# Patient Record
Sex: Female | Born: 1935
Health system: Southern US, Community
[De-identification: ages and names within clinical notes are randomized; demographics above are authoritative.]

## PROBLEM LIST (undated history)

## (undated) DIAGNOSIS — I10 Essential (primary) hypertension: Secondary | ICD-10-CM

## (undated) DIAGNOSIS — B029 Zoster without complications: Secondary | ICD-10-CM

## (undated) DIAGNOSIS — F0390 Unspecified dementia without behavioral disturbance: Secondary | ICD-10-CM

## (undated) HISTORY — PX: COLONOSCOPY: SHX174

## (undated) HISTORY — PX: SHOULDER SURGERY: SHX246

## (undated) HISTORY — DX: Unspecified dementia, unspecified severity, without behavioral disturbance, psychotic disturbance, mood disturbance, and anxiety: F03.90

---

## 2002-02-21 ENCOUNTER — Ambulatory Visit (HOSPITAL_COMMUNITY): Admission: RE | Admit: 2002-02-21 | Discharge: 2002-02-21 | Payer: Self-pay | Admitting: Internal Medicine

## 2002-02-21 ENCOUNTER — Encounter: Payer: Self-pay | Admitting: Internal Medicine

## 2002-11-25 ENCOUNTER — Emergency Department (HOSPITAL_COMMUNITY): Admission: EM | Admit: 2002-11-25 | Discharge: 2002-11-25 | Payer: Self-pay | Admitting: *Deleted

## 2002-11-25 ENCOUNTER — Encounter: Payer: Self-pay | Admitting: *Deleted

## 2002-11-29 ENCOUNTER — Ambulatory Visit (HOSPITAL_COMMUNITY): Admission: RE | Admit: 2002-11-29 | Discharge: 2002-11-29 | Payer: Self-pay | Admitting: Internal Medicine

## 2002-11-29 ENCOUNTER — Encounter: Payer: Self-pay | Admitting: Internal Medicine

## 2002-11-30 ENCOUNTER — Encounter: Payer: Self-pay | Admitting: Emergency Medicine

## 2002-12-01 ENCOUNTER — Inpatient Hospital Stay (HOSPITAL_COMMUNITY): Admission: EM | Admit: 2002-12-01 | Discharge: 2002-12-04 | Payer: Self-pay | Admitting: Emergency Medicine

## 2002-12-01 ENCOUNTER — Encounter: Payer: Self-pay | Admitting: Emergency Medicine

## 2003-01-28 ENCOUNTER — Encounter: Payer: Self-pay | Admitting: General Surgery

## 2003-01-31 ENCOUNTER — Encounter: Payer: Self-pay | Admitting: General Surgery

## 2003-01-31 ENCOUNTER — Inpatient Hospital Stay (HOSPITAL_COMMUNITY): Admission: RE | Admit: 2003-01-31 | Discharge: 2003-02-02 | Payer: Self-pay | Admitting: General Surgery

## 2003-01-31 ENCOUNTER — Encounter (INDEPENDENT_AMBULATORY_CARE_PROVIDER_SITE_OTHER): Payer: Self-pay | Admitting: Specialist

## 2003-02-01 ENCOUNTER — Encounter: Payer: Self-pay | Admitting: General Surgery

## 2003-03-15 ENCOUNTER — Ambulatory Visit (HOSPITAL_COMMUNITY): Admission: RE | Admit: 2003-03-15 | Discharge: 2003-03-15 | Payer: Self-pay | Admitting: Internal Medicine

## 2003-10-16 ENCOUNTER — Ambulatory Visit (HOSPITAL_COMMUNITY): Admission: RE | Admit: 2003-10-16 | Discharge: 2003-10-16 | Payer: Self-pay | Admitting: Internal Medicine

## 2003-10-23 ENCOUNTER — Emergency Department (HOSPITAL_COMMUNITY): Admission: EM | Admit: 2003-10-23 | Discharge: 2003-10-23 | Payer: Self-pay | Admitting: Emergency Medicine

## 2004-03-16 ENCOUNTER — Ambulatory Visit (HOSPITAL_COMMUNITY): Admission: RE | Admit: 2004-03-16 | Discharge: 2004-03-16 | Payer: Self-pay | Admitting: Obstetrics and Gynecology

## 2004-07-06 ENCOUNTER — Emergency Department (HOSPITAL_COMMUNITY): Admission: EM | Admit: 2004-07-06 | Discharge: 2004-07-06 | Payer: Self-pay | Admitting: Emergency Medicine

## 2004-07-21 ENCOUNTER — Ambulatory Visit (HOSPITAL_COMMUNITY): Admission: RE | Admit: 2004-07-21 | Discharge: 2004-07-21 | Payer: Self-pay | Admitting: Internal Medicine

## 2005-03-18 ENCOUNTER — Ambulatory Visit (HOSPITAL_COMMUNITY): Admission: RE | Admit: 2005-03-18 | Discharge: 2005-03-18 | Payer: Self-pay | Admitting: Internal Medicine

## 2006-03-25 ENCOUNTER — Ambulatory Visit (HOSPITAL_COMMUNITY): Admission: RE | Admit: 2006-03-25 | Discharge: 2006-03-25 | Payer: Self-pay | Admitting: Internal Medicine

## 2006-08-22 ENCOUNTER — Ambulatory Visit (HOSPITAL_COMMUNITY): Admission: RE | Admit: 2006-08-22 | Discharge: 2006-08-22 | Payer: Self-pay | Admitting: Internal Medicine

## 2007-02-01 ENCOUNTER — Ambulatory Visit (HOSPITAL_COMMUNITY): Admission: RE | Admit: 2007-02-01 | Discharge: 2007-02-01 | Payer: Self-pay | Admitting: Internal Medicine

## 2007-02-01 ENCOUNTER — Ambulatory Visit: Payer: Self-pay | Admitting: Internal Medicine

## 2007-03-27 ENCOUNTER — Ambulatory Visit (HOSPITAL_COMMUNITY): Admission: RE | Admit: 2007-03-27 | Discharge: 2007-03-27 | Payer: Self-pay | Admitting: Internal Medicine

## 2007-04-19 ENCOUNTER — Other Ambulatory Visit: Admission: RE | Admit: 2007-04-19 | Discharge: 2007-04-19 | Payer: Self-pay | Admitting: Obstetrics and Gynecology

## 2007-08-04 ENCOUNTER — Emergency Department (HOSPITAL_COMMUNITY): Admission: EM | Admit: 2007-08-04 | Discharge: 2007-08-04 | Payer: Self-pay | Admitting: Emergency Medicine

## 2008-03-27 ENCOUNTER — Ambulatory Visit (HOSPITAL_COMMUNITY): Admission: RE | Admit: 2008-03-27 | Discharge: 2008-03-27 | Payer: Self-pay | Admitting: Internal Medicine

## 2008-05-13 ENCOUNTER — Emergency Department (HOSPITAL_COMMUNITY): Admission: EM | Admit: 2008-05-13 | Discharge: 2008-05-13 | Payer: Self-pay | Admitting: Emergency Medicine

## 2008-05-13 ENCOUNTER — Encounter: Payer: Self-pay | Admitting: Orthopedic Surgery

## 2008-05-14 ENCOUNTER — Ambulatory Visit: Payer: Self-pay | Admitting: Orthopedic Surgery

## 2008-05-14 DIAGNOSIS — S82899A Other fracture of unspecified lower leg, initial encounter for closed fracture: Secondary | ICD-10-CM | POA: Insufficient documentation

## 2008-05-23 ENCOUNTER — Ambulatory Visit: Payer: Self-pay | Admitting: Orthopedic Surgery

## 2008-05-24 ENCOUNTER — Ambulatory Visit (HOSPITAL_COMMUNITY): Admission: RE | Admit: 2008-05-24 | Discharge: 2008-05-24 | Payer: Self-pay | Admitting: Orthopedic Surgery

## 2008-05-24 ENCOUNTER — Ambulatory Visit: Payer: Self-pay | Admitting: Orthopedic Surgery

## 2008-05-27 ENCOUNTER — Encounter: Payer: Self-pay | Admitting: Orthopedic Surgery

## 2008-05-28 ENCOUNTER — Ambulatory Visit: Payer: Self-pay | Admitting: Orthopedic Surgery

## 2008-06-05 ENCOUNTER — Ambulatory Visit: Payer: Self-pay | Admitting: Orthopedic Surgery

## 2008-07-03 ENCOUNTER — Ambulatory Visit: Payer: Self-pay | Admitting: Orthopedic Surgery

## 2008-07-03 DIAGNOSIS — M25569 Pain in unspecified knee: Secondary | ICD-10-CM

## 2008-08-05 ENCOUNTER — Ambulatory Visit: Payer: Self-pay | Admitting: Orthopedic Surgery

## 2008-09-04 ENCOUNTER — Ambulatory Visit: Payer: Self-pay | Admitting: Orthopedic Surgery

## 2008-09-04 DIAGNOSIS — IMO0002 Reserved for concepts with insufficient information to code with codable children: Secondary | ICD-10-CM

## 2008-09-06 ENCOUNTER — Encounter (INDEPENDENT_AMBULATORY_CARE_PROVIDER_SITE_OTHER): Payer: Self-pay | Admitting: *Deleted

## 2008-09-11 ENCOUNTER — Ambulatory Visit (HOSPITAL_COMMUNITY): Admission: RE | Admit: 2008-09-11 | Discharge: 2008-09-11 | Payer: Self-pay | Admitting: Orthopedic Surgery

## 2008-09-17 ENCOUNTER — Ambulatory Visit: Payer: Self-pay | Admitting: Orthopedic Surgery

## 2009-04-08 ENCOUNTER — Ambulatory Visit (HOSPITAL_COMMUNITY): Admission: RE | Admit: 2009-04-08 | Discharge: 2009-04-08 | Payer: Self-pay | Admitting: Internal Medicine

## 2009-06-13 ENCOUNTER — Other Ambulatory Visit: Admission: RE | Admit: 2009-06-13 | Discharge: 2009-06-13 | Payer: Self-pay | Admitting: Obstetrics and Gynecology

## 2009-10-31 ENCOUNTER — Emergency Department (HOSPITAL_COMMUNITY): Admission: EM | Admit: 2009-10-31 | Discharge: 2009-11-01 | Payer: Self-pay | Admitting: Emergency Medicine

## 2009-11-11 ENCOUNTER — Ambulatory Visit (HOSPITAL_COMMUNITY): Admission: RE | Admit: 2009-11-11 | Discharge: 2009-11-11 | Payer: Self-pay | Admitting: Obstetrics & Gynecology

## 2010-05-21 ENCOUNTER — Other Ambulatory Visit (HOSPITAL_COMMUNITY): Payer: Self-pay | Admitting: Internal Medicine

## 2010-05-21 DIAGNOSIS — Z139 Encounter for screening, unspecified: Secondary | ICD-10-CM

## 2010-05-28 ENCOUNTER — Ambulatory Visit (HOSPITAL_COMMUNITY)
Admission: RE | Admit: 2010-05-28 | Discharge: 2010-05-28 | Disposition: A | Discharge status: Home / Self Care | Payer: Medicare Other | Source: Ambulatory Visit | Attending: Internal Medicine | Admitting: Internal Medicine

## 2010-05-28 DIAGNOSIS — Z1231 Encounter for screening mammogram for malignant neoplasm of breast: Secondary | ICD-10-CM | POA: Insufficient documentation

## 2010-05-28 DIAGNOSIS — Z139 Encounter for screening, unspecified: Secondary | ICD-10-CM

## 2010-06-27 LAB — URINALYSIS, ROUTINE W REFLEX MICROSCOPIC
Nitrite: NEGATIVE
Protein, ur: NEGATIVE mg/dL
Specific Gravity, Urine: 1.025 (ref 1.005–1.030)
Urobilinogen, UA: 0.2 mg/dL (ref 0.0–1.0)

## 2010-06-27 LAB — URINE MICROSCOPIC-ADD ON

## 2010-06-27 LAB — WET PREP, GENITAL: Trich, Wet Prep: NONE SEEN

## 2010-06-27 LAB — GC/CHLAMYDIA PROBE AMP, GENITAL: GC Probe Amp, Genital: NEGATIVE

## 2010-07-28 LAB — GLUCOSE, CAPILLARY: Glucose-Capillary: 123 mg/dL — ABNORMAL HIGH (ref 70–99)

## 2010-07-28 LAB — HEMOGLOBIN AND HEMATOCRIT, BLOOD
HCT: 34.2 % — ABNORMAL LOW (ref 36.0–46.0)
Hemoglobin: 11.4 g/dL — ABNORMAL LOW (ref 12.0–15.0)

## 2010-07-28 LAB — BASIC METABOLIC PANEL
Chloride: 101 mEq/L (ref 96–112)
GFR calc Af Amer: 57 mL/min — ABNORMAL LOW (ref >60)
Potassium: 3.9 mEq/L (ref 3.5–5.1)

## 2010-08-25 NOTE — Op Note (Signed)
NAME:  Amy Stein, SANAGUSTIN                  ACCOUNT NO.:  [id number]   MEDICAL RECORD NO.:  [id number]          PATIENT TYPE:  AMB   LOCATION:  DAY                           FACILITY:  [facility]   PHYSICIAN:  Carole Civil, M.D.DATE OF BIRTH:  04-Sep-1933   DATE OF PROCEDURE:  05/24/2008  DATE OF DISCHARGE:                               OPERATIVE REPORT   Date of injury is May 13, 2008, mechanism fall.   HISTORY:  A 77 year old female who fell, injured her left ankle on  May 13, 2008, had x-rays at [facility] showed a slightly  displaced fibula with intact mode.  She was seen in the office on  May 23, 2008 and for the second time after been in a cast for week.  Repeat films showed that there was widening of the medial clear space  and the patient was strongly advised to have surgery versus the option  of not doing surgery and risking manner reduced mortise.  She opted for  surgery.  Informed consent process was completed in the office.   The patient was identified in the preop holding area.  Her left ankle  was marked for surgery and the cast was split.  The patient was taken to  surgery and given Ancef and was again intubated with LMA.  She was given  LMA anesthetic.   After sterile prep and drape, the time-out procedure was completed.  Her  left lower extremity was exsanguinated with an Esmarch and the  tourniquet was elevated.  A bump was placed on the left thigh.   After time-out was completed as stated.   A longitudinal incision was made over the fibula.  Subcutaneous tissue  was divided.  Subperiosteal dissection exposed the fracture.  The  fracture was comminuted, fracture was reduced, held with a clamp.  Radiographs were taken.   A 8-hole plate was then applied to the fracture in neutral position  using AO technique.   Repeat films shows that the mortise was intact, reduced along with the  fibular, which was reduced.   We then tested the syndesmosis  under fluoro and visually with external  rotation stress and it was intact.   The wound was then irrigated closed with 0-0 and 2-0 Monocryl and  staples.  We injected some Marcaine plain about 20 mL.  We placed the  patient in the posterior splint.  The foot in neutral position.   Tourniquet was released prior to application of the splint.   The patient will be treated with nonweightbearing for probably 6 weeks  due to the comminuted nature of the fracture and poor bone quality.   She will be placed in a cast in the office once the staples are removed.   She was discharged with Norco 7.5/650 with two refills, take one q. 4  p.r.n. for pain and also Phenergan 25 mg, #40, one q.6 p.r.n. for  nausea.      Carole Civil, M.D.  Electronically Signed     SEH/MEDQ  D:  05/24/2008  T:  05/25/2008  Job:  [id number]

## 2010-08-25 NOTE — Op Note (Signed)
Amy Stein, Amy Stein                  ACCOUNT NO.:  [id number]   MEDICAL RECORD NO.:  [id number]          PATIENT TYPE:  AMB   LOCATION:  DAY                           FACILITY:  [facility]   PHYSICIAN:  R. Garfield Cornea, M.D. DATE OF BIRTH:  1933-09-11   DATE OF PROCEDURE:  02/01/2007  DATE OF DISCHARGE:                               OPERATIVE REPORT   PROCEDURE:  Screening colonoscopy.   INDICATIONS FOR PROCEDURE:  A 77 year old lady devoid of any lower GI  tract symptoms, sent over by Dr. Legrand Rams for colorectal cancer screening.  Last colonoscopy some 20 years ago in [place].  This was done for  apparently routine screening.  She has no lower GI tract symptoms.  No  family history colon cancer.  Colonoscopy is now being done as standard  screening maneuver.  This approach has been discussed with the patient  at length.  Potential risks, benefits and alternatives have been  reviewed, questions answered.  She is agreeable.  Please see  documentation in the medical record.   PROCEDURE NOTE:  O2 saturation, blood pressure, pulse and respirations  were monitored the entire procedure.   CONSCIOUS SEDATION:  Versed 3 mg IV, Demerol 75 mg IV in divided doses.   INSTRUMENT:  Pentax video chip system.   FINDINGS:  Digital rectal exam revealed no abnormalities.   ENDOSCOPIC FINDINGS:  The prep was adequate.  Colon:  Colonic mucosa was surveyed from rectosigmoid junction through  the left, transverse, right colon to area of appendiceal orifice,  ileocecal valve and cecum.  These structures well seen and photographed  for the record.  From this level scope was slowly withdrawn.  All  previously mentioned  mucosal surfaces were again seen.  The colonic  mucosa appeared normal.  Scope was pulled down to the rectum.  Thorough  examination of rectal mucosa including retroflex view of anal verge  demonstrated no abnormalities.  The patient tolerated the procedure  well, was reacted in endoscopy.   IMPRESSION:  1. Normal rectum.  2. Normal colon.   RECOMMENDATIONS:  Consider repeat screening colonoscopy 10 years.      Bridgette Habermann, M.D.  Electronically Signed     RMR/MEDQ  D:  02/01/2007  T:  02/02/2007  Job:  [id number]   cc:   Tesfaye D. Legrand Rams, MD  Fax: [phone]

## 2010-08-28 NOTE — Op Note (Signed)
Amy Stein, Amy Stein                            ACCOUNT NO.:  [id number]   MEDICAL RECORD NO.:  [id number]                   PATIENT TYPE:  OIB   LOCATION:  2899                                 FACILITY:  [facility]   PHYSICIAN:  Merri Ray. Grandville Silos, M.D.             DATE OF BIRTH:  Jan 18, 1934   DATE OF PROCEDURE:  01/31/2003  DATE OF DISCHARGE:                                 OPERATIVE REPORT   PREOPERATIVE DIAGNOSIS:  Cholelithiasis.   POSTOPERATIVE DIAGNOSIS:  1. Chronic cholecystitis.  2. Choledocholithiasis.   OPERATION PERFORMED:  Laparoscopic cholecystectomy with intraoperative  cholangiogram.   SURGEON:  Merri Ray. Grandville Silos, M.D.   ASSISTANT:  Odis Hollingshead, M.D.   ANESTHESIA:  General.   INDICATIONS FOR PROCEDURE:  The patient is a 77 year old African-American  female whom I admitted back in August with an episode of cholecystitis.  She  was seven or eight days into the episode at that time, so she was cooled off  on intravenous antibiotics and bowel rest and sent home.  She was scheduled  for elective cholecystectomy today.   DESCRIPTION OF PROCEDURE:  Informed consent was obtained.  The patient  received intravenous antibiotics.  She was brought to the operating room.  General anesthesia was administered.  Her abdomen was prepped and draped in  sterile fashion.  A midline supraumbilical incision was made.  Subcutaneous  tissues were dissected down.  Her anterior fascia was divided.  Her  peritoneal cavity was entered under direct vision without difficulty.  A  pursestring suture of  0 Vicryl was placed around the fascial opening.  A  Hasson trocar was inserted and the abdomen was insufflated with carbon  dioxide in the standard fashion.  An 11 mm epigastric and two 5 mm lateral  ports were placed under direct vision.  The dome of the gallbladder was  retracted superomedially.  There was a large amount of omental adhesions to  the gallbladder.  These were stripped  down from the dome.  They became quite  dense down toward the infundibulum.  The infundibulum was grasped and  retracted inferolaterally.  The dissection was begun laterally and  progressed medially coming through a lot of dense adhesions with slow  careful progress. We were eventually able to identify an anterior branch of  the cystic artery and this was clipped twice proximally, once distally and  divided.  Further dissection eventually revealed the cystic duct.  The  common bile duct was also visible and this appeared to be dilated.  The  cystic duct was circumferentially dissected and once a good window was made  between the infundibulum and cystic duct and the liver, a clip was placed on  the cystic duct at the infundibulocystic duct junction.  A small nick was  made in the cystic duct.  A little bit of stone fragment debris came out. A  Reddick cholangiogram catheter was  then inserted and an intraoperative  cholangiogram was obtained which demonstrated a filling defect consistent  with a stone in the distal common bile duct.  There was no bile evacuating  into the duodenum.  Cholangiogram was completed as the stone appeared to be  too large to flush through and it did not move with some pressure applied  with the cholangiogram.  Once this was accomplished, cholangiogram catheter  was removed and three clips were placed proximally on the cystic duct and it  was divided.  Further dissection revealed the posterior branch of the cystic  artery that was dissected, circumferentially clipped, twice proximally, once  distally and divided.  The gallbladder was then taken off of the liver bed  using Bovie cautery.  It was intrahepatic and the posterior wall and a  portion was fused to the liver.  The gallbladder was falling apart and it  was deemed safest to leave that small portion of the back wall in place. The  rest of the gallbladder was excised.  Several stones were removed with the  stone  scooper and the gallbladder was taken off the liver bed, placed in an  EndoCatch bag and removed via the supraumbilical port site.  Some spilled  stones were then retrieved with the stone scooper.  The abdomen was  copiously irrigated.  The remaining mucosa on the small portion of the  gallbladder back wall was cauterized with high powered Bovie.  The Bovie was  then used to get hemostasis in the liver bed.  Once this was accomplished, a  piece of Surgicel was placed down into the liver bed into the gallbladder  fossa which had appeared hemostatic.  The abdomen was copiously irrigated  with several liters of saline.  The irrigant returned clear.  All residual  stones and stone fragments were retrieved and the liver bed area was checked  again and no bleeding was noted. The trocars were removed under direct  vision.  The pneumoperitoneum was released.  The Hasson trocar was removed.  The umbilical port site was closed by first tying the 0 Vicryl pursestring  suture in the fascia.  All four wounds were copiously irrigated.  0.25%  Marcaine with epinephrine had been used for local anesthetic and the skin  was closed in each with a running Vicryl subcuticular stitch.  Sponge,  needle and instrument counts were correct.  Benzoin and Steri-Strips and  sterile dressings were applied.  The patient tolerated the procedure without  complication and was taken to recovery room in stable condition.  Once the  patient was in the recovery room, I consulted Jeryl Columbia, M.D. from  gastroenterology to evaluate the patient for possible ERCP tomorrow.                                                Merri Ray Grandville Silos, M.D.    BET/MEDQ  D:  01/31/2003  T:  01/31/2003  Job:  [id number]

## 2010-08-28 NOTE — Op Note (Signed)
NAMETIMORA, CLOPP                            ACCOUNT NO.:  [id number]   MEDICAL RECORD NO.:  [id number]                   PATIENT TYPE:  INP   LOCATION:  5737                                 FACILITY:  [facility]   PHYSICIAN:  Jeryl Columbia, M.D.                 DATE OF BIRTH:  1933/11/03   DATE OF PROCEDURE:  02/01/2003  DATE OF DISCHARGE:                                 OPERATIVE REPORT   PROCEDURE:  ERCP, sphincterotomy, balloon pull through.   INDICATIONS FOR PROCEDURE:  Probable CBD stone, positive intraoperative  cholangiogram.  Consent was signed after risks, benefits, methods, options  were thoroughly discussed with the patient today and multiple family members  yesterday.   PREMEDICATION:  Demerol 70, Versed 8, Glucagon 0.5 for some increased  spasms.   DESCRIPTION OF PROCEDURE:  Side viewing therapeutic video duodenoscope was  inserted by indirect vision into the stomach, advanced through a normal  antrum, normal pyloris, and a normal appearing ampulla was brought into  view.  Using the triple lumen sphincterotome, we initially obtained a  pancreatogram which was normal. We did not overfill the PD. The  sphincterotome was repositioned and we were able to fill the CPD. We seemed  to have difficulty passing the wire, although, no stone was seen.  The scope  did overlie the bottom of the CBD which made distal CBD visualization  difficult at this juncture in the procedure.  In trying to cannulate with  the JAG wire, the wire did one time go partially up the pancreas. We were  able to reposition the sphincterotome and were able to get deep selective  cannulation.  The JAG wire was advanced into the intrahepatic.  On initial  cholangiogram, no obvious CBD stone was seen, but again we could not  manipulate the scope in order to clear the distal duct. We went ahead and  proceeded with a moderate size sphincterotomy over the wire in the customary  fashion until adequate biliary  drainage was seen and we were able to get the  half bowed sphincterotome in and out of the duct. We elected not to cut the  duct any further at this junction based on landmarks and scope position.  We  went ahead and exchanged this sphincterotome with an 8.5 mm balloon and  proceeded with multiple balloon pull throughs.  Both the wire and the  balloon popped on the first pull through, although, no stone was delivered.  From then on, the balloons passed fairly readily through the ampulla. There  was excellent biliary drainage, although, no stones were seen.  After  approximately four balloon pull throughs, we went ahead and proceeded with  two occlusion cholangiograms which appeared normal and had good biliary  drainage. The scope was removed. The patient tolerated the procedure well  and there was no obvious immediate complications.   ENDOSCOPIC ASSESSMENT:  1.  Normal appearing ampulla.  2. Normal PD on one injection and one time advancing the wire partially into     the PD.  3. Difficult to assess the distal duct secondary to scope position.  4. Normal upper CBD and bifurcation.  5. Status post moderate size sphincterotomy.  6. Multiple balloon pull throughs using the 8.5 mm balloon and negative     occlusion cholangiogram x2, and normal drainage, although, no obvious     stone delivered.    PLAN:  Observe for delayed complications.  If none, probably discharge  tomorrow.  Will recheck labs.  Happy to proceed with repeat ERCP and double  check for stones if signs or symptoms of residual stone exists in the  future.  Happy to see back p.r.n.                                               Jeryl Columbia, M.D.    MEM/MEDQ  D:  02/01/2003  T:  02/01/2003  Job:  [id number]   cc:   Merri Ray. Grandville Silos, M.D.  [facility]  [address] [place], [place] [address]  Fax: [phone]   Tesfaye D. Legrand Rams, M.D.  [address]  [place]  [place] [address]  Fax: [phone]

## 2010-08-28 NOTE — Discharge Summary (Signed)
   Amy Stein, Amy Stein                            ACCOUNT NO.:  [id number]   MEDICAL RECORD NO.:  [id number]                   PATIENT TYPE:  INP   LOCATION:  5737                                 FACILITY:  [facility]   PHYSICIAN:  Merri Ray. Grandville Silos, M.D.             DATE OF BIRTH:  10-31-1933   DATE OF ADMISSION:  01/31/2003  DATE OF DISCHARGE:  02/02/2003                                 DISCHARGE SUMMARY   DISCHARGE DIAGNOSES:  1. Status post laparoscopic cholecystectomy and intraoperative     cholangiogram.  2. Choledocholithiasis.   HISTORY OF PRESENT ILLNESS:  The patient is a 77 year old African American  female who was scheduled for an elective cholecystectomy.   HOSPITAL COURSE:  The patient underwent laparoscopic cholecystectomy with  intraoperative cholangiogram which revealed a distal common bile duct stone.  Postoperatively, she underwent ERCP by Dr. Jeryl Columbia, which demonstrated  a clear common bile duct.  Her LFTs returned towards normal, but they had  been mildly elevated postoperatively.  She tolerated gradual advancement of  her diet and she was afebrile and hemodynamically stable and was discharged  home in stable condition on postoperative day 2 on February 02, 2003.   DISCHARGE DIET:  Low-fat diabetic diet.   DISCHARGE ACTIVITIES:  No lifting.   DISCHARGE MEDICATIONS:  Discharge medications as previously, plus Tylox  5/325 mg one to two p.o. q.6 h. p.r.n. pain.   FOLLOWUP:  Followup is with Dr. Merri Ray. Thompson in three weeks.                                                Merri Ray Grandville Silos, M.D.    BET/MEDQ  D:  02/13/2003  T:  02/14/2003  Job:  [id number]

## 2010-08-28 NOTE — Discharge Summary (Signed)
   Amy Stein, MCCLARIN                            ACCOUNT NO.:  [id number]   MEDICAL RECORD NO.:  [id number]                   PATIENT TYPE:  INP   LOCATION:  5705                                 FACILITY:  [facility]   PHYSICIAN:  Merri Ray. Grandville Silos, M.D.             DATE OF BIRTH:  [birth year]   DATE OF ADMISSION:  11/30/2002  DATE OF DISCHARGE:  12/04/2002                                 DISCHARGE SUMMARY   DISCHARGE DIAGNOSIS:  Acute cholecystitis.   HISTORY OF PRESENT ILLNESS:  The patient is a 77 year old African-American  female with a history of non-insulin dependent diabetes and hypertension who  developed right upper quadrant pain nearly a week prior to presentation to  the hospital. She visited the emergency department a couple of days into the  pain and was diagnosed with constipation. She was further evaluated by her  primary medical doctor who ordered an ultrasound on Thursday, but she did  not receive results from that. She presented to the emergency department  with an acute cholecystitis, which was seen on CT scan of the abdomen. She  was admitted.   HOSPITAL COURSE:  Due to the patient's time, being seven days into acute  cholecystitis, she would likely have required open surgery, so we elected to  treat her with antibiotics and bowel rest. She initially had one fever  spike, but tolerated her IV and Cefotetan well. Had some low blood sugars  into the 50s, but her pain soon resolved and by August 23 she began clear  liquids and her hypoglycemia resolved. This was gradually advanced to low-  fat. She had complete resolution of her pain and her pain did not return  when her diet was advanced. On the day of discharge she has no tenderness in  her right upper quadrant. She is afebrile and doing very well.   DISCHARGE DIAGNOSES:  Acute cholecystitis.   DISCHARGE MEDICATIONS:  1. Cipro 500 mg p.o. b.i.d. for seven days.  2. Vicodin 5/500 one p.o. q.6h. p.r.n. pain.   ACTIVITY:  As tolerated.   DIET:  Low-fat, diabetic diet strictly.   DISCHARGE FOLLOW UP:  With me, Dr. Grandville Silos in three weeks. At that time we  will plan to scheduled elective cholecystectomy.                                                Merri Ray Grandville Silos, M.D.    BET/MEDQ  D:  12/04/2002  T:  12/05/2002  Job:  [id number]

## 2010-08-28 NOTE — H&P (Signed)
NAMEJASHLEY, Amy Stein                            ACCOUNT NO.:  [id number]   MEDICAL RECORD NO.:  [id number]                   PATIENT TYPE:  INP   LOCATION:  5705                                 FACILITY:  [facility]   PHYSICIAN:  Merri Ray. Grandville Silos, M.D.             DATE OF BIRTH:  03/09/1934   DATE OF ADMISSION:  11/30/2002  DATE OF DISCHARGE:                                HISTORY & PHYSICAL   CHIEF COMPLAINT:  Right upper quadrant pain.   HISTORY OF PRESENT ILLNESS:  The patient is a 77 year old, African-American  female with a history of a noninsulin-dependent diabetes and hypertension,  who developed right upper quadrant pain six days ago.  She went to the  emergency department at that time and was diagnosed with constipation and  discharged.  The pain continued ever since.  She saw her primary MD, who  ordered a right upper quadrant ultrasound on Thursday.  She never received a  report of that.  The pain continued, and she came to the emergency  department during the night last night.  Workup there included CT scan of  the abdomen and pelvis, which showed acute cholecystitis.  Her pain has  eased off somewhat since then.   PAST MEDICAL HISTORY:  1. Noninsulin-dependent diabetes mellitus.  2. Hypertension.   PAST SURGICAL HISTORY:  Ruptured pelvic cyst in 1969.   MEDICATIONS:  1. Ranitidine 150 mg p.o. q.d.  2. Verapamil 240 mg p.o. q.d.  3. Glucovance 250/500, 1 p.o. b.i.d.  4. Amitriptyline 50 mg p.o. q.d.  5. Hydrochlorothiazide 25 mg p.o. q.d.   ALLERGIES:  SULFA.   REVIEW OF SYSTEMS:  GENERAL:  Negative.  CARDIOVASCULAR:  Negative.  PULMONARY:  Negative.  GASTROINTESTINAL:  Please see the history of present  illness.  MUSCULOSKELETAL:  Negative.  NEUROLOGIC/PSYCHIATRIC:  Negative.  The remainder of the review of systems was negative.   PHYSICAL EXAMINATION:  VITAL SIGNS:  Temperature 99.8, heart rate 96,  respirations 20, blood pressure 124/68, saturations 96% on 2  L.  GENERAL:  She is awake, alert and not in acute distress.  EYES:  Sclerae are nonicteric.  Pupils are equal.  NECK:  Supple.  LUNGS:  Clear to auscultation bilaterally.  HEART:  Regular rate and rhythm.  PMI is palpable in the left chest.  Distal  pulses are 1+ and equal bilaterally.  ABDOMEN:  Her abdomen is soft and nondistended.  She does have some right  upper quadrant tenderness to palpation with palpable fullness in that area.  Remainder of her abdomen is nontender.  SKIN:  Warm and dry with no rashes.  EXTREMITIES:  No significant peripheral edema.   LABORATORY DATA REVIEWED:  Includes white blood cell count of 17.5,  hemoglobin 12.3, hematocrit 37, platelets 260.  Chemistries:  Sodium 130,  potassium 2.9, chloride 89, CO2 30, BUN 25, creatinine 2.1, glucose 156; AST  127, ALT 133,  alk phos 262, bilirubin of 1.4.  CT scan of the abdomen showed  acute cholecystitis.   IMPRESSION:  Acute cholecystitis.   PLAN:  Will be to admit, keep the patient NPO, start IV fluids, IV  antibiotics.  We will check some followup LFTs tomorrow.  Unfortunately,  this can be a dangerous time to operate with high expense of conversion to  an open procedure, as the patient is almost a week into her symptoms.  It  would be ideal to be able to cool things off with a few days of IV  antibiotics with bowel rest, and proceed with cholecystectomy electively,  but that may not be possible in light of the patient's diabetes.  I will  discuss things with Dr. Harlow Asa, who is on call today from our service, and  we will re-evaluate for clinical changes and plan accordingly.                                                Merri Ray Grandville Silos, M.D.    BET/MEDQ  D:  12/01/2002  T:  12/02/2002  Job:  [id number]

## 2010-08-28 NOTE — Consult Note (Signed)
   Amy Stein, Amy Stein                            ACCOUNT NO.:  [id number]   MEDICAL RECORD NO.:  [id number]                   PATIENT TYPE:  OIB   LOCATION:  [id number]                                 FACILITY:  [facility]   PHYSICIAN:  Jeryl Columbia, M.D.                 DATE OF BIRTH:  [birth year]   DATE OF CONSULTATION:  01/31/2003  DATE OF DISCHARGE:                                   CONSULTATION   HISTORY:  The patient is seen at the request of Merri Ray. Grandville Silos, M.D. for  an abnormal intraoperative cholangiogram.  The patient had about two months  acute cholecystitis treated with antibiotics and brought back for elective  cholecystectomy.  She had been asymptomatic.  Underwent her surgery but on  intraoperative cholangiogram the stone was seen and I was consulted for  further work-up and plans.  Liver tests at the time of her acute  cholecystitis were mildly elevated.  With antibiotics the level seemed to  decrease.  I do not have preoperative laboratories.   PAST MEDICAL HISTORY:  1. Non-insulin-dependent diabetes.  2. Hypertension.  3. Ruptured pelvic cyst surgery 1969.   MEDICATIONS:  1. Zantac.  2. ___________.  3. Glucovance.  4. Amitriptyline.  5. Hydrochlorothiazide.   ALLERGIES:  SULFA.   FAMILY HISTORY:  Noncontributory.   REVIEW OF SYSTEMS:  Negative except above.   PHYSICAL EXAMINATION:  GENERAL:  The patient is status post surgery, lying  comfortably asleep.  Not examined today.  Will be prior to ERCP.   LABORATORIES:  Intraoperative cholangiogram reviewed, consistent with stone  as above.  No new liver tests on the computer.   ASSESSMENT:  Positive intraoperative cholangiogram.   PLAN:  The risks, benefits, methods of ERCP, sphincterotomy, and stone  extraction were discussed with multiple family members and will proceed ASAP  or tomorrow at 11 a.m. with further work-up and plans pending those  findings.                                              Jeryl Columbia, M.D.   MEM/MEDQ  D:  01/31/2003  T:  01/31/2003  Job:  [id number]

## 2010-09-01 ENCOUNTER — Other Ambulatory Visit: Payer: Self-pay | Admitting: Obstetrics & Gynecology

## 2010-09-01 ENCOUNTER — Other Ambulatory Visit (HOSPITAL_COMMUNITY)
Admission: RE | Admit: 2010-09-01 | Discharge: 2010-09-01 | Disposition: A | Discharge status: Home / Self Care | Payer: Medicare Other | Source: Ambulatory Visit | Attending: Obstetrics & Gynecology | Admitting: Obstetrics & Gynecology

## 2010-09-01 DIAGNOSIS — Z124 Encounter for screening for malignant neoplasm of cervix: Secondary | ICD-10-CM | POA: Insufficient documentation

## 2011-04-26 ENCOUNTER — Other Ambulatory Visit (HOSPITAL_COMMUNITY): Payer: Self-pay | Admitting: Podiatry

## 2011-04-26 DIAGNOSIS — Z139 Encounter for screening, unspecified: Secondary | ICD-10-CM

## 2011-04-28 DIAGNOSIS — E119 Type 2 diabetes mellitus without complications: Secondary | ICD-10-CM | POA: Diagnosis not present

## 2011-04-28 DIAGNOSIS — E1149 Type 2 diabetes mellitus with other diabetic neurological complication: Secondary | ICD-10-CM | POA: Diagnosis not present

## 2011-04-29 ENCOUNTER — Other Ambulatory Visit: Payer: Self-pay | Admitting: Obstetrics & Gynecology

## 2011-04-29 DIAGNOSIS — A5424 Gonococcal female pelvic inflammatory disease: Secondary | ICD-10-CM | POA: Diagnosis not present

## 2011-04-29 DIAGNOSIS — N950 Postmenopausal bleeding: Secondary | ICD-10-CM | POA: Diagnosis not present

## 2011-05-17 DIAGNOSIS — N950 Postmenopausal bleeding: Secondary | ICD-10-CM | POA: Diagnosis not present

## 2011-05-31 ENCOUNTER — Ambulatory Visit (HOSPITAL_COMMUNITY)
Admission: RE | Admit: 2011-05-31 | Discharge: 2011-05-31 | Disposition: A | Discharge status: Home / Self Care | Payer: Medicare Other | Source: Ambulatory Visit | Attending: Podiatry | Admitting: Podiatry

## 2011-05-31 DIAGNOSIS — Z1231 Encounter for screening mammogram for malignant neoplasm of breast: Secondary | ICD-10-CM | POA: Insufficient documentation

## 2011-05-31 DIAGNOSIS — Z139 Encounter for screening, unspecified: Secondary | ICD-10-CM

## 2011-06-03 ENCOUNTER — Other Ambulatory Visit: Payer: Self-pay | Admitting: Podiatry

## 2011-06-03 DIAGNOSIS — R928 Other abnormal and inconclusive findings on diagnostic imaging of breast: Secondary | ICD-10-CM

## 2011-06-08 ENCOUNTER — Other Ambulatory Visit: Payer: Self-pay | Admitting: Internal Medicine

## 2011-06-08 DIAGNOSIS — R928 Other abnormal and inconclusive findings on diagnostic imaging of breast: Secondary | ICD-10-CM

## 2011-06-15 DIAGNOSIS — H2510 Age-related nuclear cataract, unspecified eye: Secondary | ICD-10-CM | POA: Diagnosis not present

## 2011-06-15 DIAGNOSIS — H26019 Infantile and juvenile cortical, lamellar, or zonular cataract, unspecified eye: Secondary | ICD-10-CM | POA: Diagnosis not present

## 2011-06-15 DIAGNOSIS — E119 Type 2 diabetes mellitus without complications: Secondary | ICD-10-CM | POA: Diagnosis not present

## 2011-06-16 ENCOUNTER — Other Ambulatory Visit: Payer: Self-pay | Admitting: Internal Medicine

## 2011-06-16 ENCOUNTER — Other Ambulatory Visit: Payer: Self-pay | Admitting: Podiatry

## 2011-06-16 ENCOUNTER — Ambulatory Visit (HOSPITAL_COMMUNITY)
Admission: RE | Admit: 2011-06-16 | Discharge: 2011-06-16 | Disposition: A | Discharge status: Home / Self Care | Payer: Medicare Other | Source: Ambulatory Visit | Attending: Podiatry | Admitting: Podiatry

## 2011-06-16 DIAGNOSIS — R921 Mammographic calcification found on diagnostic imaging of breast: Secondary | ICD-10-CM

## 2011-06-16 DIAGNOSIS — R928 Other abnormal and inconclusive findings on diagnostic imaging of breast: Secondary | ICD-10-CM | POA: Diagnosis not present

## 2011-06-29 ENCOUNTER — Ambulatory Visit
Admission: RE | Admit: 2011-06-29 | Discharge: 2011-06-29 | Disposition: A | Discharge status: Home / Self Care | Payer: Medicare Other | Source: Ambulatory Visit | Attending: Internal Medicine | Admitting: Internal Medicine

## 2011-06-29 DIAGNOSIS — D249 Benign neoplasm of unspecified breast: Secondary | ICD-10-CM | POA: Diagnosis not present

## 2011-06-29 DIAGNOSIS — R920 Mammographic microcalcification found on diagnostic imaging of breast: Secondary | ICD-10-CM | POA: Diagnosis not present

## 2011-06-29 DIAGNOSIS — R921 Mammographic calcification found on diagnostic imaging of breast: Secondary | ICD-10-CM

## 2011-06-29 DIAGNOSIS — N641 Fat necrosis of breast: Secondary | ICD-10-CM | POA: Diagnosis not present

## 2011-07-07 DIAGNOSIS — E1149 Type 2 diabetes mellitus with other diabetic neurological complication: Secondary | ICD-10-CM | POA: Diagnosis not present

## 2011-07-07 DIAGNOSIS — E119 Type 2 diabetes mellitus without complications: Secondary | ICD-10-CM | POA: Diagnosis not present

## 2011-09-15 DIAGNOSIS — E1149 Type 2 diabetes mellitus with other diabetic neurological complication: Secondary | ICD-10-CM | POA: Diagnosis not present

## 2011-09-15 DIAGNOSIS — E119 Type 2 diabetes mellitus without complications: Secondary | ICD-10-CM | POA: Diagnosis not present

## 2011-09-23 ENCOUNTER — Emergency Department (HOSPITAL_COMMUNITY)
Admission: EM | Admit: 2011-09-23 | Discharge: 2011-09-23 | Disposition: A | Discharge status: Home / Self Care | Payer: Medicare Other | Attending: Emergency Medicine | Admitting: Emergency Medicine

## 2011-09-23 ENCOUNTER — Encounter (HOSPITAL_COMMUNITY): Payer: Self-pay

## 2011-09-23 DIAGNOSIS — L309 Dermatitis, unspecified: Secondary | ICD-10-CM

## 2011-09-23 DIAGNOSIS — I10 Essential (primary) hypertension: Secondary | ICD-10-CM | POA: Diagnosis not present

## 2011-09-23 DIAGNOSIS — E119 Type 2 diabetes mellitus without complications: Secondary | ICD-10-CM | POA: Diagnosis not present

## 2011-09-23 DIAGNOSIS — R220 Localized swelling, mass and lump, head: Secondary | ICD-10-CM | POA: Insufficient documentation

## 2011-09-23 DIAGNOSIS — L259 Unspecified contact dermatitis, unspecified cause: Secondary | ICD-10-CM | POA: Diagnosis not present

## 2011-09-23 HISTORY — DX: Essential (primary) hypertension: I10

## 2011-09-23 MED ORDER — METHYLPREDNISOLONE SODIUM SUCC 125 MG IJ SOLR
125.0000 mg | Freq: Once | INTRAMUSCULAR | Status: AC
  Administered 2011-09-23: 125 mg via INTRAMUSCULAR
  Filled 2011-09-23: qty 2

## 2011-09-23 NOTE — Discharge Instructions (Signed)
Follow up with dr. Legrand Rams next week

## 2011-09-23 NOTE — ED Notes (Signed)
Pt has redness and swelling to her face and eyes since waking up this am. Denies use of any new products or medications. No respiratory complaints. Alert and oriented x 3. Skin warm and dry. Color pink. No acute distress.

## 2011-09-23 NOTE — ED Provider Notes (Signed)
History   This chart was scribed for Amy Diego, MD by Carolyne Littles. The patient was seen in room APA07/APA07. Patient's care was started at 1041.    CSN: [id number]  Arrival date & time 09/23/11  1041   First MD Initiated Contact with Patient 09/23/11 1112      Chief Complaint  Patient presents with  . Facial Swelling    (Consider location/radiation/quality/duration/timing/severity/associated sxs/prior treatment) HPI Amy Stein is a 78 y.o. female who presents to the Emergency Department complaining of constant moderate red rash and facial swelling to bilateral cheeks onset this morning and persistent since with associated pruritus. Denies SOB, dyspnea, difficulty swallowing, chest pain, HA, nausea, vomiting. Patient with h/o HTN and diabetes.   PCP- Legrand Rams  Past Medical History  Diagnosis Date  . Hypertension   . Diabetes mellitus     History reviewed. No pertinent past surgical history.  No family history on file.  History  Substance Use Topics  . Smoking status: Not on file  . Smokeless tobacco: Not on file  . Alcohol Use: No    OB History    Grav Para Term Preterm Abortions TAB SAB Ect Mult Living                  Review of Systems  Constitutional: Negative for fatigue.  HENT: Positive for facial swelling. Negative for congestion, sinus pressure and ear discharge.   Eyes: Negative for discharge.  Respiratory: Negative for cough.   Cardiovascular: Negative for chest pain.  Gastrointestinal: Negative for abdominal pain and diarrhea.  Genitourinary: Negative for frequency and hematuria.  Musculoskeletal: Negative for back pain.  Skin: Negative for rash.       Pruritus.   Neurological: Negative for seizures and headaches.  Hematological: Negative.   Psychiatric/Behavioral: Negative for hallucinations.    Allergies  Sulfonamide derivatives  Home Medications  No current outpatient prescriptions on file.  BP 171/95  Pulse 92  Temp 97.6 F (36.4  C)  Resp 18  Ht 5\' 2"  (1.575 m)  Wt 173 lb (78.472 kg)  BMI 31.64 kg/m2  SpO2 99%  Physical Exam  Nursing note and vitals reviewed. Constitutional: She is oriented to person, place, and time. She appears well-developed and well-nourished. No distress.  HENT:  Head: Normocephalic and atraumatic.  Mouth/Throat: Oropharynx is clear and moist.       Erythematous rash across nose and both cheeks.  Eyes: EOM are normal. Pupils are equal, round, and reactive to light.  Neck: Neck supple. No tracheal deviation present.  Cardiovascular: Normal rate and regular rhythm.  Exam reveals no gallop and no friction rub.   No murmur heard. Pulmonary/Chest: Effort normal. No respiratory distress. She has no wheezes. She has no rales.  Abdominal: Soft. She exhibits no distension.  Musculoskeletal: Normal range of motion. She exhibits no edema.  Neurological: She is alert and oriented to person, place, and time. No sensory deficit.  Skin: Skin is warm and dry. Rash noted.       See HENT exam.  Psychiatric: She has a normal mood and affect. Her behavior is normal.    ED Course  Procedures (including critical care time)  DIAGNOSTIC STUDIES: Oxygen Saturation is 99% on room air, normal by my interpretation.    COORDINATION OF CARE: 11:18AM-Patient informed of current plan for treatment and evaluation and agrees with plan at this time. Advised to follow up with Dr. Basilio Cairo Reviewed - No data to display No results  found.   No diagnosis found.    MDM       The chart was scribed for me under my direct supervision.  I personally performed the history, physical, and medical decision making and all procedures in the evaluation of this patient.Amy Diego, MD 09/23/11 1147

## 2011-09-23 NOTE — ED Notes (Signed)
Pt states she woke up with her face swollen. No resp problems noted

## 2011-09-28 DIAGNOSIS — L259 Unspecified contact dermatitis, unspecified cause: Secondary | ICD-10-CM | POA: Diagnosis not present

## 2011-09-28 DIAGNOSIS — I10 Essential (primary) hypertension: Secondary | ICD-10-CM | POA: Diagnosis not present

## 2011-09-28 DIAGNOSIS — E119 Type 2 diabetes mellitus without complications: Secondary | ICD-10-CM | POA: Diagnosis not present

## 2011-11-24 DIAGNOSIS — E119 Type 2 diabetes mellitus without complications: Secondary | ICD-10-CM | POA: Diagnosis not present

## 2011-11-24 DIAGNOSIS — E1149 Type 2 diabetes mellitus with other diabetic neurological complication: Secondary | ICD-10-CM | POA: Diagnosis not present

## 2012-01-04 DIAGNOSIS — E669 Obesity, unspecified: Secondary | ICD-10-CM | POA: Diagnosis not present

## 2012-01-04 DIAGNOSIS — I10 Essential (primary) hypertension: Secondary | ICD-10-CM | POA: Diagnosis not present

## 2012-01-04 DIAGNOSIS — K279 Peptic ulcer, site unspecified, unspecified as acute or chronic, without hemorrhage or perforation: Secondary | ICD-10-CM | POA: Diagnosis not present

## 2012-01-04 DIAGNOSIS — E119 Type 2 diabetes mellitus without complications: Secondary | ICD-10-CM | POA: Diagnosis not present

## 2012-02-23 ENCOUNTER — Other Ambulatory Visit (HOSPITAL_COMMUNITY): Payer: Self-pay | Admitting: Internal Medicine

## 2012-02-23 DIAGNOSIS — Z139 Encounter for screening, unspecified: Secondary | ICD-10-CM

## 2012-03-22 DIAGNOSIS — E1149 Type 2 diabetes mellitus with other diabetic neurological complication: Secondary | ICD-10-CM | POA: Diagnosis not present

## 2012-03-22 DIAGNOSIS — E119 Type 2 diabetes mellitus without complications: Secondary | ICD-10-CM | POA: Diagnosis not present

## 2012-03-29 DIAGNOSIS — E119 Type 2 diabetes mellitus without complications: Secondary | ICD-10-CM | POA: Diagnosis not present

## 2012-03-29 DIAGNOSIS — I10 Essential (primary) hypertension: Secondary | ICD-10-CM | POA: Diagnosis not present

## 2012-03-29 DIAGNOSIS — E669 Obesity, unspecified: Secondary | ICD-10-CM | POA: Diagnosis not present

## 2012-03-29 DIAGNOSIS — K279 Peptic ulcer, site unspecified, unspecified as acute or chronic, without hemorrhage or perforation: Secondary | ICD-10-CM | POA: Diagnosis not present

## 2012-05-22 DIAGNOSIS — Z01419 Encounter for gynecological examination (general) (routine) without abnormal findings: Secondary | ICD-10-CM | POA: Diagnosis not present

## 2012-05-22 DIAGNOSIS — Z1212 Encounter for screening for malignant neoplasm of rectum: Secondary | ICD-10-CM | POA: Diagnosis not present

## 2012-05-22 DIAGNOSIS — M542 Cervicalgia: Secondary | ICD-10-CM | POA: Diagnosis not present

## 2012-05-22 DIAGNOSIS — Z124 Encounter for screening for malignant neoplasm of cervix: Secondary | ICD-10-CM | POA: Diagnosis not present

## 2012-05-22 DIAGNOSIS — Z0000 Encounter for general adult medical examination without abnormal findings: Secondary | ICD-10-CM | POA: Diagnosis not present

## 2012-05-31 DIAGNOSIS — E119 Type 2 diabetes mellitus without complications: Secondary | ICD-10-CM | POA: Diagnosis not present

## 2012-05-31 DIAGNOSIS — E1149 Type 2 diabetes mellitus with other diabetic neurological complication: Secondary | ICD-10-CM | POA: Diagnosis not present

## 2012-06-30 ENCOUNTER — Ambulatory Visit (HOSPITAL_COMMUNITY): Payer: Medicare Other

## 2012-07-20 DIAGNOSIS — I10 Essential (primary) hypertension: Secondary | ICD-10-CM | POA: Diagnosis not present

## 2012-07-20 DIAGNOSIS — E119 Type 2 diabetes mellitus without complications: Secondary | ICD-10-CM | POA: Diagnosis not present

## 2012-07-21 ENCOUNTER — Ambulatory Visit (HOSPITAL_COMMUNITY)
Admission: RE | Admit: 2012-07-21 | Discharge: 2012-07-21 | Disposition: A | Discharge status: Home / Self Care | Payer: Medicare Other | Source: Ambulatory Visit | Attending: Internal Medicine | Admitting: Internal Medicine

## 2012-07-21 DIAGNOSIS — Z1231 Encounter for screening mammogram for malignant neoplasm of breast: Secondary | ICD-10-CM | POA: Diagnosis not present

## 2012-07-21 DIAGNOSIS — Z139 Encounter for screening, unspecified: Secondary | ICD-10-CM

## 2012-08-11 DIAGNOSIS — E119 Type 2 diabetes mellitus without complications: Secondary | ICD-10-CM | POA: Diagnosis not present

## 2012-08-11 DIAGNOSIS — E1149 Type 2 diabetes mellitus with other diabetic neurological complication: Secondary | ICD-10-CM | POA: Diagnosis not present

## 2012-09-12 DIAGNOSIS — M25559 Pain in unspecified hip: Secondary | ICD-10-CM | POA: Diagnosis not present

## 2012-09-12 DIAGNOSIS — E119 Type 2 diabetes mellitus without complications: Secondary | ICD-10-CM | POA: Diagnosis not present

## 2012-09-13 ENCOUNTER — Other Ambulatory Visit (HOSPITAL_COMMUNITY): Payer: Self-pay | Admitting: Internal Medicine

## 2012-09-13 ENCOUNTER — Ambulatory Visit (HOSPITAL_COMMUNITY)
Admission: RE | Admit: 2012-09-13 | Discharge: 2012-09-13 | Disposition: A | Discharge status: Home / Self Care | Payer: Medicare Other | Source: Ambulatory Visit | Attending: Internal Medicine | Admitting: Internal Medicine

## 2012-09-13 DIAGNOSIS — R52 Pain, unspecified: Secondary | ICD-10-CM

## 2012-09-13 DIAGNOSIS — M25559 Pain in unspecified hip: Secondary | ICD-10-CM | POA: Diagnosis not present

## 2012-10-19 DIAGNOSIS — E119 Type 2 diabetes mellitus without complications: Secondary | ICD-10-CM | POA: Diagnosis not present

## 2012-10-19 DIAGNOSIS — I10 Essential (primary) hypertension: Secondary | ICD-10-CM | POA: Diagnosis not present

## 2012-10-19 DIAGNOSIS — M549 Dorsalgia, unspecified: Secondary | ICD-10-CM | POA: Diagnosis not present

## 2012-10-27 DIAGNOSIS — E119 Type 2 diabetes mellitus without complications: Secondary | ICD-10-CM | POA: Diagnosis not present

## 2012-10-27 DIAGNOSIS — E1149 Type 2 diabetes mellitus with other diabetic neurological complication: Secondary | ICD-10-CM | POA: Diagnosis not present

## 2012-12-28 DIAGNOSIS — L259 Unspecified contact dermatitis, unspecified cause: Secondary | ICD-10-CM | POA: Diagnosis not present

## 2012-12-28 DIAGNOSIS — E119 Type 2 diabetes mellitus without complications: Secondary | ICD-10-CM | POA: Diagnosis not present

## 2013-01-05 DIAGNOSIS — E1149 Type 2 diabetes mellitus with other diabetic neurological complication: Secondary | ICD-10-CM | POA: Diagnosis not present

## 2013-01-05 DIAGNOSIS — L851 Acquired keratosis [keratoderma] palmaris et plantaris: Secondary | ICD-10-CM | POA: Diagnosis not present

## 2013-01-05 DIAGNOSIS — B351 Tinea unguium: Secondary | ICD-10-CM | POA: Diagnosis not present

## 2013-01-12 ENCOUNTER — Emergency Department (HOSPITAL_COMMUNITY)
Admission: EM | Admit: 2013-01-12 | Discharge: 2013-01-12 | Disposition: A | Discharge status: Home / Self Care | Payer: Medicare Other | Attending: Emergency Medicine | Admitting: Emergency Medicine

## 2013-01-12 ENCOUNTER — Emergency Department (HOSPITAL_COMMUNITY): Payer: Medicare Other

## 2013-01-12 ENCOUNTER — Encounter (HOSPITAL_COMMUNITY): Payer: Self-pay | Admitting: *Deleted

## 2013-01-12 DIAGNOSIS — I10 Essential (primary) hypertension: Secondary | ICD-10-CM | POA: Diagnosis not present

## 2013-01-12 DIAGNOSIS — E119 Type 2 diabetes mellitus without complications: Secondary | ICD-10-CM | POA: Diagnosis not present

## 2013-01-12 DIAGNOSIS — R091 Pleurisy: Secondary | ICD-10-CM | POA: Insufficient documentation

## 2013-01-12 DIAGNOSIS — R072 Precordial pain: Secondary | ICD-10-CM | POA: Diagnosis not present

## 2013-01-12 DIAGNOSIS — M7989 Other specified soft tissue disorders: Secondary | ICD-10-CM | POA: Diagnosis not present

## 2013-01-12 DIAGNOSIS — J9819 Other pulmonary collapse: Secondary | ICD-10-CM | POA: Diagnosis not present

## 2013-01-12 DIAGNOSIS — R079 Chest pain, unspecified: Secondary | ICD-10-CM | POA: Diagnosis not present

## 2013-01-12 LAB — D-DIMER, QUANTITATIVE (NOT AT ARMC): D-Dimer, Quant: 0.63 ug/mL-FEU — ABNORMAL HIGH (ref 0.00–0.48)

## 2013-01-12 LAB — CBC WITH DIFFERENTIAL/PLATELET
Basophils Relative: 0 % (ref 0–1)
Hemoglobin: 11.9 g/dL — ABNORMAL LOW (ref 12.0–15.0)
Lymphs Abs: 1.2 10*3/uL (ref 0.7–4.0)
Monocytes Relative: 10 % (ref 3–12)
Neutro Abs: 4.8 10*3/uL (ref 1.7–7.7)
Neutrophils Relative %: 70 % (ref 43–77)
RBC: 4.34 MIL/uL (ref 3.87–5.11)

## 2013-01-12 LAB — GLUCOSE, CAPILLARY: Glucose-Capillary: 178 mg/dL — ABNORMAL HIGH (ref 70–99)

## 2013-01-12 LAB — BASIC METABOLIC PANEL
GFR calc Af Amer: 37 mL/min — ABNORMAL LOW (ref >90)
GFR calc non Af Amer: 32 mL/min — ABNORMAL LOW (ref >90)
Potassium: 3.9 mEq/L (ref 3.5–5.1)
Sodium: 137 mEq/L (ref 135–145)

## 2013-01-12 MED ORDER — ASPIRIN 81 MG PO CHEW
324.0000 mg | CHEWABLE_TABLET | Freq: Once | ORAL | Status: AC
  Administered 2013-01-12: 324 mg via ORAL
  Filled 2013-01-12: qty 4

## 2013-01-12 MED ORDER — IOHEXOL 350 MG/ML SOLN
64.0000 mL | Freq: Once | INTRAVENOUS | Status: AC | PRN
  Administered 2013-01-12: 64 mL via INTRAVENOUS

## 2013-01-12 NOTE — ED Provider Notes (Signed)
CSN: [id number]     Arrival date & time 01/12/13  1205 History   This chart was scribed for Sharyon Cable, MD by Jenne Campus, ED Scribe. This patient was seen in room APA10/APA10 and the patient's care was started at 12:24 PM.    Chief Complaint  Patient presents with  . Chest Pain    Patient is a 79 y.o. female presenting with chest pain. The history is provided by the patient. No language interpreter was used.  Chest Pain Pain location:  Substernal area Pain quality comment:  Unable to specify  Pain radiates to:  Does not radiate Pain radiates to the back: no   Pain severity:  Mild Duration:  5 days Timing:  Intermittent Progression:  Unchanged Chronicity:  New Context comment:  Felt with deep breathing only Relieved by:  Nothing Worsened by:  Deep breathing Ineffective treatments:  None tried Associated symptoms: lower extremity edema (chronic, no changes )   Associated symptoms: no diaphoresis, no nausea and no syncope   Risk factors: diabetes mellitus and hypertension   Risk factors: no coronary artery disease     HPI Comments: Amy Stein is a 79 y.o. female who presents to the Emergency Department complaining of sternally located CP that is felt intermittently only with deep breathing for the past 5 days. She denies having any pain currently.  She reports taking one ASA last night with no improvement.  She denies having a h/o prior CVA or MI. She denies any recent long distance travels or surgery. She denies nausea, diaphoresis, syncope and new leg swelling as associated symptoms. She states that at baseline her left lower leg is bigger than the right and she denies any changes.   Past Medical History  Diagnosis Date  . Hypertension   . Diabetes mellitus    History reviewed. No pertinent past surgical history. History reviewed. No pertinent family history. History  Substance Use Topics  . Smoking status: Never Smoker   . Smokeless tobacco: Not on file  .  Alcohol Use: No   No OB history provided.  Review of Systems  Constitutional: Negative for diaphoresis.  Cardiovascular: Positive for chest pain. Negative for syncope.  Gastrointestinal: Negative for nausea.  All other systems reviewed and are negative.    Allergies  Sulfonamide derivatives  Home Medications  No current outpatient prescriptions on file.  Triage Vitals: BP 128/74  Pulse 87  Temp(Src) 98.9 F (37.2 C) (Oral)  Resp 18  Ht 5\' 2"  (1.575 m)  Wt 171 lb (77.565 kg)  BMI 31.27 kg/m2  SpO2 97%  Physical Exam  Nursing note and vitals reviewed.  CONSTITUTIONAL: Well developed/well nourished HEAD: Normocephalic/atraumatic EYES: EOMI/PERRL ENMT: Mucous membranes moist NECK: supple no meningeal signs SPINE:entire spine nontender CV: S1/S2 noted, no murmurs/rubs/gallops noted LUNGS: Lungs are clear to auscultation bilaterally, no apparent distress ABDOMEN: soft, nontender, no rebound or guarding GU:no cva tenderness NEURO: Pt is awake/alert, moves all extremitiesx4, pitting edema in the bilateral lower legs, left greater than right, chronic per pt EXTREMITIES: pulses normal, full ROM SKIN: warm, color normal PSYCH: no abnormalities of mood noted  ED Course  Procedures  Medications  aspirin chewable tablet 324 mg (not administered)    DIAGNOSTIC STUDIES: Oxygen Saturation is 97% on room air, normal by my interpretation.    COORDINATION OF CARE: 12:28 PM-Discussed treatment plan which includes CXR, ASA, CBC panel, BMP and troponin with pt at bedside and pt agreed to plan.   Pt with some ST  elevation on EKG, ?PR depression.  Her story is not c/w acute MI or STEMI. She is not having active pain at this time.  She describes pain as pleuritic.  She is in no distress.  Pericarditis/PE is a possibility Will follow closely 2:21 PM Pt still reports pleuritic type CP Her d-dimer is elevated Will get CT chest to r/o PE (will get reduced dose for her GFR per CT  tech) I attempted to bedside to evaluate for any pericardial effusion, but unable to obtain adequate images 3:36 PM Pt feels improved, well appearing, no distress Given pleuritic type pain, EKG does not suggest acute MI and her story is not c/w acute MI, I feel she is safe for d/c PE has been ruled out.  CT chest does not reveal any pericardial effusion Discussed strict return precautions and also need for PCP Followup next week  MDM  No diagnosis found. Nursing notes including past medical history and social history reviewed and considered in documentation xrays reviewed and considered Labs/vital reviewed and considered     Date: 01/12/2013 1217pm  Rate: 88  Rhythm: normal sinus rhythm  QRS Axis: normal  Intervals: normal  ST/T Wave abnormalities: nonspecific ST changes and ST elevations diffusely  Conduction Disutrbances:none  Narrative Interpretation: ?PR depression  Old EKG Reviewed: changes noted     Date: 01/12/2013 1340  Rate: 83  Rhythm: normal sinus rhythm  QRS Axis: normal  Intervals: normal  ST/T Wave abnormalities: nonspecific ST changes and ST elevations diffusely  Conduction Disutrbances:none  Narrative Interpretation:   Old EKG Reviewed: unchanged from EKG earlier today    I personally performed the services described in this documentation, which was scribed in my presence. The recorded information has been reviewed and is accurate.      Sharyon Cable, MD 01/12/13 1538

## 2013-01-12 NOTE — ED Notes (Signed)
Chest pain since Monday, intermittent,  No N/V, hurts to take a deep breath at times.  Alert, skin warm and dry

## 2013-02-20 DIAGNOSIS — E119 Type 2 diabetes mellitus without complications: Secondary | ICD-10-CM | POA: Diagnosis not present

## 2013-02-20 DIAGNOSIS — K219 Gastro-esophageal reflux disease without esophagitis: Secondary | ICD-10-CM | POA: Diagnosis not present

## 2013-02-20 DIAGNOSIS — I10 Essential (primary) hypertension: Secondary | ICD-10-CM | POA: Diagnosis not present

## 2013-03-16 DIAGNOSIS — E1149 Type 2 diabetes mellitus with other diabetic neurological complication: Secondary | ICD-10-CM | POA: Diagnosis not present

## 2013-03-16 DIAGNOSIS — B351 Tinea unguium: Secondary | ICD-10-CM | POA: Diagnosis not present

## 2013-03-16 DIAGNOSIS — L851 Acquired keratosis [keratoderma] palmaris et plantaris: Secondary | ICD-10-CM | POA: Diagnosis not present

## 2013-05-25 DIAGNOSIS — L851 Acquired keratosis [keratoderma] palmaris et plantaris: Secondary | ICD-10-CM | POA: Diagnosis not present

## 2013-05-25 DIAGNOSIS — B351 Tinea unguium: Secondary | ICD-10-CM | POA: Diagnosis not present

## 2013-05-25 DIAGNOSIS — E1149 Type 2 diabetes mellitus with other diabetic neurological complication: Secondary | ICD-10-CM | POA: Diagnosis not present

## 2013-05-28 ENCOUNTER — Other Ambulatory Visit (HOSPITAL_COMMUNITY)
Admission: RE | Admit: 2013-05-28 | Discharge: 2013-05-28 | Disposition: A | Discharge status: Home / Self Care | Payer: Medicare Other | Source: Ambulatory Visit | Attending: Obstetrics & Gynecology | Admitting: Obstetrics & Gynecology

## 2013-05-28 ENCOUNTER — Ambulatory Visit (INDEPENDENT_AMBULATORY_CARE_PROVIDER_SITE_OTHER): Payer: Medicare Other | Admitting: Obstetrics & Gynecology

## 2013-05-28 ENCOUNTER — Encounter: Payer: Self-pay | Admitting: Obstetrics & Gynecology

## 2013-05-28 VITALS — BP 130/100 | Ht 62.0 in | Wt 168.0 lb

## 2013-05-28 DIAGNOSIS — Z124 Encounter for screening for malignant neoplasm of cervix: Secondary | ICD-10-CM | POA: Diagnosis not present

## 2013-05-28 DIAGNOSIS — Z1212 Encounter for screening for malignant neoplasm of rectum: Secondary | ICD-10-CM | POA: Diagnosis not present

## 2013-05-28 DIAGNOSIS — Z01419 Encounter for gynecological examination (general) (routine) without abnormal findings: Secondary | ICD-10-CM

## 2013-05-28 NOTE — Progress Notes (Signed)
Patient ID: Amy Stein, female   DOB: October 06, 1933, 80 y.o.   MRN: [id number] Subjective:     Amy Stein is a 80 y.o. female here for a routine exam.  No LMP recorded. Patient is postmenopausal. No obstetric history on file. Birth Control Method:  na Menstrual Calendar(currently): no bleeding  Current complaints: none.   Current acute medical issues:  none   Recent Gynecologic History No LMP recorded. Patient is postmenopausal. Last Pap: 2014,  normal Last mammogram: 2014,  normal  Past Medical History  Diagnosis Date  . Hypertension   . Diabetes mellitus     History reviewed. No pertinent past surgical history.  OB History   Grav Para Term Preterm Abortions TAB SAB Ect Mult Living                  History   Social History  . Marital Status: Single    Spouse Name: N/A    Number of Children: N/A  . Years of Education: N/A   Social History Main Topics  . Smoking status: Never Smoker   . Smokeless tobacco: None  . Alcohol Use: No  . Drug Use: No  . Sexual Activity: None   Other Topics Concern  . None   Social History Narrative  . None    Family History  Problem Relation Age of Onset  . Throat cancer Father   . Pneumonia Mother   . Pancreatic cancer Brother      Review of Systems  Review of Systems  Constitutional: Negative for fever, chills, weight loss, malaise/fatigue and diaphoresis.  HENT: Negative for hearing loss, ear pain, nosebleeds, congestion, sore throat, neck pain, tinnitus and ear discharge.   Eyes: Negative for blurred vision, double vision, photophobia, pain, discharge and redness.  Respiratory: Negative for cough, hemoptysis, sputum production, shortness of breath, wheezing and stridor.   Cardiovascular: Negative for chest pain, palpitations, orthopnea, claudication, leg swelling and PND.  Gastrointestinal: negative for abdominal pain. Negative for heartburn, nausea, vomiting, diarrhea, constipation, blood in stool and melena.   Genitourinary: Negative for dysuria, urgency, frequency, hematuria and flank pain.  Musculoskeletal: Negative for myalgias, back pain, joint pain and falls.  Skin: Negative for itching and rash.  Neurological: Negative for dizziness, tingling, tremors, sensory change, speech change, focal weakness, seizures, loss of consciousness, weakness and headaches.  Endo/Heme/Allergies: Negative for environmental allergies and polydipsia. Does not bruise/bleed easily.  Psychiatric/Behavioral: Negative for depression, suicidal ideas, hallucinations, memory loss and substance abuse. The patient is not nervous/anxious and does not have insomnia.        Objective:    Physical Exam  Vitals reviewed. Constitutional: She is oriented to person, place, and time. She appears well-developed and well-nourished.  HENT:  Head: Normocephalic and atraumatic.        Right Ear: External ear normal.  Left Ear: External ear normal.  Nose: Nose normal.  Mouth/Throat: Oropharynx is clear and moist.  Eyes: Conjunctivae and EOM are normal. Pupils are equal, round, and reactive to light. Right eye exhibits no discharge. Left eye exhibits no discharge. No scleral icterus.  Neck: Normal range of motion. Neck supple. No tracheal deviation present. No thyromegaly present.  Cardiovascular: Normal rate, regular rhythm, normal heart sounds and intact distal pulses.  Exam reveals no gallop and no friction rub.   No murmur heard. Respiratory: Effort normal and breath sounds normal. No respiratory distress. She has no wheezes. She has no rales. She exhibits no tenderness.  GI: Soft. Bowel sounds  are normal. She exhibits no distension and no mass. There is no tenderness. There is no rebound and no guarding.  Genitourinary:  Breasts no masses skin changes or nipple changes bilaterally      Vulva is normal without lesions Vagina is pink moist without discharge Cervix normal in appearance and pap is done Uterus is normal size shape  and contour Adnexa is negative  Rectal    hemoccult negative, normal tone, no masses  Musculoskeletal: Normal range of motion. She exhibits no edema and no tenderness.  Neurological: She is alert and oriented to person, place, and time. She has normal reflexes. She displays normal reflexes. No cranial nerve deficit. She exhibits normal muscle tone. Coordination normal.  Skin: Skin is warm and dry. No rash noted. No erythema. No pallor.  Psychiatric: She has a normal mood and affect. Her behavior is normal. Judgment and thought content normal.       Assessment:    Normal Gyn exam  Plan:    Follow up in: 1 year. continue monthly provera

## 2013-05-28 NOTE — Addendum Note (Signed)
Addended by: Farley Ly on: 05/28/2013 10:00 AM   Modules accepted: Orders

## 2013-06-12 DIAGNOSIS — E119 Type 2 diabetes mellitus without complications: Secondary | ICD-10-CM | POA: Diagnosis not present

## 2013-06-12 DIAGNOSIS — N19 Unspecified kidney failure: Secondary | ICD-10-CM | POA: Diagnosis not present

## 2013-06-12 DIAGNOSIS — E7800 Pure hypercholesterolemia, unspecified: Secondary | ICD-10-CM | POA: Diagnosis not present

## 2013-06-12 DIAGNOSIS — K219 Gastro-esophageal reflux disease without esophagitis: Secondary | ICD-10-CM | POA: Diagnosis not present

## 2013-06-12 DIAGNOSIS — I10 Essential (primary) hypertension: Secondary | ICD-10-CM | POA: Diagnosis not present

## 2013-06-13 ENCOUNTER — Other Ambulatory Visit (HOSPITAL_COMMUNITY): Payer: Self-pay | Admitting: Internal Medicine

## 2013-06-13 DIAGNOSIS — Z1231 Encounter for screening mammogram for malignant neoplasm of breast: Secondary | ICD-10-CM

## 2013-06-27 ENCOUNTER — Other Ambulatory Visit: Payer: Self-pay | Admitting: Obstetrics & Gynecology

## 2013-06-29 ENCOUNTER — Other Ambulatory Visit (HOSPITAL_COMMUNITY): Payer: Self-pay | Admitting: Internal Medicine

## 2013-06-29 DIAGNOSIS — I10 Essential (primary) hypertension: Secondary | ICD-10-CM | POA: Diagnosis not present

## 2013-06-29 DIAGNOSIS — E119 Type 2 diabetes mellitus without complications: Secondary | ICD-10-CM | POA: Diagnosis not present

## 2013-06-29 DIAGNOSIS — N189 Chronic kidney disease, unspecified: Secondary | ICD-10-CM

## 2013-06-29 DIAGNOSIS — N19 Unspecified kidney failure: Secondary | ICD-10-CM | POA: Diagnosis not present

## 2013-07-03 ENCOUNTER — Ambulatory Visit (HOSPITAL_COMMUNITY)
Admission: RE | Admit: 2013-07-03 | Discharge: 2013-07-03 | Disposition: A | Discharge status: Home / Self Care | Payer: Medicare Other | Source: Ambulatory Visit | Attending: Internal Medicine | Admitting: Internal Medicine

## 2013-07-03 DIAGNOSIS — N281 Cyst of kidney, acquired: Secondary | ICD-10-CM | POA: Insufficient documentation

## 2013-07-03 DIAGNOSIS — N189 Chronic kidney disease, unspecified: Secondary | ICD-10-CM | POA: Insufficient documentation

## 2013-07-23 ENCOUNTER — Ambulatory Visit (HOSPITAL_COMMUNITY)
Admission: RE | Admit: 2013-07-23 | Discharge: 2013-07-23 | Disposition: A | Discharge status: Home / Self Care | Payer: Medicare Other | Source: Ambulatory Visit | Attending: Internal Medicine | Admitting: Internal Medicine

## 2013-07-23 DIAGNOSIS — Z1231 Encounter for screening mammogram for malignant neoplasm of breast: Secondary | ICD-10-CM | POA: Diagnosis not present

## 2013-07-25 DIAGNOSIS — I10 Essential (primary) hypertension: Secondary | ICD-10-CM | POA: Diagnosis not present

## 2013-07-25 DIAGNOSIS — E119 Type 2 diabetes mellitus without complications: Secondary | ICD-10-CM | POA: Diagnosis not present

## 2013-07-30 DIAGNOSIS — I10 Essential (primary) hypertension: Secondary | ICD-10-CM | POA: Diagnosis not present

## 2013-07-30 DIAGNOSIS — E119 Type 2 diabetes mellitus without complications: Secondary | ICD-10-CM | POA: Diagnosis not present

## 2013-08-03 DIAGNOSIS — B351 Tinea unguium: Secondary | ICD-10-CM | POA: Diagnosis not present

## 2013-08-03 DIAGNOSIS — L851 Acquired keratosis [keratoderma] palmaris et plantaris: Secondary | ICD-10-CM | POA: Diagnosis not present

## 2013-08-22 DIAGNOSIS — I129 Hypertensive chronic kidney disease with stage 1 through stage 4 chronic kidney disease, or unspecified chronic kidney disease: Secondary | ICD-10-CM | POA: Diagnosis not present

## 2013-08-22 DIAGNOSIS — M25519 Pain in unspecified shoulder: Secondary | ICD-10-CM | POA: Diagnosis not present

## 2013-08-22 DIAGNOSIS — E119 Type 2 diabetes mellitus without complications: Secondary | ICD-10-CM | POA: Diagnosis not present

## 2013-08-22 DIAGNOSIS — Z794 Long term (current) use of insulin: Secondary | ICD-10-CM | POA: Diagnosis not present

## 2013-08-22 DIAGNOSIS — M1990 Unspecified osteoarthritis, unspecified site: Secondary | ICD-10-CM | POA: Diagnosis not present

## 2013-08-22 DIAGNOSIS — N189 Chronic kidney disease, unspecified: Secondary | ICD-10-CM | POA: Diagnosis not present

## 2013-08-23 DIAGNOSIS — E119 Type 2 diabetes mellitus without complications: Secondary | ICD-10-CM | POA: Diagnosis not present

## 2013-08-23 DIAGNOSIS — Z794 Long term (current) use of insulin: Secondary | ICD-10-CM | POA: Diagnosis not present

## 2013-08-23 DIAGNOSIS — N189 Chronic kidney disease, unspecified: Secondary | ICD-10-CM | POA: Diagnosis not present

## 2013-08-23 DIAGNOSIS — I129 Hypertensive chronic kidney disease with stage 1 through stage 4 chronic kidney disease, or unspecified chronic kidney disease: Secondary | ICD-10-CM | POA: Diagnosis not present

## 2013-08-23 DIAGNOSIS — M25519 Pain in unspecified shoulder: Secondary | ICD-10-CM | POA: Diagnosis not present

## 2013-08-23 DIAGNOSIS — M1990 Unspecified osteoarthritis, unspecified site: Secondary | ICD-10-CM | POA: Diagnosis not present

## 2013-08-24 DIAGNOSIS — Z794 Long term (current) use of insulin: Secondary | ICD-10-CM | POA: Diagnosis not present

## 2013-08-24 DIAGNOSIS — M1990 Unspecified osteoarthritis, unspecified site: Secondary | ICD-10-CM | POA: Diagnosis not present

## 2013-08-24 DIAGNOSIS — N189 Chronic kidney disease, unspecified: Secondary | ICD-10-CM | POA: Diagnosis not present

## 2013-08-24 DIAGNOSIS — M25519 Pain in unspecified shoulder: Secondary | ICD-10-CM | POA: Diagnosis not present

## 2013-08-24 DIAGNOSIS — I129 Hypertensive chronic kidney disease with stage 1 through stage 4 chronic kidney disease, or unspecified chronic kidney disease: Secondary | ICD-10-CM | POA: Diagnosis not present

## 2013-08-24 DIAGNOSIS — E119 Type 2 diabetes mellitus without complications: Secondary | ICD-10-CM | POA: Diagnosis not present

## 2013-08-27 DIAGNOSIS — E119 Type 2 diabetes mellitus without complications: Secondary | ICD-10-CM | POA: Diagnosis not present

## 2013-08-27 DIAGNOSIS — N189 Chronic kidney disease, unspecified: Secondary | ICD-10-CM | POA: Diagnosis not present

## 2013-08-27 DIAGNOSIS — M25519 Pain in unspecified shoulder: Secondary | ICD-10-CM | POA: Diagnosis not present

## 2013-08-27 DIAGNOSIS — Z794 Long term (current) use of insulin: Secondary | ICD-10-CM | POA: Diagnosis not present

## 2013-08-27 DIAGNOSIS — I129 Hypertensive chronic kidney disease with stage 1 through stage 4 chronic kidney disease, or unspecified chronic kidney disease: Secondary | ICD-10-CM | POA: Diagnosis not present

## 2013-08-27 DIAGNOSIS — M1990 Unspecified osteoarthritis, unspecified site: Secondary | ICD-10-CM | POA: Diagnosis not present

## 2013-08-28 DIAGNOSIS — M25519 Pain in unspecified shoulder: Secondary | ICD-10-CM | POA: Diagnosis not present

## 2013-08-28 DIAGNOSIS — M1990 Unspecified osteoarthritis, unspecified site: Secondary | ICD-10-CM | POA: Diagnosis not present

## 2013-08-28 DIAGNOSIS — N189 Chronic kidney disease, unspecified: Secondary | ICD-10-CM | POA: Diagnosis not present

## 2013-08-28 DIAGNOSIS — E119 Type 2 diabetes mellitus without complications: Secondary | ICD-10-CM | POA: Diagnosis not present

## 2013-08-28 DIAGNOSIS — Z794 Long term (current) use of insulin: Secondary | ICD-10-CM | POA: Diagnosis not present

## 2013-08-28 DIAGNOSIS — I129 Hypertensive chronic kidney disease with stage 1 through stage 4 chronic kidney disease, or unspecified chronic kidney disease: Secondary | ICD-10-CM | POA: Diagnosis not present

## 2013-08-30 DIAGNOSIS — I10 Essential (primary) hypertension: Secondary | ICD-10-CM | POA: Diagnosis not present

## 2013-08-30 DIAGNOSIS — E119 Type 2 diabetes mellitus without complications: Secondary | ICD-10-CM | POA: Diagnosis not present

## 2013-09-03 DIAGNOSIS — M25519 Pain in unspecified shoulder: Secondary | ICD-10-CM | POA: Diagnosis not present

## 2013-09-03 DIAGNOSIS — M1990 Unspecified osteoarthritis, unspecified site: Secondary | ICD-10-CM | POA: Diagnosis not present

## 2013-09-03 DIAGNOSIS — E119 Type 2 diabetes mellitus without complications: Secondary | ICD-10-CM | POA: Diagnosis not present

## 2013-09-03 DIAGNOSIS — I129 Hypertensive chronic kidney disease with stage 1 through stage 4 chronic kidney disease, or unspecified chronic kidney disease: Secondary | ICD-10-CM | POA: Diagnosis not present

## 2013-09-03 DIAGNOSIS — N189 Chronic kidney disease, unspecified: Secondary | ICD-10-CM | POA: Diagnosis not present

## 2013-09-03 DIAGNOSIS — Z794 Long term (current) use of insulin: Secondary | ICD-10-CM | POA: Diagnosis not present

## 2013-09-11 DIAGNOSIS — Z794 Long term (current) use of insulin: Secondary | ICD-10-CM | POA: Diagnosis not present

## 2013-09-11 DIAGNOSIS — I129 Hypertensive chronic kidney disease with stage 1 through stage 4 chronic kidney disease, or unspecified chronic kidney disease: Secondary | ICD-10-CM | POA: Diagnosis not present

## 2013-09-11 DIAGNOSIS — M25519 Pain in unspecified shoulder: Secondary | ICD-10-CM | POA: Diagnosis not present

## 2013-09-11 DIAGNOSIS — M1990 Unspecified osteoarthritis, unspecified site: Secondary | ICD-10-CM | POA: Diagnosis not present

## 2013-09-11 DIAGNOSIS — E119 Type 2 diabetes mellitus without complications: Secondary | ICD-10-CM | POA: Diagnosis not present

## 2013-09-11 DIAGNOSIS — N189 Chronic kidney disease, unspecified: Secondary | ICD-10-CM | POA: Diagnosis not present

## 2013-09-18 DIAGNOSIS — E119 Type 2 diabetes mellitus without complications: Secondary | ICD-10-CM | POA: Diagnosis not present

## 2013-09-18 DIAGNOSIS — Z794 Long term (current) use of insulin: Secondary | ICD-10-CM | POA: Diagnosis not present

## 2013-09-18 DIAGNOSIS — I129 Hypertensive chronic kidney disease with stage 1 through stage 4 chronic kidney disease, or unspecified chronic kidney disease: Secondary | ICD-10-CM | POA: Diagnosis not present

## 2013-09-18 DIAGNOSIS — N189 Chronic kidney disease, unspecified: Secondary | ICD-10-CM | POA: Diagnosis not present

## 2013-09-18 DIAGNOSIS — M1990 Unspecified osteoarthritis, unspecified site: Secondary | ICD-10-CM | POA: Diagnosis not present

## 2013-09-18 DIAGNOSIS — M25519 Pain in unspecified shoulder: Secondary | ICD-10-CM | POA: Diagnosis not present

## 2013-09-25 DIAGNOSIS — N189 Chronic kidney disease, unspecified: Secondary | ICD-10-CM | POA: Diagnosis not present

## 2013-09-25 DIAGNOSIS — M25519 Pain in unspecified shoulder: Secondary | ICD-10-CM | POA: Diagnosis not present

## 2013-09-25 DIAGNOSIS — M1990 Unspecified osteoarthritis, unspecified site: Secondary | ICD-10-CM | POA: Diagnosis not present

## 2013-09-25 DIAGNOSIS — I129 Hypertensive chronic kidney disease with stage 1 through stage 4 chronic kidney disease, or unspecified chronic kidney disease: Secondary | ICD-10-CM | POA: Diagnosis not present

## 2013-09-25 DIAGNOSIS — E119 Type 2 diabetes mellitus without complications: Secondary | ICD-10-CM | POA: Diagnosis not present

## 2013-09-25 DIAGNOSIS — Z794 Long term (current) use of insulin: Secondary | ICD-10-CM | POA: Diagnosis not present

## 2013-09-28 DIAGNOSIS — E1129 Type 2 diabetes mellitus with other diabetic kidney complication: Secondary | ICD-10-CM | POA: Diagnosis not present

## 2013-09-28 DIAGNOSIS — E1142 Type 2 diabetes mellitus with diabetic polyneuropathy: Secondary | ICD-10-CM | POA: Diagnosis not present

## 2013-09-28 DIAGNOSIS — E119 Type 2 diabetes mellitus without complications: Secondary | ICD-10-CM | POA: Diagnosis not present

## 2013-09-28 DIAGNOSIS — I10 Essential (primary) hypertension: Secondary | ICD-10-CM | POA: Diagnosis not present

## 2013-10-03 DIAGNOSIS — M25519 Pain in unspecified shoulder: Secondary | ICD-10-CM | POA: Diagnosis not present

## 2013-10-03 DIAGNOSIS — N189 Chronic kidney disease, unspecified: Secondary | ICD-10-CM | POA: Diagnosis not present

## 2013-10-03 DIAGNOSIS — I129 Hypertensive chronic kidney disease with stage 1 through stage 4 chronic kidney disease, or unspecified chronic kidney disease: Secondary | ICD-10-CM | POA: Diagnosis not present

## 2013-10-03 DIAGNOSIS — Z794 Long term (current) use of insulin: Secondary | ICD-10-CM | POA: Diagnosis not present

## 2013-10-03 DIAGNOSIS — E119 Type 2 diabetes mellitus without complications: Secondary | ICD-10-CM | POA: Diagnosis not present

## 2013-10-03 DIAGNOSIS — M1990 Unspecified osteoarthritis, unspecified site: Secondary | ICD-10-CM | POA: Diagnosis not present

## 2013-10-09 DIAGNOSIS — N189 Chronic kidney disease, unspecified: Secondary | ICD-10-CM | POA: Diagnosis not present

## 2013-10-09 DIAGNOSIS — E119 Type 2 diabetes mellitus without complications: Secondary | ICD-10-CM | POA: Diagnosis not present

## 2013-10-09 DIAGNOSIS — M25519 Pain in unspecified shoulder: Secondary | ICD-10-CM | POA: Diagnosis not present

## 2013-10-09 DIAGNOSIS — Z794 Long term (current) use of insulin: Secondary | ICD-10-CM | POA: Diagnosis not present

## 2013-10-09 DIAGNOSIS — M1990 Unspecified osteoarthritis, unspecified site: Secondary | ICD-10-CM | POA: Diagnosis not present

## 2013-10-09 DIAGNOSIS — I129 Hypertensive chronic kidney disease with stage 1 through stage 4 chronic kidney disease, or unspecified chronic kidney disease: Secondary | ICD-10-CM | POA: Diagnosis not present

## 2013-10-10 DIAGNOSIS — N189 Chronic kidney disease, unspecified: Secondary | ICD-10-CM | POA: Diagnosis not present

## 2013-10-10 DIAGNOSIS — M25519 Pain in unspecified shoulder: Secondary | ICD-10-CM | POA: Diagnosis not present

## 2013-10-10 DIAGNOSIS — Z794 Long term (current) use of insulin: Secondary | ICD-10-CM | POA: Diagnosis not present

## 2013-10-10 DIAGNOSIS — I129 Hypertensive chronic kidney disease with stage 1 through stage 4 chronic kidney disease, or unspecified chronic kidney disease: Secondary | ICD-10-CM | POA: Diagnosis not present

## 2013-10-10 DIAGNOSIS — E119 Type 2 diabetes mellitus without complications: Secondary | ICD-10-CM | POA: Diagnosis not present

## 2013-10-10 DIAGNOSIS — M1990 Unspecified osteoarthritis, unspecified site: Secondary | ICD-10-CM | POA: Diagnosis not present

## 2013-10-16 DIAGNOSIS — N189 Chronic kidney disease, unspecified: Secondary | ICD-10-CM | POA: Diagnosis not present

## 2013-10-16 DIAGNOSIS — Z794 Long term (current) use of insulin: Secondary | ICD-10-CM | POA: Diagnosis not present

## 2013-10-16 DIAGNOSIS — I129 Hypertensive chronic kidney disease with stage 1 through stage 4 chronic kidney disease, or unspecified chronic kidney disease: Secondary | ICD-10-CM | POA: Diagnosis not present

## 2013-10-16 DIAGNOSIS — M1990 Unspecified osteoarthritis, unspecified site: Secondary | ICD-10-CM | POA: Diagnosis not present

## 2013-10-16 DIAGNOSIS — M25519 Pain in unspecified shoulder: Secondary | ICD-10-CM | POA: Diagnosis not present

## 2013-10-16 DIAGNOSIS — E119 Type 2 diabetes mellitus without complications: Secondary | ICD-10-CM | POA: Diagnosis not present

## 2013-10-19 DIAGNOSIS — B351 Tinea unguium: Secondary | ICD-10-CM | POA: Diagnosis not present

## 2013-10-19 DIAGNOSIS — E1149 Type 2 diabetes mellitus with other diabetic neurological complication: Secondary | ICD-10-CM | POA: Diagnosis not present

## 2013-10-19 DIAGNOSIS — L851 Acquired keratosis [keratoderma] palmaris et plantaris: Secondary | ICD-10-CM | POA: Diagnosis not present

## 2013-11-28 DIAGNOSIS — N19 Unspecified kidney failure: Secondary | ICD-10-CM | POA: Diagnosis not present

## 2013-11-28 DIAGNOSIS — E119 Type 2 diabetes mellitus without complications: Secondary | ICD-10-CM | POA: Diagnosis not present

## 2013-11-28 DIAGNOSIS — I959 Hypotension, unspecified: Secondary | ICD-10-CM | POA: Diagnosis not present

## 2013-11-28 DIAGNOSIS — I10 Essential (primary) hypertension: Secondary | ICD-10-CM | POA: Diagnosis not present

## 2013-11-28 DIAGNOSIS — K219 Gastro-esophageal reflux disease without esophagitis: Secondary | ICD-10-CM | POA: Diagnosis not present

## 2013-11-28 DIAGNOSIS — E7800 Pure hypercholesterolemia, unspecified: Secondary | ICD-10-CM | POA: Diagnosis not present

## 2013-12-04 ENCOUNTER — Emergency Department (HOSPITAL_COMMUNITY)
Admission: EM | Admit: 2013-12-04 | Discharge: 2013-12-05 | Disposition: A | Discharge status: Home / Self Care | Payer: Medicare Other | Attending: Emergency Medicine | Admitting: Emergency Medicine

## 2013-12-04 ENCOUNTER — Encounter (HOSPITAL_COMMUNITY): Payer: Self-pay | Admitting: Emergency Medicine

## 2013-12-04 DIAGNOSIS — Z79899 Other long term (current) drug therapy: Secondary | ICD-10-CM | POA: Insufficient documentation

## 2013-12-04 DIAGNOSIS — Z794 Long term (current) use of insulin: Secondary | ICD-10-CM | POA: Diagnosis not present

## 2013-12-04 DIAGNOSIS — E876 Hypokalemia: Secondary | ICD-10-CM | POA: Insufficient documentation

## 2013-12-04 DIAGNOSIS — Z7982 Long term (current) use of aspirin: Secondary | ICD-10-CM | POA: Insufficient documentation

## 2013-12-04 DIAGNOSIS — I10 Essential (primary) hypertension: Secondary | ICD-10-CM | POA: Insufficient documentation

## 2013-12-04 DIAGNOSIS — R55 Syncope and collapse: Secondary | ICD-10-CM | POA: Diagnosis not present

## 2013-12-04 DIAGNOSIS — E119 Type 2 diabetes mellitus without complications: Secondary | ICD-10-CM | POA: Insufficient documentation

## 2013-12-04 DIAGNOSIS — Z87891 Personal history of nicotine dependence: Secondary | ICD-10-CM | POA: Insufficient documentation

## 2013-12-04 DIAGNOSIS — M7989 Other specified soft tissue disorders: Secondary | ICD-10-CM | POA: Diagnosis not present

## 2013-12-04 DIAGNOSIS — R6889 Other general symptoms and signs: Secondary | ICD-10-CM | POA: Diagnosis not present

## 2013-12-04 LAB — BASIC METABOLIC PANEL
ANION GAP: 14 (ref 5–15)
BUN: 18 mg/dL (ref 6–23)
CO2: 29 meq/L (ref 19–32)
Calcium: 9.4 mg/dL (ref 8.4–10.5)
Chloride: 101 mEq/L (ref 96–112)
Creatinine, Ser: 1.24 mg/dL — ABNORMAL HIGH (ref 0.50–1.10)
GFR calc Af Amer: 47 mL/min — ABNORMAL LOW (ref >90)
GFR, EST NON AFRICAN AMERICAN: 41 mL/min — AB (ref >90)
Glucose, Bld: 81 mg/dL (ref 70–99)
Potassium: 3.2 mEq/L — ABNORMAL LOW (ref 3.7–5.3)
Sodium: 144 mEq/L (ref 137–147)

## 2013-12-04 LAB — D-DIMER, QUANTITATIVE: D-Dimer, Quant: 0.79 ug/mL-FEU — ABNORMAL HIGH (ref 0.00–0.48)

## 2013-12-04 LAB — CBC
HEMATOCRIT: 36.1 % (ref 36.0–46.0)
Hemoglobin: 11.7 g/dL — ABNORMAL LOW (ref 12.0–15.0)
MCH: 28.2 pg (ref 26.0–34.0)
MCHC: 32.4 g/dL (ref 30.0–36.0)
MCV: 87 fL (ref 78.0–100.0)
Platelets: 188 10*3/uL (ref 150–400)
RBC: 4.15 MIL/uL (ref 3.87–5.11)
RDW: 14.2 % (ref 11.5–15.5)
WBC: 6.4 10*3/uL (ref 4.0–10.5)

## 2013-12-04 NOTE — ED Notes (Signed)
Pt states "there is nothing wrong with me". Pt's family reports that her daughter came to the house and found pt "passed out on the ground". Pt states she was tired and laid down on the floor and must have fallen asleep. Pt's family reports she has not taken any of her medications this evening and hasn't eaten since lunch. EMS was called to the house, pt's reported BP was 200/100 and CBG of 65.

## 2013-12-04 NOTE — ED Notes (Signed)
Patient given orange juice and graham crackers with peanut butter for glucose of 65.

## 2013-12-04 NOTE — ED Provider Notes (Signed)
CSN: [id number]     Arrival date & time 12/04/13  2151 History  This chart was scribed for Johnna Acosta, MD by Lowella Petties, ED Scribe. The patient was seen in room APA10/APA10. Patient's care was started at 11:21 PM.   Chief Complaint  Patient presents with  . Near Syncope   The history is provided by the patient. No language interpreter was used.   HPI Comments: Amy Stein is a 80 y.o. female who presents to the Emergency Department after a near syncopal event earlier this evening. She reports that she was playing with a baby on the floor, and she fell out. She states that she ate a peanutbutter sandwich for lunch and dinner. She denies chest pain, SOB, dizziness, swelling in her legs, fevers, diarrhea, nausea, dysuria, hematuria, bowel incontinence, vomiting and cough. She reports a history of fibula fracture to her left leg 6 or 7 years ago.   The pt believes she was laying on the floor playing with a baby and fell asleep - no proddromal sx and no c/o at this time.  No hx of syncope.    Past Medical History  Diagnosis Date  . Hypertension   . Diabetes mellitus    History reviewed. No pertinent past surgical history. Family History  Problem Relation Age of Onset  . Throat cancer Father   . Pneumonia Mother   . Pancreatic cancer Brother    History  Substance Use Topics  . Smoking status: Former Research scientist (life sciences)  . Smokeless tobacco: Not on file  . Alcohol Use: No   OB History   Grav Para Term Preterm Abortions TAB SAB Ect Mult Living                 Review of Systems  Constitutional: Negative for fever and chills.  Respiratory: Negative for cough and shortness of breath.   Cardiovascular: Negative for chest pain and leg swelling.  Gastrointestinal: Negative for nausea and vomiting.  Genitourinary: Negative for dysuria and hematuria.  Neurological: Positive for syncope. Negative for dizziness.  All other systems reviewed and are negative.   Allergies  Sulfonamide  derivatives  Home Medications   Prior to Admission medications   Medication Sig Start Date End Date Taking? Authorizing Provider  aspirin EC 325 MG tablet Take 325 mg by mouth daily.   Yes Historical Provider, MD  gabapentin (NEURONTIN) 300 MG capsule Take 300 mg by mouth at bedtime.   Yes Historical Provider, MD  glipiZIDE (GLUCOTROL XL) 10 MG 24 hr tablet Take 10 mg by mouth daily with breakfast.   Yes Historical Provider, MD  insulin glargine (LANTUS) 100 UNIT/ML injection Inject 35 Units into the skin at bedtime.   Yes Historical Provider, MD  labetalol (NORMODYNE) 100 MG tablet Take 100 mg by mouth 2 (two) times daily.   Yes Historical Provider, MD  medroxyPROGESTERone (PROVERA) 10 MG tablet Take 10 mg by mouth daily.   Yes Historical Provider, MD  omeprazole (PRILOSEC) 20 MG capsule Take 20 mg by mouth daily.   Yes Historical Provider, MD  simvastatin (ZOCOR) 40 MG tablet Take 40 mg by mouth every evening.   Yes Historical Provider, MD  sitaGLIPtin (JANUVIA) 100 MG tablet Take 100 mg by mouth daily.   Yes Historical Provider, MD  traMADol-acetaminophen (ULTRACET) 37.5-325 MG per tablet Take 1 tablet by mouth 2 (two) times daily.    Yes Historical Provider, MD  valsartan (DIOVAN) 320 MG tablet Take 320 mg by mouth daily.  Yes Historical Provider, MD  potassium chloride (K-DUR) 10 MEQ tablet Take 1 tablet (10 mEq total) by mouth daily. 12/05/13   Johnna Acosta, MD   Triage Vitals: BP 163/86  Pulse 63  Temp(Src) 97.9 F (36.6 C) (Oral)  Resp 18  Ht 5\' 2"  (1.575 m)  Wt 173 lb (78.472 kg)  BMI 31.63 kg/m2  SpO2 100% Physical Exam  Nursing note and vitals reviewed. Constitutional: She appears well-developed and well-nourished. No distress.  HENT:  Head: Normocephalic and atraumatic.  Mouth/Throat: Oropharynx is clear and moist. No oropharyngeal exudate.  Eyes: Conjunctivae and EOM are normal. Pupils are equal, round, and reactive to light. Right eye exhibits no discharge. Left eye  exhibits no discharge. No scleral icterus.  Neck: Normal range of motion. Neck supple. No JVD present. No thyromegaly present.  Cardiovascular: Normal rate, regular rhythm, normal heart sounds and intact distal pulses.  Exam reveals no gallop and no friction rub.   No murmur heard. Pulmonary/Chest: Effort normal and breath sounds normal. No respiratory distress. She has no wheezes. She has no rales.  Abdominal: Soft. Bowel sounds are normal. She exhibits no distension and no mass. There is no tenderness.  Musculoskeletal: Normal range of motion. She exhibits edema. She exhibits no tenderness.  Asymmetry with mild edema LLE below the knee.  Lymphadenopathy:    She has no cervical adenopathy.  Neurological: She is alert. Coordination normal.  Neurologic exam:  Speech clear, pupils equal round reactive to light, extraocular movements intact  Normal peripheral visual fields Cranial nerves III through XII normal including no facial droop Follows commands, moves all extremities x4, normal strength to bilateral upper and lower extremities at all major muscle groups including grip Sensation normal to light touch and pinprick Coordination intact, no limb ataxia, finger-nose-finger normal Rapid alternating movements normal No pronator drift Gait normal   Skin: Skin is warm and dry. No rash noted. No erythema.  Psychiatric: She has a normal mood and affect. Her behavior is normal.    ED Course  Procedures (including critical care time) DIAGNOSTIC STUDIES: Oxygen Saturation is 100% on room air, normal by my interpretation.    COORDINATION OF CARE: 11:26 PM-Discussed treatment plan with pt at bedside and pt agreed to plan.   Labs Review Labs Reviewed  BASIC METABOLIC PANEL - Abnormal; Notable for the following:    Potassium 3.2 (*)    Creatinine, Ser 1.24 (*)    GFR calc non Af Amer 41 (*)    GFR calc Af Amer 47 (*)    All other components within normal limits  CBC - Abnormal; Notable  for the following:    Hemoglobin 11.7 (*)    All other components within normal limits  D-DIMER, QUANTITATIVE - Abnormal; Notable for the following:    D-Dimer, Quant 0.79 (*)    All other components within normal limits    Imaging Review No results found.   EKG Interpretation   Date/Time:  Tuesday December 04 2013 23:36:51 EDT Ventricular Rate:  72 PR Interval:  142 QRS Duration: 90 QT Interval:  455 QTC Calculation: 498 R Axis:   25 Text Interpretation:  Sinus rhythm Borderline T wave abnormalities  Borderline prolonged QT interval Since last tracing ST abnormalities no  longer present Abnormal ekg Confirmed by Leul Narramore  MD, Gael Delude ([id number]) on  12/04/2013 11:51:49 PM      MDM   Final diagnoses:  Hypokalemia  Left leg swelling    Well appaering, labs and ECG without acute  fidnings other than mild hypoK - no c/o - stable for d/c.  Meds given in ED:  Medications - No data to display  New Prescriptions   POTASSIUM CHLORIDE (K-DUR) 10 MEQ TABLET    Take 1 tablet (10 mEq total) by mouth daily.      I personally performed the services described in this documentation, which was scribed in my presence. The recorded information has been reviewed and is accurate.      Johnna Acosta, MD 12/05/13 [phone]

## 2013-12-05 ENCOUNTER — Other Ambulatory Visit (HOSPITAL_COMMUNITY): Payer: Self-pay | Admitting: Emergency Medicine

## 2013-12-05 ENCOUNTER — Ambulatory Visit (HOSPITAL_COMMUNITY)
Admit: 2013-12-05 | Discharge: 2013-12-05 | Disposition: A | Discharge status: Home / Self Care | Payer: Medicare Other | Source: Ambulatory Visit | Attending: Emergency Medicine | Admitting: Emergency Medicine

## 2013-12-05 DIAGNOSIS — M7989 Other specified soft tissue disorders: Secondary | ICD-10-CM | POA: Diagnosis not present

## 2013-12-05 DIAGNOSIS — E119 Type 2 diabetes mellitus without complications: Secondary | ICD-10-CM | POA: Diagnosis not present

## 2013-12-05 DIAGNOSIS — I10 Essential (primary) hypertension: Secondary | ICD-10-CM | POA: Insufficient documentation

## 2013-12-05 DIAGNOSIS — Z87891 Personal history of nicotine dependence: Secondary | ICD-10-CM | POA: Diagnosis not present

## 2013-12-05 MED ORDER — POTASSIUM CHLORIDE ER 10 MEQ PO TBCR: 10.0000 meq | EXTENDED_RELEASE_TABLET | Freq: Every day | ORAL | Status: DC

## 2013-12-05 NOTE — Discharge Instructions (Signed)
Your potassium is slightly low - use the medicines as prescribed - return to the ER in the morning to have an ultrasound of your leg to evaluate for blood clot.  Please call your doctor for a followup appointment within 24-48 hours. When you talk to your doctor please let them know that you were seen in the emergency department and have them acquire all of your records so that they can discuss the findings with you and formulate a treatment plan to fully care for your new and ongoing problems.

## 2013-12-05 NOTE — ED Provider Notes (Signed)
Ultrasound results were reviewed with the patient this morning. No evidence of DVT.  Dorie Rank, MD 12/05/13 [phone]

## 2013-12-12 DIAGNOSIS — E7800 Pure hypercholesterolemia, unspecified: Secondary | ICD-10-CM | POA: Diagnosis not present

## 2013-12-12 DIAGNOSIS — E119 Type 2 diabetes mellitus without complications: Secondary | ICD-10-CM | POA: Diagnosis not present

## 2013-12-12 DIAGNOSIS — I10 Essential (primary) hypertension: Secondary | ICD-10-CM | POA: Diagnosis not present

## 2014-01-05 DIAGNOSIS — I129 Hypertensive chronic kidney disease with stage 1 through stage 4 chronic kidney disease, or unspecified chronic kidney disease: Secondary | ICD-10-CM | POA: Diagnosis not present

## 2014-01-05 DIAGNOSIS — M7989 Other specified soft tissue disorders: Secondary | ICD-10-CM | POA: Diagnosis not present

## 2014-01-05 DIAGNOSIS — E1129 Type 2 diabetes mellitus with other diabetic kidney complication: Secondary | ICD-10-CM | POA: Diagnosis not present

## 2014-01-05 DIAGNOSIS — E785 Hyperlipidemia, unspecified: Secondary | ICD-10-CM | POA: Diagnosis not present

## 2014-01-05 DIAGNOSIS — R55 Syncope and collapse: Secondary | ICD-10-CM | POA: Diagnosis not present

## 2014-01-05 DIAGNOSIS — I700 Atherosclerosis of aorta: Secondary | ICD-10-CM | POA: Diagnosis not present

## 2014-01-05 DIAGNOSIS — N058 Unspecified nephritic syndrome with other morphologic changes: Secondary | ICD-10-CM | POA: Diagnosis present

## 2014-01-05 DIAGNOSIS — E162 Hypoglycemia, unspecified: Secondary | ICD-10-CM | POA: Diagnosis not present

## 2014-01-05 DIAGNOSIS — E119 Type 2 diabetes mellitus without complications: Secondary | ICD-10-CM | POA: Diagnosis not present

## 2014-01-05 DIAGNOSIS — R404 Transient alteration of awareness: Secondary | ICD-10-CM | POA: Diagnosis not present

## 2014-01-05 DIAGNOSIS — I779 Disorder of arteries and arterioles, unspecified: Secondary | ICD-10-CM | POA: Diagnosis not present

## 2014-01-05 DIAGNOSIS — E1169 Type 2 diabetes mellitus with other specified complication: Secondary | ICD-10-CM | POA: Diagnosis present

## 2014-01-05 DIAGNOSIS — I10 Essential (primary) hypertension: Secondary | ICD-10-CM | POA: Diagnosis not present

## 2014-01-05 DIAGNOSIS — N183 Chronic kidney disease, stage 3 unspecified: Secondary | ICD-10-CM | POA: Diagnosis present

## 2014-01-05 DIAGNOSIS — E15 Nondiabetic hypoglycemic coma: Secondary | ICD-10-CM | POA: Diagnosis not present

## 2014-01-05 DIAGNOSIS — IMO0001 Reserved for inherently not codable concepts without codable children: Secondary | ICD-10-CM | POA: Diagnosis not present

## 2014-01-05 DIAGNOSIS — E7800 Pure hypercholesterolemia, unspecified: Secondary | ICD-10-CM | POA: Diagnosis not present

## 2014-01-08 DIAGNOSIS — E119 Type 2 diabetes mellitus without complications: Secondary | ICD-10-CM | POA: Diagnosis not present

## 2014-01-16 DIAGNOSIS — E119 Type 2 diabetes mellitus without complications: Secondary | ICD-10-CM | POA: Diagnosis not present

## 2014-01-17 DIAGNOSIS — I129 Hypertensive chronic kidney disease with stage 1 through stage 4 chronic kidney disease, or unspecified chronic kidney disease: Secondary | ICD-10-CM | POA: Diagnosis not present

## 2014-01-17 DIAGNOSIS — E119 Type 2 diabetes mellitus without complications: Secondary | ICD-10-CM | POA: Diagnosis not present

## 2014-01-17 DIAGNOSIS — N939 Abnormal uterine and vaginal bleeding, unspecified: Secondary | ICD-10-CM | POA: Diagnosis not present

## 2014-01-17 DIAGNOSIS — N189 Chronic kidney disease, unspecified: Secondary | ICD-10-CM | POA: Diagnosis not present

## 2014-01-17 DIAGNOSIS — M545 Low back pain, unspecified: Secondary | ICD-10-CM | POA: Insufficient documentation

## 2014-01-17 DIAGNOSIS — E785 Hyperlipidemia, unspecified: Secondary | ICD-10-CM | POA: Diagnosis not present

## 2014-02-11 DIAGNOSIS — E119 Type 2 diabetes mellitus without complications: Secondary | ICD-10-CM | POA: Diagnosis not present

## 2014-02-11 DIAGNOSIS — M159 Polyosteoarthritis, unspecified: Secondary | ICD-10-CM | POA: Diagnosis not present

## 2014-02-11 DIAGNOSIS — N189 Chronic kidney disease, unspecified: Secondary | ICD-10-CM | POA: Diagnosis not present

## 2014-02-11 DIAGNOSIS — I10 Essential (primary) hypertension: Secondary | ICD-10-CM | POA: Diagnosis not present

## 2014-02-14 DIAGNOSIS — M159 Polyosteoarthritis, unspecified: Secondary | ICD-10-CM | POA: Diagnosis not present

## 2014-02-14 DIAGNOSIS — I10 Essential (primary) hypertension: Secondary | ICD-10-CM | POA: Diagnosis not present

## 2014-02-14 DIAGNOSIS — N189 Chronic kidney disease, unspecified: Secondary | ICD-10-CM | POA: Diagnosis not present

## 2014-02-14 DIAGNOSIS — E119 Type 2 diabetes mellitus without complications: Secondary | ICD-10-CM | POA: Diagnosis not present

## 2014-02-14 DIAGNOSIS — E118 Type 2 diabetes mellitus with unspecified complications: Secondary | ICD-10-CM | POA: Diagnosis not present

## 2014-02-22 DIAGNOSIS — E1342 Other specified diabetes mellitus with diabetic polyneuropathy: Secondary | ICD-10-CM | POA: Diagnosis not present

## 2014-02-22 DIAGNOSIS — B351 Tinea unguium: Secondary | ICD-10-CM | POA: Diagnosis not present

## 2014-02-22 DIAGNOSIS — L851 Acquired keratosis [keratoderma] palmaris et plantaris: Secondary | ICD-10-CM | POA: Diagnosis not present

## 2014-03-05 DIAGNOSIS — H25013 Cortical age-related cataract, bilateral: Secondary | ICD-10-CM | POA: Diagnosis not present

## 2014-03-05 DIAGNOSIS — E119 Type 2 diabetes mellitus without complications: Secondary | ICD-10-CM | POA: Diagnosis not present

## 2014-03-05 DIAGNOSIS — H2513 Age-related nuclear cataract, bilateral: Secondary | ICD-10-CM | POA: Diagnosis not present

## 2014-04-10 DIAGNOSIS — I10 Essential (primary) hypertension: Secondary | ICD-10-CM | POA: Diagnosis not present

## 2014-04-10 DIAGNOSIS — E119 Type 2 diabetes mellitus without complications: Secondary | ICD-10-CM | POA: Diagnosis not present

## 2014-04-10 DIAGNOSIS — M159 Polyosteoarthritis, unspecified: Secondary | ICD-10-CM | POA: Diagnosis not present

## 2014-04-19 DIAGNOSIS — I10 Essential (primary) hypertension: Secondary | ICD-10-CM | POA: Diagnosis not present

## 2014-04-19 DIAGNOSIS — E1165 Type 2 diabetes mellitus with hyperglycemia: Secondary | ICD-10-CM | POA: Diagnosis not present

## 2014-04-19 DIAGNOSIS — E119 Type 2 diabetes mellitus without complications: Secondary | ICD-10-CM | POA: Diagnosis not present

## 2014-04-19 DIAGNOSIS — M159 Polyosteoarthritis, unspecified: Secondary | ICD-10-CM | POA: Diagnosis not present

## 2014-04-19 DIAGNOSIS — Z0000 Encounter for general adult medical examination without abnormal findings: Secondary | ICD-10-CM | POA: Diagnosis not present

## 2014-05-17 DIAGNOSIS — B351 Tinea unguium: Secondary | ICD-10-CM | POA: Diagnosis not present

## 2014-05-17 DIAGNOSIS — L851 Acquired keratosis [keratoderma] palmaris et plantaris: Secondary | ICD-10-CM | POA: Diagnosis not present

## 2014-05-17 DIAGNOSIS — E1342 Other specified diabetes mellitus with diabetic polyneuropathy: Secondary | ICD-10-CM | POA: Diagnosis not present

## 2014-05-30 ENCOUNTER — Ambulatory Visit (INDEPENDENT_AMBULATORY_CARE_PROVIDER_SITE_OTHER): Payer: Medicare Other | Admitting: Obstetrics & Gynecology

## 2014-05-30 ENCOUNTER — Encounter: Payer: Self-pay | Admitting: Obstetrics & Gynecology

## 2014-05-30 VITALS — BP 120/74 | Ht 62.0 in | Wt 160.0 lb

## 2014-05-30 DIAGNOSIS — Z01419 Encounter for gynecological examination (general) (routine) without abnormal findings: Secondary | ICD-10-CM | POA: Diagnosis not present

## 2014-05-30 DIAGNOSIS — Z1211 Encounter for screening for malignant neoplasm of colon: Secondary | ICD-10-CM

## 2014-05-30 DIAGNOSIS — Z1212 Encounter for screening for malignant neoplasm of rectum: Secondary | ICD-10-CM

## 2014-05-30 DIAGNOSIS — Z0000 Encounter for general adult medical examination without abnormal findings: Secondary | ICD-10-CM

## 2014-05-30 NOTE — Progress Notes (Signed)
Patient ID: Amy Stein, female   DOB: 09-14-1933, 81 y.o.   MRN: [id number] Subjective:     Amy Stein is a 81 y.o. female here for a routine exam.  No LMP recorded. Patient is postmenopausal. No obstetric history on file. Birth Control Method:  na Menstrual Calendar(currently): amenorrhiec  Current complaints: none.   Current acute medical issues:  Diabetes hypertension   Recent Gynecologic History No LMP recorded. Patient is postmenopausal. Last Pap: 05/2013,  normal Last mammogram: 07/2013,  normal  Past Medical History  Diagnosis Date  . Hypertension   . Diabetes mellitus     History reviewed. No pertinent past surgical history.  OB History    No data available      History   Social History  . Marital Status: Single    Spouse Name: N/A  . Number of Children: N/A  . Years of Education: N/A   Social History Main Topics  . Smoking status: Former Research scientist (life sciences)  . Smokeless tobacco: Not on file  . Alcohol Use: No  . Drug Use: No  . Sexual Activity: Not on file   Other Topics Concern  . None   Social History Narrative    Family History  Problem Relation Age of Onset  . Throat cancer Father   . Pneumonia Mother   . Pancreatic cancer Brother      Current outpatient prescriptions:  .  aspirin 81 MG tablet, Take 81 mg by mouth daily., Disp: , Rfl:  .  gabapentin (NEURONTIN) 300 MG capsule, Take 100 mg by mouth at bedtime. , Disp: , Rfl:  .  glipiZIDE (GLUCOTROL XL) 10 MG 24 hr tablet, Take 10 mg by mouth daily with breakfast., Disp: , Rfl:  .  insulin glargine (LANTUS) 100 UNIT/ML injection, Inject 35 Units into the skin at bedtime., Disp: , Rfl:  .  labetalol (NORMODYNE) 100 MG tablet, Take 100 mg by mouth 2 (two) times daily., Disp: , Rfl:  .  medroxyPROGESTERone (PROVERA) 10 MG tablet, Take 10 mg by mouth daily., Disp: , Rfl:  .  omeprazole (PRILOSEC) 20 MG capsule, Take 20 mg by mouth daily., Disp: , Rfl:  .  simvastatin (ZOCOR) 40 MG tablet, Take 40 mg by mouth  every evening., Disp: , Rfl:  .  sitaGLIPtin (JANUVIA) 100 MG tablet, Take 100 mg by mouth daily., Disp: , Rfl:  .  valsartan (DIOVAN) 320 MG tablet, Take 320 mg by mouth daily., Disp: , Rfl:   Review of Systems  Review of Systems  Constitutional: Negative for fever, chills, weight loss, malaise/fatigue and diaphoresis.  HENT: Negative for hearing loss, ear pain, nosebleeds, congestion, sore throat, neck pain, tinnitus and ear discharge.   Eyes: Negative for blurred vision, double vision, photophobia, pain, discharge and redness.  Respiratory: Negative for cough, hemoptysis, sputum production, shortness of breath, wheezing and stridor.   Cardiovascular: Negative for chest pain, palpitations, orthopnea, claudication, leg swelling and PND.  Gastrointestinal: negative for abdominal pain. Negative for heartburn, nausea, vomiting, diarrhea, constipation, blood in stool and melena.  Genitourinary: Negative for dysuria, urgency, frequency, hematuria and flank pain.  Musculoskeletal: Negative for myalgias, back pain, joint pain and falls.  Skin: Negative for itching and rash.  Neurological: Negative for dizziness, tingling, tremors, sensory change, speech change, focal weakness, seizures, loss of consciousness, weakness and headaches.  Endo/Heme/Allergies: Negative for environmental allergies and polydipsia. Does not bruise/bleed easily.  Psychiatric/Behavioral: Negative for depression, suicidal ideas, hallucinations, memory loss and substance abuse. The patient is not  nervous/anxious and does not have insomnia.        Objective:  Blood pressure 120/74, height 5\' 2"  (1.575 m), weight 160 lb (72.576 kg).   Physical Exam  Vitals reviewed. Constitutional: She is oriented to person, place, and time. She appears well-developed and well-nourished.  HENT:  Head: Normocephalic and atraumatic.        Right Ear: External ear normal.  Left Ear: External ear normal.  Nose: Nose normal.  Mouth/Throat:  Oropharynx is clear and moist.  Eyes: Conjunctivae and EOM are normal. Pupils are equal, round, and reactive to light. Right eye exhibits no discharge. Left eye exhibits no discharge. No scleral icterus.  Neck: Normal range of motion. Neck supple. No tracheal deviation present. No thyromegaly present.  Cardiovascular: Normal rate, regular rhythm, normal heart sounds and intact distal pulses.  Exam reveals no gallop and no friction rub.   No murmur heard. Respiratory: Effort normal and breath sounds normal. No respiratory distress. She has no wheezes. She has no rales. She exhibits no tenderness.  GI: Soft. Bowel sounds are normal. She exhibits no distension and no mass. There is no tenderness. There is no rebound and no guarding.  Genitourinary:  Breasts no masses skin changes or nipple changes bilaterally      Vulva is normal without lesions Vagina is pink moist without discharge Cervix normal in appearance and pap is not done Uterus is normal size shape and contour Adnexa is negative with normal adnexa Rectal    hemoccult negative, normal tone, no masses  Musculoskeletal: Normal range of motion. She exhibits no edema and no tenderness.  Neurological: She is alert and oriented to person, place, and time. She has normal reflexes. She displays normal reflexes. No cranial nerve deficit. She exhibits normal muscle tone. Coordination normal.  Skin: Skin is warm and dry. No rash noted. No erythema. No pallor.  Psychiatric: She has a normal mood and affect. Her behavior is normal. Judgment and thought content normal.       Assessment:     Normal Gyn exam .    Plan:    Mammogram ordered. Follow up in: 1 year.    Continue preovera first 10 days of each month

## 2014-06-07 DIAGNOSIS — E1165 Type 2 diabetes mellitus with hyperglycemia: Secondary | ICD-10-CM | POA: Diagnosis not present

## 2014-06-07 DIAGNOSIS — I10 Essential (primary) hypertension: Secondary | ICD-10-CM | POA: Diagnosis not present

## 2014-06-12 DIAGNOSIS — E1165 Type 2 diabetes mellitus with hyperglycemia: Secondary | ICD-10-CM | POA: Diagnosis not present

## 2014-06-12 DIAGNOSIS — M25511 Pain in right shoulder: Secondary | ICD-10-CM | POA: Diagnosis not present

## 2014-06-12 DIAGNOSIS — M1990 Unspecified osteoarthritis, unspecified site: Secondary | ICD-10-CM | POA: Diagnosis not present

## 2014-06-12 DIAGNOSIS — I10 Essential (primary) hypertension: Secondary | ICD-10-CM | POA: Diagnosis not present

## 2014-06-13 ENCOUNTER — Ambulatory Visit (HOSPITAL_COMMUNITY)
Admission: RE | Admit: 2014-06-13 | Discharge: 2014-06-13 | Disposition: A | Discharge status: Home / Self Care | Payer: Medicare Other | Source: Ambulatory Visit | Attending: Internal Medicine | Admitting: Internal Medicine

## 2014-06-13 ENCOUNTER — Other Ambulatory Visit (HOSPITAL_COMMUNITY): Payer: Self-pay | Admitting: Internal Medicine

## 2014-06-13 DIAGNOSIS — M19011 Primary osteoarthritis, right shoulder: Secondary | ICD-10-CM | POA: Diagnosis not present

## 2014-06-13 DIAGNOSIS — M25511 Pain in right shoulder: Secondary | ICD-10-CM

## 2014-07-02 DIAGNOSIS — I10 Essential (primary) hypertension: Secondary | ICD-10-CM | POA: Diagnosis not present

## 2014-07-02 DIAGNOSIS — E1165 Type 2 diabetes mellitus with hyperglycemia: Secondary | ICD-10-CM | POA: Diagnosis not present

## 2014-07-04 ENCOUNTER — Other Ambulatory Visit (HOSPITAL_COMMUNITY): Payer: Self-pay | Admitting: Internal Medicine

## 2014-07-04 DIAGNOSIS — Z1231 Encounter for screening mammogram for malignant neoplasm of breast: Secondary | ICD-10-CM

## 2014-07-26 DIAGNOSIS — B351 Tinea unguium: Secondary | ICD-10-CM | POA: Diagnosis not present

## 2014-07-26 DIAGNOSIS — E1342 Other specified diabetes mellitus with diabetic polyneuropathy: Secondary | ICD-10-CM | POA: Diagnosis not present

## 2014-07-26 DIAGNOSIS — L851 Acquired keratosis [keratoderma] palmaris et plantaris: Secondary | ICD-10-CM | POA: Diagnosis not present

## 2014-07-31 ENCOUNTER — Ambulatory Visit (HOSPITAL_COMMUNITY)
Admission: RE | Admit: 2014-07-31 | Discharge: 2014-07-31 | Disposition: A | Discharge status: Home / Self Care | Payer: Medicare Other | Source: Ambulatory Visit | Attending: Internal Medicine | Admitting: Internal Medicine

## 2014-07-31 DIAGNOSIS — Z1231 Encounter for screening mammogram for malignant neoplasm of breast: Secondary | ICD-10-CM | POA: Diagnosis not present

## 2014-08-10 DIAGNOSIS — M1990 Unspecified osteoarthritis, unspecified site: Secondary | ICD-10-CM | POA: Diagnosis not present

## 2014-08-10 DIAGNOSIS — I10 Essential (primary) hypertension: Secondary | ICD-10-CM | POA: Diagnosis not present

## 2014-08-10 DIAGNOSIS — M25511 Pain in right shoulder: Secondary | ICD-10-CM | POA: Diagnosis not present

## 2014-08-10 DIAGNOSIS — E1165 Type 2 diabetes mellitus with hyperglycemia: Secondary | ICD-10-CM | POA: Diagnosis not present

## 2014-08-11 DEATH — deceased

## 2014-09-09 DIAGNOSIS — I10 Essential (primary) hypertension: Secondary | ICD-10-CM | POA: Diagnosis not present

## 2014-09-09 DIAGNOSIS — E1165 Type 2 diabetes mellitus with hyperglycemia: Secondary | ICD-10-CM | POA: Diagnosis not present

## 2014-09-23 ENCOUNTER — Other Ambulatory Visit: Payer: Self-pay | Admitting: Obstetrics & Gynecology

## 2014-09-23 DIAGNOSIS — I10 Essential (primary) hypertension: Secondary | ICD-10-CM | POA: Diagnosis not present

## 2014-09-23 DIAGNOSIS — E1142 Type 2 diabetes mellitus with diabetic polyneuropathy: Secondary | ICD-10-CM | POA: Diagnosis not present

## 2014-10-10 DIAGNOSIS — I10 Essential (primary) hypertension: Secondary | ICD-10-CM | POA: Diagnosis not present

## 2014-10-10 DIAGNOSIS — E1165 Type 2 diabetes mellitus with hyperglycemia: Secondary | ICD-10-CM | POA: Diagnosis not present

## 2014-10-10 DIAGNOSIS — N189 Chronic kidney disease, unspecified: Secondary | ICD-10-CM | POA: Diagnosis not present

## 2014-10-16 DIAGNOSIS — N189 Chronic kidney disease, unspecified: Secondary | ICD-10-CM | POA: Diagnosis not present

## 2014-10-16 DIAGNOSIS — E1165 Type 2 diabetes mellitus with hyperglycemia: Secondary | ICD-10-CM | POA: Diagnosis not present

## 2014-10-16 DIAGNOSIS — I10 Essential (primary) hypertension: Secondary | ICD-10-CM | POA: Diagnosis not present

## 2014-10-16 DIAGNOSIS — E118 Type 2 diabetes mellitus with unspecified complications: Secondary | ICD-10-CM | POA: Diagnosis not present

## 2014-10-16 DIAGNOSIS — K279 Peptic ulcer, site unspecified, unspecified as acute or chronic, without hemorrhage or perforation: Secondary | ICD-10-CM | POA: Diagnosis not present

## 2014-10-29 DIAGNOSIS — B351 Tinea unguium: Secondary | ICD-10-CM | POA: Diagnosis not present

## 2014-10-29 DIAGNOSIS — E1342 Other specified diabetes mellitus with diabetic polyneuropathy: Secondary | ICD-10-CM | POA: Diagnosis not present

## 2014-10-29 DIAGNOSIS — L851 Acquired keratosis [keratoderma] palmaris et plantaris: Secondary | ICD-10-CM | POA: Diagnosis not present

## 2014-11-16 DIAGNOSIS — I10 Essential (primary) hypertension: Secondary | ICD-10-CM | POA: Diagnosis not present

## 2014-11-16 DIAGNOSIS — E1165 Type 2 diabetes mellitus with hyperglycemia: Secondary | ICD-10-CM | POA: Diagnosis not present

## 2014-11-29 DIAGNOSIS — E1342 Other specified diabetes mellitus with diabetic polyneuropathy: Secondary | ICD-10-CM | POA: Diagnosis not present

## 2014-12-13 DIAGNOSIS — E1165 Type 2 diabetes mellitus with hyperglycemia: Secondary | ICD-10-CM | POA: Diagnosis not present

## 2014-12-13 DIAGNOSIS — L089 Local infection of the skin and subcutaneous tissue, unspecified: Secondary | ICD-10-CM | POA: Diagnosis not present

## 2014-12-13 DIAGNOSIS — I10 Essential (primary) hypertension: Secondary | ICD-10-CM | POA: Diagnosis not present

## 2015-01-10 DIAGNOSIS — L851 Acquired keratosis [keratoderma] palmaris et plantaris: Secondary | ICD-10-CM | POA: Diagnosis not present

## 2015-01-10 DIAGNOSIS — E1342 Other specified diabetes mellitus with diabetic polyneuropathy: Secondary | ICD-10-CM | POA: Diagnosis not present

## 2015-01-10 DIAGNOSIS — B351 Tinea unguium: Secondary | ICD-10-CM | POA: Diagnosis not present

## 2015-01-12 DIAGNOSIS — M1990 Unspecified osteoarthritis, unspecified site: Secondary | ICD-10-CM | POA: Diagnosis not present

## 2015-01-12 DIAGNOSIS — I10 Essential (primary) hypertension: Secondary | ICD-10-CM | POA: Diagnosis not present

## 2015-02-12 DIAGNOSIS — E1165 Type 2 diabetes mellitus with hyperglycemia: Secondary | ICD-10-CM | POA: Diagnosis not present

## 2015-02-12 DIAGNOSIS — I10 Essential (primary) hypertension: Secondary | ICD-10-CM | POA: Diagnosis not present

## 2015-03-14 DIAGNOSIS — E1165 Type 2 diabetes mellitus with hyperglycemia: Secondary | ICD-10-CM | POA: Diagnosis not present

## 2015-03-14 DIAGNOSIS — I10 Essential (primary) hypertension: Secondary | ICD-10-CM | POA: Diagnosis not present

## 2015-03-18 DIAGNOSIS — H2513 Age-related nuclear cataract, bilateral: Secondary | ICD-10-CM | POA: Diagnosis not present

## 2015-03-18 DIAGNOSIS — E119 Type 2 diabetes mellitus without complications: Secondary | ICD-10-CM | POA: Diagnosis not present

## 2015-03-18 DIAGNOSIS — Z794 Long term (current) use of insulin: Secondary | ICD-10-CM | POA: Diagnosis not present

## 2015-03-25 DIAGNOSIS — B351 Tinea unguium: Secondary | ICD-10-CM | POA: Diagnosis not present

## 2015-03-25 DIAGNOSIS — E1342 Other specified diabetes mellitus with diabetic polyneuropathy: Secondary | ICD-10-CM | POA: Diagnosis not present

## 2015-03-25 DIAGNOSIS — L851 Acquired keratosis [keratoderma] palmaris et plantaris: Secondary | ICD-10-CM | POA: Diagnosis not present

## 2015-04-16 ENCOUNTER — Other Ambulatory Visit (HOSPITAL_COMMUNITY): Payer: Self-pay | Admitting: Internal Medicine

## 2015-04-16 DIAGNOSIS — M150 Primary generalized (osteo)arthritis: Principal | ICD-10-CM

## 2015-04-16 DIAGNOSIS — N959 Unspecified menopausal and perimenopausal disorder: Secondary | ICD-10-CM

## 2015-04-16 DIAGNOSIS — K279 Peptic ulcer, site unspecified, unspecified as acute or chronic, without hemorrhage or perforation: Secondary | ICD-10-CM | POA: Diagnosis not present

## 2015-04-16 DIAGNOSIS — Z0000 Encounter for general adult medical examination without abnormal findings: Secondary | ICD-10-CM | POA: Diagnosis not present

## 2015-04-16 DIAGNOSIS — E118 Type 2 diabetes mellitus with unspecified complications: Secondary | ICD-10-CM | POA: Diagnosis not present

## 2015-04-16 DIAGNOSIS — I10 Essential (primary) hypertension: Secondary | ICD-10-CM | POA: Diagnosis not present

## 2015-04-16 DIAGNOSIS — N182 Chronic kidney disease, stage 2 (mild): Secondary | ICD-10-CM | POA: Diagnosis not present

## 2015-04-16 DIAGNOSIS — E1142 Type 2 diabetes mellitus with diabetic polyneuropathy: Secondary | ICD-10-CM | POA: Diagnosis not present

## 2015-04-16 DIAGNOSIS — M159 Polyosteoarthritis, unspecified: Secondary | ICD-10-CM

## 2015-04-16 DIAGNOSIS — N189 Chronic kidney disease, unspecified: Secondary | ICD-10-CM | POA: Diagnosis not present

## 2015-04-16 DIAGNOSIS — E785 Hyperlipidemia, unspecified: Secondary | ICD-10-CM | POA: Diagnosis not present

## 2015-04-16 DIAGNOSIS — E1165 Type 2 diabetes mellitus with hyperglycemia: Secondary | ICD-10-CM | POA: Diagnosis not present

## 2015-04-23 ENCOUNTER — Ambulatory Visit (HOSPITAL_COMMUNITY)
Admission: RE | Admit: 2015-04-23 | Discharge: 2015-04-23 | Disposition: A | Discharge status: Home / Self Care | Payer: Medicare Other | Source: Ambulatory Visit | Attending: Internal Medicine | Admitting: Internal Medicine

## 2015-04-23 DIAGNOSIS — N959 Unspecified menopausal and perimenopausal disorder: Secondary | ICD-10-CM | POA: Insufficient documentation

## 2015-04-23 DIAGNOSIS — Z13820 Encounter for screening for osteoporosis: Secondary | ICD-10-CM | POA: Diagnosis not present

## 2015-05-17 DIAGNOSIS — E1165 Type 2 diabetes mellitus with hyperglycemia: Secondary | ICD-10-CM | POA: Diagnosis not present

## 2015-05-17 DIAGNOSIS — I10 Essential (primary) hypertension: Secondary | ICD-10-CM | POA: Diagnosis not present

## 2015-06-03 ENCOUNTER — Other Ambulatory Visit (HOSPITAL_COMMUNITY)
Admission: RE | Admit: 2015-06-03 | Discharge: 2015-06-03 | Disposition: A | Discharge status: Home / Self Care | Payer: Medicare Other | Source: Ambulatory Visit | Attending: Obstetrics & Gynecology | Admitting: Obstetrics & Gynecology

## 2015-06-03 ENCOUNTER — Ambulatory Visit (INDEPENDENT_AMBULATORY_CARE_PROVIDER_SITE_OTHER): Payer: Medicare Other | Admitting: Obstetrics & Gynecology

## 2015-06-03 ENCOUNTER — Encounter: Payer: Self-pay | Admitting: Obstetrics & Gynecology

## 2015-06-03 VITALS — BP 150/80 | HR 70 | Ht 61.0 in | Wt 152.0 lb

## 2015-06-03 DIAGNOSIS — Z1211 Encounter for screening for malignant neoplasm of colon: Secondary | ICD-10-CM

## 2015-06-03 DIAGNOSIS — Z01419 Encounter for gynecological examination (general) (routine) without abnormal findings: Secondary | ICD-10-CM

## 2015-06-03 DIAGNOSIS — Z1212 Encounter for screening for malignant neoplasm of rectum: Secondary | ICD-10-CM

## 2015-06-03 DIAGNOSIS — Z124 Encounter for screening for malignant neoplasm of cervix: Secondary | ICD-10-CM

## 2015-06-03 MED ORDER — MEDROXYPROGESTERONE ACETATE 10 MG PO TABS: ORAL_TABLET | ORAL | Status: DC

## 2015-06-03 NOTE — Progress Notes (Signed)
Patient ID: Amy Stein, female   DOB: 1933-05-08, 82 y.o.   MRN: [id number] Patient ID: Amy Stein, female   DOB: [birth year], 82 y.o.   MRN: [id number] Subjective:     Amy Stein is a 82 y.o. female here for a routine exam.  No LMP recorded. Patient is postmenopausal. No obstetric history on file. Birth Control Method:  na Menstrual Calendar(currently): amenorrhiec  Current complaints: none.   Current acute medical issues:  Diabetes hypertension   Recent Gynecologic History No LMP recorded. Patient is postmenopausal. Last Pap: 05/2014,  normal Last mammogram: 07/2014,  normal  Past Medical History  Diagnosis Date  . Hypertension   . Diabetes mellitus     History reviewed. No pertinent past surgical history.  OB History    No data available      Social History   Social History  . Marital Status: Single    Spouse Name: N/A  . Number of Children: N/A  . Years of Education: N/A   Social History Main Topics  . Smoking status: Former Research scientist (life sciences)  . Smokeless tobacco: Never Used  . Alcohol Use: No  . Drug Use: No  . Sexual Activity: Yes    Birth Control/ Protection: Post-menopausal   Other Topics Concern  . None   Social History Narrative    Family History  Problem Relation Age of Onset  . Throat cancer Father   . Pneumonia Mother   . Pancreatic cancer Brother      Current outpatient prescriptions:  .  aspirin 81 MG tablet, Take 81 mg by mouth daily., Disp: , Rfl:  .  gabapentin (NEURONTIN) 300 MG capsule, Take 100 mg by mouth at bedtime. , Disp: , Rfl:  .  glipiZIDE (GLUCOTROL XL) 10 MG 24 hr tablet, Take 10 mg by mouth daily with breakfast., Disp: , Rfl:  .  insulin glargine (LANTUS) 100 UNIT/ML injection, Inject 35 Units into the skin at bedtime., Disp: , Rfl:  .  labetalol (NORMODYNE) 100 MG tablet, Take 100 mg by mouth 2 (two) times daily., Disp: , Rfl:  .  medroxyPROGESTERone (PROVERA) 10 MG tablet, TAKE ONE (1) TABLET EACH DAY, Disp: 30 tablet, Rfl: 11 .   omeprazole (PRILOSEC) 20 MG capsule, Take 20 mg by mouth daily., Disp: , Rfl:  .  simvastatin (ZOCOR) 40 MG tablet, Take 40 mg by mouth every evening., Disp: , Rfl:  .  sitaGLIPtin (JANUVIA) 100 MG tablet, Take 100 mg by mouth daily., Disp: , Rfl:  .  valsartan (DIOVAN) 320 MG tablet, Take 320 mg by mouth daily., Disp: , Rfl:   Review of Systems  Review of Systems  Constitutional: Negative for fever, chills, weight loss, malaise/fatigue and diaphoresis.  HENT: Negative for hearing loss, ear pain, nosebleeds, congestion, sore throat, neck pain, tinnitus and ear discharge.   Eyes: Negative for blurred vision, double vision, photophobia, pain, discharge and redness.  Respiratory: Negative for cough, hemoptysis, sputum production, shortness of breath, wheezing and stridor.   Cardiovascular: Negative for chest pain, palpitations, orthopnea, claudication, leg swelling and PND.  Gastrointestinal: negative for abdominal pain. Negative for heartburn, nausea, vomiting, diarrhea, constipation, blood in stool and melena.  Genitourinary: Negative for dysuria, urgency, frequency, hematuria and flank pain.  Musculoskeletal: Negative for myalgias, back pain, joint pain and falls.  Skin: Negative for itching and rash.  Neurological: Negative for dizziness, tingling, tremors, sensory change, speech change, focal weakness, seizures, loss of consciousness, weakness and headaches.  Endo/Heme/Allergies: Negative for environmental allergies and  polydipsia. Does not bruise/bleed easily.  Psychiatric/Behavioral: Negative for depression, suicidal ideas, hallucinations, memory loss and substance abuse. The patient is not nervous/anxious and does not have insomnia.        Objective:  Blood pressure 150/80, pulse 70, height 5\' 1"  (1.549 m), weight 152 lb (68.947 kg).   Physical Exam  Vitals reviewed. Constitutional: She is oriented to person, place, and time. She appears well-developed and well-nourished.  HENT:   Head: Normocephalic and atraumatic.        Right Ear: External ear normal.  Left Ear: External ear normal.  Nose: Nose normal.  Mouth/Throat: Oropharynx is clear and moist.  Eyes: Conjunctivae and EOM are normal. Pupils are equal, round, and reactive to light. Right eye exhibits no discharge. Left eye exhibits no discharge. No scleral icterus.  Neck: Normal range of motion. Neck supple. No tracheal deviation present. No thyromegaly present.  Cardiovascular: Normal rate, regular rhythm, normal heart sounds and intact distal pulses.  Exam reveals no gallop and no friction rub.   No murmur heard. Respiratory: Effort normal and breath sounds normal. No respiratory distress. She has no wheezes. She has no rales. She exhibits no tenderness.  GI: Soft. Bowel sounds are normal. She exhibits no distension and no mass. There is no tenderness. There is no rebound and no guarding.  Genitourinary:  Breasts no masses skin changes or nipple changes bilaterally      Vulva is normal without lesions Vagina is pink moist without discharge Cervix normal in appearance and pap is not done Uterus is normal size shape and contour Adnexa is negative with normal adnexa Rectal    hemoccult negative, normal tone, no masses  Musculoskeletal: Normal range of motion. She exhibits no edema and no tenderness.  Neurological: She is alert and oriented to person, place, and time. She has normal reflexes. She displays normal reflexes. No cranial nerve deficit. She exhibits normal muscle tone. Coordination normal.  Skin: Skin is warm and dry. No rash noted. No erythema. No pallor.  Psychiatric: She has a normal mood and affect. Her behavior is normal. Judgment and thought content normal.       Assessment:     Normal Gyn exam .    Plan:    Mammogram ordered. Follow up in: 1 year.    Continue preovera first 10 days of each month

## 2015-06-05 LAB — CYTOLOGY - PAP

## 2015-06-14 DIAGNOSIS — E1165 Type 2 diabetes mellitus with hyperglycemia: Secondary | ICD-10-CM | POA: Diagnosis not present

## 2015-06-14 DIAGNOSIS — I10 Essential (primary) hypertension: Secondary | ICD-10-CM | POA: Diagnosis not present

## 2015-07-01 DIAGNOSIS — L851 Acquired keratosis [keratoderma] palmaris et plantaris: Secondary | ICD-10-CM | POA: Diagnosis not present

## 2015-07-01 DIAGNOSIS — B351 Tinea unguium: Secondary | ICD-10-CM | POA: Diagnosis not present

## 2015-07-01 DIAGNOSIS — E1342 Other specified diabetes mellitus with diabetic polyneuropathy: Secondary | ICD-10-CM | POA: Diagnosis not present

## 2015-07-15 DIAGNOSIS — E1165 Type 2 diabetes mellitus with hyperglycemia: Secondary | ICD-10-CM | POA: Diagnosis not present

## 2015-07-15 DIAGNOSIS — I10 Essential (primary) hypertension: Secondary | ICD-10-CM | POA: Diagnosis not present

## 2015-08-14 DIAGNOSIS — E1165 Type 2 diabetes mellitus with hyperglycemia: Secondary | ICD-10-CM | POA: Diagnosis not present

## 2015-08-14 DIAGNOSIS — I10 Essential (primary) hypertension: Secondary | ICD-10-CM | POA: Diagnosis not present

## 2015-09-09 ENCOUNTER — Other Ambulatory Visit (HOSPITAL_COMMUNITY): Payer: Self-pay | Admitting: Internal Medicine

## 2015-09-09 DIAGNOSIS — Z1231 Encounter for screening mammogram for malignant neoplasm of breast: Secondary | ICD-10-CM

## 2015-09-12 ENCOUNTER — Ambulatory Visit (HOSPITAL_COMMUNITY): Payer: Medicare Other

## 2015-09-14 DIAGNOSIS — E1165 Type 2 diabetes mellitus with hyperglycemia: Secondary | ICD-10-CM | POA: Diagnosis not present

## 2015-09-14 DIAGNOSIS — I10 Essential (primary) hypertension: Secondary | ICD-10-CM | POA: Diagnosis not present

## 2015-09-16 DIAGNOSIS — E1342 Other specified diabetes mellitus with diabetic polyneuropathy: Secondary | ICD-10-CM | POA: Diagnosis not present

## 2015-09-16 DIAGNOSIS — B351 Tinea unguium: Secondary | ICD-10-CM | POA: Diagnosis not present

## 2015-09-16 DIAGNOSIS — L851 Acquired keratosis [keratoderma] palmaris et plantaris: Secondary | ICD-10-CM | POA: Diagnosis not present

## 2015-09-17 ENCOUNTER — Ambulatory Visit (HOSPITAL_COMMUNITY)
Admission: RE | Admit: 2015-09-17 | Discharge: 2015-09-17 | Disposition: A | Discharge status: Home / Self Care | Payer: Medicare Other | Source: Ambulatory Visit | Attending: Internal Medicine | Admitting: Internal Medicine

## 2015-09-17 DIAGNOSIS — Z1231 Encounter for screening mammogram for malignant neoplasm of breast: Secondary | ICD-10-CM | POA: Diagnosis not present

## 2015-10-15 DIAGNOSIS — K279 Peptic ulcer, site unspecified, unspecified as acute or chronic, without hemorrhage or perforation: Secondary | ICD-10-CM | POA: Diagnosis not present

## 2015-10-15 DIAGNOSIS — E1165 Type 2 diabetes mellitus with hyperglycemia: Secondary | ICD-10-CM | POA: Diagnosis not present

## 2015-10-15 DIAGNOSIS — N182 Chronic kidney disease, stage 2 (mild): Secondary | ICD-10-CM | POA: Diagnosis not present

## 2015-10-15 DIAGNOSIS — I10 Essential (primary) hypertension: Secondary | ICD-10-CM | POA: Diagnosis not present

## 2015-10-27 DIAGNOSIS — E1165 Type 2 diabetes mellitus with hyperglycemia: Secondary | ICD-10-CM | POA: Diagnosis not present

## 2015-10-27 DIAGNOSIS — E1122 Type 2 diabetes mellitus with diabetic chronic kidney disease: Secondary | ICD-10-CM | POA: Diagnosis not present

## 2015-11-04 DIAGNOSIS — E1165 Type 2 diabetes mellitus with hyperglycemia: Secondary | ICD-10-CM | POA: Diagnosis not present

## 2015-11-04 DIAGNOSIS — E1122 Type 2 diabetes mellitus with diabetic chronic kidney disease: Secondary | ICD-10-CM | POA: Diagnosis not present

## 2015-11-06 DIAGNOSIS — E1165 Type 2 diabetes mellitus with hyperglycemia: Secondary | ICD-10-CM | POA: Diagnosis not present

## 2015-11-12 DIAGNOSIS — E1122 Type 2 diabetes mellitus with diabetic chronic kidney disease: Secondary | ICD-10-CM | POA: Diagnosis not present

## 2015-11-12 DIAGNOSIS — E1165 Type 2 diabetes mellitus with hyperglycemia: Secondary | ICD-10-CM | POA: Diagnosis not present

## 2015-11-15 DIAGNOSIS — E1165 Type 2 diabetes mellitus with hyperglycemia: Secondary | ICD-10-CM | POA: Diagnosis not present

## 2015-11-15 DIAGNOSIS — I10 Essential (primary) hypertension: Secondary | ICD-10-CM | POA: Diagnosis not present

## 2015-11-18 DIAGNOSIS — E1165 Type 2 diabetes mellitus with hyperglycemia: Secondary | ICD-10-CM | POA: Diagnosis not present

## 2015-11-18 DIAGNOSIS — E1122 Type 2 diabetes mellitus with diabetic chronic kidney disease: Secondary | ICD-10-CM | POA: Diagnosis not present

## 2015-11-25 DIAGNOSIS — L851 Acquired keratosis [keratoderma] palmaris et plantaris: Secondary | ICD-10-CM | POA: Diagnosis not present

## 2015-11-25 DIAGNOSIS — E1342 Other specified diabetes mellitus with diabetic polyneuropathy: Secondary | ICD-10-CM | POA: Diagnosis not present

## 2015-11-25 DIAGNOSIS — B351 Tinea unguium: Secondary | ICD-10-CM | POA: Diagnosis not present

## 2015-12-16 DIAGNOSIS — I10 Essential (primary) hypertension: Secondary | ICD-10-CM | POA: Diagnosis not present

## 2015-12-16 DIAGNOSIS — E1165 Type 2 diabetes mellitus with hyperglycemia: Secondary | ICD-10-CM | POA: Diagnosis not present

## 2016-01-15 DIAGNOSIS — I10 Essential (primary) hypertension: Secondary | ICD-10-CM | POA: Diagnosis not present

## 2016-01-15 DIAGNOSIS — E1165 Type 2 diabetes mellitus with hyperglycemia: Secondary | ICD-10-CM | POA: Diagnosis not present

## 2016-02-10 DIAGNOSIS — B351 Tinea unguium: Secondary | ICD-10-CM | POA: Diagnosis not present

## 2016-02-10 DIAGNOSIS — L851 Acquired keratosis [keratoderma] palmaris et plantaris: Secondary | ICD-10-CM | POA: Diagnosis not present

## 2016-02-10 DIAGNOSIS — E1342 Other specified diabetes mellitus with diabetic polyneuropathy: Secondary | ICD-10-CM | POA: Diagnosis not present

## 2016-02-16 DIAGNOSIS — I10 Essential (primary) hypertension: Secondary | ICD-10-CM | POA: Diagnosis not present

## 2016-02-16 DIAGNOSIS — E1165 Type 2 diabetes mellitus with hyperglycemia: Secondary | ICD-10-CM | POA: Diagnosis not present

## 2016-03-17 DIAGNOSIS — I10 Essential (primary) hypertension: Secondary | ICD-10-CM | POA: Diagnosis not present

## 2016-03-17 DIAGNOSIS — E1165 Type 2 diabetes mellitus with hyperglycemia: Secondary | ICD-10-CM | POA: Diagnosis not present

## 2016-04-13 DIAGNOSIS — K279 Peptic ulcer, site unspecified, unspecified as acute or chronic, without hemorrhage or perforation: Secondary | ICD-10-CM | POA: Diagnosis not present

## 2016-04-13 DIAGNOSIS — E1165 Type 2 diabetes mellitus with hyperglycemia: Secondary | ICD-10-CM | POA: Diagnosis not present

## 2016-04-13 DIAGNOSIS — N189 Chronic kidney disease, unspecified: Secondary | ICD-10-CM | POA: Diagnosis not present

## 2016-04-13 DIAGNOSIS — E1142 Type 2 diabetes mellitus with diabetic polyneuropathy: Secondary | ICD-10-CM | POA: Diagnosis not present

## 2016-04-13 DIAGNOSIS — E785 Hyperlipidemia, unspecified: Secondary | ICD-10-CM | POA: Diagnosis not present

## 2016-04-13 DIAGNOSIS — I10 Essential (primary) hypertension: Secondary | ICD-10-CM | POA: Diagnosis not present

## 2016-04-13 DIAGNOSIS — Z0000 Encounter for general adult medical examination without abnormal findings: Secondary | ICD-10-CM | POA: Diagnosis not present

## 2016-04-13 DIAGNOSIS — E118 Type 2 diabetes mellitus with unspecified complications: Secondary | ICD-10-CM | POA: Diagnosis not present

## 2016-04-20 DIAGNOSIS — L851 Acquired keratosis [keratoderma] palmaris et plantaris: Secondary | ICD-10-CM | POA: Diagnosis not present

## 2016-04-20 DIAGNOSIS — B351 Tinea unguium: Secondary | ICD-10-CM | POA: Diagnosis not present

## 2016-04-20 DIAGNOSIS — E1342 Other specified diabetes mellitus with diabetic polyneuropathy: Secondary | ICD-10-CM | POA: Diagnosis not present

## 2016-04-23 DIAGNOSIS — B029 Zoster without complications: Secondary | ICD-10-CM | POA: Diagnosis not present

## 2016-04-23 DIAGNOSIS — E1142 Type 2 diabetes mellitus with diabetic polyneuropathy: Secondary | ICD-10-CM | POA: Diagnosis not present

## 2016-05-13 ENCOUNTER — Emergency Department (HOSPITAL_COMMUNITY): Payer: Medicare Other

## 2016-05-13 ENCOUNTER — Encounter (HOSPITAL_COMMUNITY): Payer: Self-pay | Admitting: *Deleted

## 2016-05-13 ENCOUNTER — Emergency Department (HOSPITAL_COMMUNITY)
Admission: EM | Admit: 2016-05-13 | Discharge: 2016-05-13 | Disposition: A | Discharge status: Home / Self Care | Payer: Medicare Other | Attending: Emergency Medicine | Admitting: Emergency Medicine

## 2016-05-13 DIAGNOSIS — Z7982 Long term (current) use of aspirin: Secondary | ICD-10-CM | POA: Diagnosis not present

## 2016-05-13 DIAGNOSIS — I10 Essential (primary) hypertension: Secondary | ICD-10-CM | POA: Insufficient documentation

## 2016-05-13 DIAGNOSIS — Z79899 Other long term (current) drug therapy: Secondary | ICD-10-CM | POA: Insufficient documentation

## 2016-05-13 DIAGNOSIS — R079 Chest pain, unspecified: Secondary | ICD-10-CM | POA: Diagnosis not present

## 2016-05-13 DIAGNOSIS — Z87891 Personal history of nicotine dependence: Secondary | ICD-10-CM | POA: Insufficient documentation

## 2016-05-13 DIAGNOSIS — R05 Cough: Secondary | ICD-10-CM | POA: Insufficient documentation

## 2016-05-13 DIAGNOSIS — R059 Cough, unspecified: Secondary | ICD-10-CM

## 2016-05-13 DIAGNOSIS — E119 Type 2 diabetes mellitus without complications: Secondary | ICD-10-CM | POA: Insufficient documentation

## 2016-05-13 DIAGNOSIS — R509 Fever, unspecified: Secondary | ICD-10-CM | POA: Insufficient documentation

## 2016-05-13 DIAGNOSIS — Z794 Long term (current) use of insulin: Secondary | ICD-10-CM | POA: Diagnosis not present

## 2016-05-13 HISTORY — DX: Zoster without complications: B02.9

## 2016-05-13 LAB — I-STAT CHEM 8, ED
BUN: 16 mg/dL (ref 6–20)
Calcium, Ion: 1.15 mmol/L (ref 1.15–1.40)
Chloride: 103 mmol/L (ref 101–111)
Creatinine, Ser: 1 mg/dL (ref 0.44–1.00)
Glucose, Bld: 142 mg/dL — ABNORMAL HIGH (ref 65–99)
HCT: 32 % — ABNORMAL LOW (ref 36.0–46.0)
HEMOGLOBIN: 10.9 g/dL — AB (ref 12.0–15.0)
Potassium: 3.5 mmol/L (ref 3.5–5.1)
SODIUM: 140 mmol/L (ref 135–145)
TCO2: 25 mmol/L (ref 0–100)

## 2016-05-13 MED ORDER — AZITHROMYCIN 250 MG PO TABS: ORAL_TABLET | ORAL | 0 refills | Status: DC

## 2016-05-13 MED ORDER — ACETAMINOPHEN 325 MG PO TABS
650.0000 mg | ORAL_TABLET | Freq: Once | ORAL | Status: AC
  Administered 2016-05-13: 650 mg via ORAL

## 2016-05-13 MED ORDER — OSELTAMIVIR PHOSPHATE 75 MG PO CAPS
75.0000 mg | ORAL_CAPSULE | Freq: Once | ORAL | Status: AC
  Administered 2016-05-13: 75 mg via ORAL
  Filled 2016-05-13: qty 1

## 2016-05-13 MED ORDER — AZITHROMYCIN 250 MG PO TABS
500.0000 mg | ORAL_TABLET | Freq: Once | ORAL | Status: AC
  Administered 2016-05-13: 500 mg via ORAL
  Filled 2016-05-13: qty 2

## 2016-05-13 MED ORDER — OSELTAMIVIR PHOSPHATE 75 MG PO CAPS: 75.0000 mg | ORAL_CAPSULE | Freq: Two times a day (BID) | ORAL | 0 refills | Status: DC

## 2016-05-13 MED ORDER — ACETAMINOPHEN 325 MG PO TABS
ORAL_TABLET | ORAL | Status: DC
Start: 2016-05-13 — End: 2016-05-14
  Filled 2016-05-13: qty 2

## 2016-05-13 NOTE — ED Triage Notes (Signed)
Pt comes in for cough and congestion starting Tuesday. Pt denies any n/v/d. Pt has fever in triage. NAD noted.

## 2016-05-13 NOTE — Discharge Instructions (Signed)
Drink plenty of fluids. Take Tylenol for fever and follow-up with your doctor if not improving

## 2016-05-13 NOTE — ED Provider Notes (Signed)
[facility] DEPT Provider Note   CSN: [id number] Arrival date & time: 05/13/16  1715     History   Chief Complaint Chief Complaint  Patient presents with  . Cough    HPI Amy Stein is a 83 y.o. female.  Patient complains of cough for 2 days now mild fever   The history is provided by the patient.  Cough  This is a new problem. The current episode started 2 days ago. The problem occurs constantly. The problem has not changed since onset.The cough is non-productive. Pertinent negatives include no chest pain and no headaches.    Past Medical History:  Diagnosis Date  . Diabetes mellitus   . Hypertension   . Shingles    on both legs 1/18    Patient Active Problem List   Diagnosis Date Noted  . TEAR MEDIAL MENISCUS 09/04/2008  . KNEE PAIN, RIGHT 07/03/2008  . FRACTURE, ANKLE, LEFT 05/14/2008    History reviewed. No pertinent surgical history.  OB History    No data available       Home Medications    Prior to Admission medications   Medication Sig Start Date End Date Taking? Authorizing Provider  aspirin 81 MG tablet Take 81 mg by mouth daily.   Yes Historical Provider, MD  gabapentin (NEURONTIN) 300 MG capsule Take 100 mg by mouth 2 (two) times daily.    Yes Historical Provider, MD  glipiZIDE (GLUCOTROL XL) 10 MG 24 hr tablet Take 10 mg by mouth daily with breakfast.   Yes Historical Provider, MD  insulin glargine (LANTUS) 100 UNIT/ML injection Inject 35 Units into the skin at bedtime.   Yes Historical Provider, MD  labetalol (NORMODYNE) 100 MG tablet Take 100 mg by mouth 2 (two) times daily.   Yes Historical Provider, MD  medroxyPROGESTERone (PROVERA) 10 MG tablet TAKE ONE (1) TABLET EACH DAY 06/03/15  Yes Florian Buff, MD  omeprazole (PRILOSEC) 20 MG capsule Take 20 mg by mouth daily.   Yes Historical Provider, MD  simvastatin (ZOCOR) 40 MG tablet Take 40 mg by mouth every evening.   Yes Historical Provider, MD  sitaGLIPtin (JANUVIA) 100 MG tablet Take  100 mg by mouth daily.   Yes Historical Provider, MD  valsartan (DIOVAN) 320 MG tablet Take 320 mg by mouth daily.   Yes Historical Provider, MD  azithromycin (ZITHROMAX Z-PAK) 250 MG tablet Take one pill a day 05/13/16   Milton Ferguson, MD  oseltamivir (TAMIFLU) 75 MG capsule Take 1 capsule (75 mg total) by mouth every 12 (twelve) hours. 05/13/16   Milton Ferguson, MD    Family History Family History  Problem Relation Age of Onset  . Throat cancer Father   . Pneumonia Mother   . Pancreatic cancer Brother     Social History Social History  Substance Use Topics  . Smoking status: Former Research scientist (life sciences)  . Smokeless tobacco: Never Used  . Alcohol use No     Allergies   Sulfonamide derivatives   Review of Systems Review of Systems  Constitutional: Positive for fever. Negative for appetite change and fatigue.  HENT: Negative for congestion, ear discharge and sinus pressure.   Eyes: Negative for discharge.  Respiratory: Positive for cough.   Cardiovascular: Negative for chest pain.  Gastrointestinal: Negative for abdominal pain and diarrhea.  Genitourinary: Negative for frequency and hematuria.  Musculoskeletal: Negative for back pain.  Skin: Negative for rash.  Neurological: Negative for seizures and headaches.  Psychiatric/Behavioral: Negative for hallucinations.  Physical Exam Updated Vital Signs BP 140/66 (BP Location: Left Arm)   Pulse 73   Temp 101.5 F (38.6 C) (Oral)   Resp 18   Ht 5\' 2"  (1.575 m)   Wt 158 lb (71.7 kg)   SpO2 98%   BMI 28.90 kg/m   Physical Exam  Constitutional: She is oriented to person, place, and time. She appears well-developed.  HENT:  Head: Normocephalic.  Eyes: Conjunctivae and EOM are normal. No scleral icterus.  Neck: Neck supple. No thyromegaly present.  Cardiovascular: Normal rate and regular rhythm.  Exam reveals no gallop and no friction rub.   No murmur heard. Pulmonary/Chest: No stridor. She has no wheezes. She has no rales. She  exhibits no tenderness.  Abdominal: She exhibits no distension. There is no tenderness. There is no rebound.  Musculoskeletal: Normal range of motion. She exhibits no edema.  Lymphadenopathy:    She has no cervical adenopathy.  Neurological: She is oriented to person, place, and time. She exhibits normal muscle tone. Coordination normal.  Skin: No rash noted. No erythema.  Psychiatric: She has a normal mood and affect. Her behavior is normal.     ED Treatments / Results  Labs (all labs ordered are listed, but only abnormal results are displayed) Labs Reviewed  I-STAT CHEM 8, ED - Abnormal; Notable for the following:       Result Value   Glucose, Bld 142 (*)    Hemoglobin 10.9 (*)    HCT 32.0 (*)    All other components within normal limits    EKG  EKG Interpretation None       Radiology Dg Chest 2 View  Result Date: 05/13/2016 CLINICAL DATA:  Chest pain and productive cough. Shortness of breath. EXAM: CHEST  2 VIEW COMPARISON:  CT of the chest 01/12/2013 FINDINGS: Cardiomediastinal silhouette is normal. Mediastinal contours appear intact. Calcific atherosclerotic disease and tortuosity of the aorta noted. There is no evidence of focal airspace consolidation, pleural effusion or pneumothorax. Mild central bronchiectasis. Osseous structures are without acute abnormality. Soft tissues are grossly normal. IMPRESSION: Mild central bronchiectasis. No evidence of focal airspace consolidation. Calcific atherosclerotic disease of the aorta. Electronically Signed   By: Fidela Salisbury M.D.   On: 05/13/2016 17:43    Procedures Procedures (including critical care time)  Medications Ordered in ED Medications  acetaminophen (TYLENOL) 325 MG tablet (not administered)  oseltamivir (TAMIFLU) capsule 75 mg (not administered)  azithromycin (ZITHROMAX) tablet 500 mg (not administered)  acetaminophen (TYLENOL) tablet 650 mg (650 mg Oral Given 05/13/16 1730)     Initial Impression /  Assessment and Plan / ED Course  I have reviewed the triage vital signs and the nursing notes.  Pertinent labs & imaging results that were available during my care of the patient were reviewed by me and considered in my medical decision making (see chart for details).     Patient with cough and fever. Possible influenza. Patient will be placed on Tamiflu and Z-Pak and follow-up PCP  Final Clinical Impressions(s) / ED Diagnoses   Final diagnoses:  Cough    New Prescriptions New Prescriptions   AZITHROMYCIN (ZITHROMAX Z-PAK) 250 MG TABLET    Take one pill a day   OSELTAMIVIR (TAMIFLU) 75 MG CAPSULE    Take 1 capsule (75 mg total) by mouth every 12 (twelve) hours.     Milton Ferguson, MD 05/13/16 2142

## 2016-05-24 DIAGNOSIS — I10 Essential (primary) hypertension: Secondary | ICD-10-CM | POA: Diagnosis not present

## 2016-05-24 DIAGNOSIS — E1142 Type 2 diabetes mellitus with diabetic polyneuropathy: Secondary | ICD-10-CM | POA: Diagnosis not present

## 2016-06-08 ENCOUNTER — Encounter: Payer: Self-pay | Admitting: Obstetrics & Gynecology

## 2016-06-08 ENCOUNTER — Ambulatory Visit (INDEPENDENT_AMBULATORY_CARE_PROVIDER_SITE_OTHER): Payer: Medicare Other | Admitting: Adult Health

## 2016-06-08 VITALS — BP 138/84 | HR 73 | Ht 62.0 in | Wt 157.0 lb

## 2016-06-08 DIAGNOSIS — Z1212 Encounter for screening for malignant neoplasm of rectum: Secondary | ICD-10-CM

## 2016-06-08 DIAGNOSIS — Z1211 Encounter for screening for malignant neoplasm of colon: Secondary | ICD-10-CM

## 2016-06-08 DIAGNOSIS — Z01419 Encounter for gynecological examination (general) (routine) without abnormal findings: Secondary | ICD-10-CM

## 2016-06-08 LAB — HEMOCCULT GUIAC POC 1CARD (OFFICE): Fecal Occult Blood, POC: NEGATIVE

## 2016-06-08 MED ORDER — MEDROXYPROGESTERONE ACETATE 10 MG PO TABS: ORAL_TABLET | ORAL | 11 refills | Status: DC

## 2016-06-08 NOTE — Patient Instructions (Addendum)
Physical in 1 year Labs with PCP  Mammogram yearly Colonoscopy per GI

## 2016-06-08 NOTE — Progress Notes (Signed)
Patient ID: Amy Stein, female   DOB: Feb 05, 1934, 83 y.o.   MRN: [id number] History of Present Illness: Elysa is a 83 year old black female,single,G4P4, PM in for a well woman gyn exam, she had a normal pap 06/03/15 with Dr Elonda Husky. PCP is Dr Legrand Rams.    Current Medications, Allergies, Past Medical History, Past Surgical History, Family History and Social History were reviewed in [employer] record.     Review of Systems:  Patient denies any headaches, hearing loss, fatigue, blurred vision, shortness of breath, chest pain, abdominal pain, problems with bowel movements, urination, or intercourse. No joint pain or mood swings.Had PMB and DR Elonda Husky put her on provera every month, no bleeding in long time.Has had cough and had chest Xray 05/13/16.   Physical Exam:BP 138/84 (BP Location: Right Arm, Patient Position: Sitting, Cuff Size: Normal)   Pulse 73   Ht 5\' 2"  (1.575 m)   Wt 157 lb (71.2 kg)   BMI 28.72 kg/m  General:  Well developed, well nourished, no acute distress Skin:  Warm and dry Neck:  Midline trachea, normal thyroid, good ROM, no lymphadenopathy,no carotid bruits heard Lungs; Clear to auscultation bilaterally Breast:  No dominant palpable mass, retraction, or nipple discharge Cardiovascular: Regular rate and rhythm Abdomen:  Soft, non tender, no hepatosplenomegaly Pelvic:  External genitalia is normal in appearance, no lesions.  The vagina is normal in appearance. Urethra has no lesions or masses. The cervix is smooth.  Uterus is felt to be normal size, shape, and contour.  No adnexal masses or tenderness noted.Bladder is non tender, no masses felt. Rectal: Good sphincter tone, no polyps, or hemorrhoids felt.  Hemoccult negative. Extremities/musculoskeletal:  No swelling or varicosities noted, no clubbing or cyanosis Psych:  No mood changes, alert and cooperative,seems happy PHQ 2 score 0.  Impression: 1. Well woman exam with routine gynecological exam   2.  Screening for colorectal cancer       Plan: Physical in 1 year Labs with PCP  Mammogram yearly Colonoscopy per GI Rx provera 10 mg #30 take 1 daily first 10 days of month with 12 refills as per Dr Elonda Husky

## 2016-06-29 DIAGNOSIS — L851 Acquired keratosis [keratoderma] palmaris et plantaris: Secondary | ICD-10-CM | POA: Diagnosis not present

## 2016-06-29 DIAGNOSIS — E1342 Other specified diabetes mellitus with diabetic polyneuropathy: Secondary | ICD-10-CM | POA: Diagnosis not present

## 2016-06-29 DIAGNOSIS — B351 Tinea unguium: Secondary | ICD-10-CM | POA: Diagnosis not present

## 2016-08-05 ENCOUNTER — Other Ambulatory Visit (HOSPITAL_COMMUNITY): Payer: Self-pay | Admitting: Internal Medicine

## 2016-08-05 DIAGNOSIS — Z1231 Encounter for screening mammogram for malignant neoplasm of breast: Secondary | ICD-10-CM

## 2016-08-29 ENCOUNTER — Emergency Department (HOSPITAL_COMMUNITY)
Admission: EM | Admit: 2016-08-29 | Discharge: 2016-08-29 | Disposition: A | Discharge status: Home / Self Care | Payer: Medicare Other | Attending: Emergency Medicine | Admitting: Emergency Medicine

## 2016-08-29 ENCOUNTER — Emergency Department (HOSPITAL_COMMUNITY): Payer: Medicare Other

## 2016-08-29 DIAGNOSIS — E860 Dehydration: Secondary | ICD-10-CM | POA: Insufficient documentation

## 2016-08-29 DIAGNOSIS — Y999 Unspecified external cause status: Secondary | ICD-10-CM | POA: Insufficient documentation

## 2016-08-29 DIAGNOSIS — W1800XA Striking against unspecified object with subsequent fall, initial encounter: Secondary | ICD-10-CM | POA: Diagnosis not present

## 2016-08-29 DIAGNOSIS — Z87891 Personal history of nicotine dependence: Secondary | ICD-10-CM | POA: Diagnosis not present

## 2016-08-29 DIAGNOSIS — S0003XA Contusion of scalp, initial encounter: Secondary | ICD-10-CM | POA: Insufficient documentation

## 2016-08-29 DIAGNOSIS — I10 Essential (primary) hypertension: Secondary | ICD-10-CM | POA: Diagnosis not present

## 2016-08-29 DIAGNOSIS — S0990XA Unspecified injury of head, initial encounter: Secondary | ICD-10-CM | POA: Diagnosis present

## 2016-08-29 DIAGNOSIS — W19XXXA Unspecified fall, initial encounter: Secondary | ICD-10-CM

## 2016-08-29 DIAGNOSIS — Y92002 Bathroom of unspecified non-institutional (private) residence single-family (private) house as the place of occurrence of the external cause: Secondary | ICD-10-CM | POA: Insufficient documentation

## 2016-08-29 DIAGNOSIS — N289 Disorder of kidney and ureter, unspecified: Secondary | ICD-10-CM

## 2016-08-29 DIAGNOSIS — Z794 Long term (current) use of insulin: Secondary | ICD-10-CM | POA: Diagnosis not present

## 2016-08-29 DIAGNOSIS — I951 Orthostatic hypotension: Secondary | ICD-10-CM | POA: Insufficient documentation

## 2016-08-29 DIAGNOSIS — E119 Type 2 diabetes mellitus without complications: Secondary | ICD-10-CM | POA: Insufficient documentation

## 2016-08-29 DIAGNOSIS — Y939 Activity, unspecified: Secondary | ICD-10-CM | POA: Insufficient documentation

## 2016-08-29 DIAGNOSIS — Z7982 Long term (current) use of aspirin: Secondary | ICD-10-CM | POA: Diagnosis not present

## 2016-08-29 LAB — CBC WITH DIFFERENTIAL/PLATELET
Basophils Absolute: 0 10*3/uL (ref 0.0–0.1)
Basophils Relative: 0 %
Eosinophils Absolute: 0 10*3/uL (ref 0.0–0.7)
Eosinophils Relative: 0 %
HCT: 36.5 % (ref 36.0–46.0)
Hemoglobin: 11.8 g/dL — ABNORMAL LOW (ref 12.0–15.0)
LYMPHS ABS: 0.6 10*3/uL — AB (ref 0.7–4.0)
LYMPHS PCT: 8 %
MCH: 28.7 pg (ref 26.0–34.0)
MCHC: 32.3 g/dL (ref 30.0–36.0)
MCV: 88.8 fL (ref 78.0–100.0)
MONO ABS: 0.3 10*3/uL (ref 0.1–1.0)
Monocytes Relative: 4 %
Neutro Abs: 5.8 10*3/uL (ref 1.7–7.7)
Neutrophils Relative %: 88 %
PLATELETS: 185 10*3/uL (ref 150–400)
RBC: 4.11 MIL/uL (ref 3.87–5.11)
RDW: 13.6 % (ref 11.5–15.5)
WBC: 6.7 10*3/uL (ref 4.0–10.5)

## 2016-08-29 LAB — COMPREHENSIVE METABOLIC PANEL
ALT: 21 U/L (ref 14–54)
ANION GAP: 9 (ref 5–15)
AST: 25 U/L (ref 15–41)
Albumin: 4 g/dL (ref 3.5–5.0)
Alkaline Phosphatase: 87 U/L (ref 38–126)
BUN: 25 mg/dL — ABNORMAL HIGH (ref 6–20)
CHLORIDE: 105 mmol/L (ref 101–111)
CO2: 26 mmol/L (ref 22–32)
CREATININE: 1.62 mg/dL — AB (ref 0.44–1.00)
Calcium: 9.5 mg/dL (ref 8.9–10.3)
GFR calc non Af Amer: 29 mL/min — ABNORMAL LOW (ref >60)
GFR, EST AFRICAN AMERICAN: 33 mL/min — AB (ref >60)
Glucose, Bld: 253 mg/dL — ABNORMAL HIGH (ref 65–99)
Potassium: 4.2 mmol/L (ref 3.5–5.1)
Sodium: 140 mmol/L (ref 135–145)
Total Bilirubin: 0.5 mg/dL (ref 0.3–1.2)
Total Protein: 8 g/dL (ref 6.5–8.1)

## 2016-08-29 LAB — URINALYSIS, ROUTINE W REFLEX MICROSCOPIC
Bacteria, UA: NONE SEEN
Bilirubin Urine: NEGATIVE
Glucose, UA: >=500 mg/dL — AB
Ketones, ur: NEGATIVE mg/dL
Leukocytes, UA: NEGATIVE
Nitrite: NEGATIVE
Protein, ur: NEGATIVE mg/dL
Specific Gravity, Urine: 1.011 (ref 1.005–1.030)
pH: 6 (ref 5.0–8.0)

## 2016-08-29 LAB — TROPONIN I
TROPONIN I: 0.04 ng/mL — AB (ref <0.03)
Troponin I: 0.04 ng/mL

## 2016-08-29 MED ORDER — SODIUM CHLORIDE 0.9 % IV BOLUS (SEPSIS)
500.0000 mL | Freq: Once | INTRAVENOUS | Status: AC
  Administered 2016-08-29: 500 mL via INTRAVENOUS

## 2016-08-29 NOTE — ED Notes (Signed)
Dr Lita Mains apprised of BP

## 2016-08-29 NOTE — ED Notes (Signed)
Per grandson, pt fell last night going to the bathroom. She has a approx 2 in circular swelling to her occipital area. She answers questions and reports that she is dizzy. Per grandson, he believes pt has dementia and has not been diagnosed, has diminished appetite and is losing weight  Dr Legrand Rams is her physician

## 2016-08-29 NOTE — ED Notes (Signed)
Critical troponin 0.04 Dr Darreld Mclean notified

## 2016-08-29 NOTE — ED Notes (Signed)
From CT 

## 2016-08-29 NOTE — ED Notes (Signed)
Fell yesterday- today with dizziness and knot on head Here for evaluation

## 2016-08-29 NOTE — ED Triage Notes (Addendum)
Pt states she fell last evening while trying to get to the bathroom and lost her balance and landed flat on her back, hitting her head.  Pt dines loss of consciousness.  States she is having dizziness and weakness.  Weakness is ongoing dizziness in new.  Hematoma noted to back of head, beneath intact skin.

## 2016-08-29 NOTE — ED Provider Notes (Signed)
[facility] DEPT Provider Note   CSN: [id number] Arrival date & time: 08/29/16  1516     History   Chief Complaint Chief Complaint  Patient presents with  . Fall    HPI Amy Stein is a 83 y.o. female.  HPI Patient states she got up to go to the bathroom yesterday evening and lightheaded and fell. States she struck the back of her head but had no loss of consciousness. Had swelling to the posterior scalp which has improved since the fall. She denies any headache or vision changes. Denies any neck pain. States she continues to have lightheadedness with standing. She also reports generalized fatigue. Recently started on medication for "bladder issue". Denies dysuria, hematuria or hesitancy. Denies vomiting or diarrhea. No point had chest pain or shortness of breath. Past Medical History:  Diagnosis Date  . Diabetes mellitus   . Hypertension   . Shingles    on both legs 1/18    Patient Active Problem List   Diagnosis Date Noted  . TEAR MEDIAL MENISCUS 09/04/2008  . KNEE PAIN, RIGHT 07/03/2008  . FRACTURE, ANKLE, LEFT 05/14/2008    No past surgical history on file.  OB History    Gravida Para Term Preterm AB Living   4 4       4    SAB TAB Ectopic Multiple Live Births                   Home Medications    Prior to Admission medications   Medication Sig Start Date End Date Taking? Authorizing Provider  amLODipine (NORVASC) 5 MG tablet Take 5 mg by mouth daily.   Yes [provider]  aspirin EC 81 MG tablet Take 81 mg by mouth daily.   Yes [provider]  gabapentin (NEURONTIN) 300 MG capsule Take 300 mg by mouth 3 (three) times daily.    Yes [provider]  glipiZIDE (GLUCOTROL XL) 10 MG 24 hr tablet Take 10 mg by mouth daily with breakfast.   Yes [provider]  insulin glargine (LANTUS) 100 unit/mL SOPN Inject 30 Units into the skin at bedtime.   Yes [provider]  labetalol (NORMODYNE) 300 MG tablet Take 300 mg  by mouth 2 (two) times daily.   Yes [provider]  medroxyPROGESTERone (PROVERA) 10 MG tablet Take 10 mg by mouth daily.   Yes [provider]  omeprazole (PRILOSEC) 20 MG capsule Take 20 mg by mouth daily.   Yes [provider]  oxybutynin (DITROPAN) 5 MG tablet Take 5 mg by mouth daily.   Yes [provider]  simvastatin (ZOCOR) 40 MG tablet Take 40 mg by mouth at bedtime.    Yes [provider]  sitaGLIPtin (JANUVIA) 100 MG tablet Take 100 mg by mouth daily.   Yes [provider]  tolterodine (DETROL LA) 4 MG 24 hr capsule Take 4 mg by mouth daily.   Yes [provider]  valsartan (DIOVAN) 320 MG tablet Take 320 mg by mouth daily.   Yes [provider]    Family History Family History  Problem Relation Age of Onset  . Throat cancer Father   . Pneumonia Mother   . Pancreatic cancer Brother     Social History Social History  Substance Use Topics  . Smoking status: Former Research scientist (life sciences)  . Smokeless tobacco: Never Used  . Alcohol use No     Allergies   Sulfonamide derivatives   Review of Systems Review of  Systems  Constitutional: Positive for fatigue. Negative for chills and fever.  HENT: Negative for facial swelling, trouble swallowing and voice change.   Eyes: Negative for visual disturbance.  Respiratory: Negative for cough and shortness of breath.   Cardiovascular: Negative for chest pain, palpitations and leg swelling.  Gastrointestinal: Negative for abdominal pain, constipation, diarrhea, nausea and vomiting.  Genitourinary: Negative for difficulty urinating, dysuria, flank pain and hematuria.  Musculoskeletal: Negative for back pain, myalgias and neck pain.  Skin: Positive for wound. Negative for rash.  Neurological: Positive for dizziness, weakness (generalized) and light-headedness. Negative for syncope, numbness and headaches.  All other systems reviewed and are negative.    Physical  Exam Updated Vital Signs BP (!) 183/89   Pulse 61   Temp 98.6 F (37 C) (Oral)   Resp 16   Ht 5\' 2"  (1.575 m)   Wt 70.3 kg (155 lb)   SpO2 100%   BMI 28.35 kg/m   Physical Exam  Constitutional: She is oriented to person, place, and time. She appears well-developed and well-nourished. No distress.  HENT:  Head: Normocephalic.  Mouth/Throat: Oropharynx is clear and moist. No oropharyngeal exudate.  Patient has small left posterior scalp hematoma. No hemotympanum. Midface is stable.  Eyes: EOM are normal. Pupils are equal, round, and reactive to light.  Pinpoint pupils bilaterally. No nystagmus noted  Neck: Normal range of motion. Neck supple.  No posterior midline cervical tenderness to palpation.  Cardiovascular: Normal rate and regular rhythm.  Exam reveals no gallop and no friction rub.   No murmur heard. Pulmonary/Chest: Effort normal and breath sounds normal. No respiratory distress. She has no wheezes. She has no rales. She exhibits no tenderness.  Abdominal: Soft. Bowel sounds are normal. There is no tenderness. There is no rebound and no guarding.  Musculoskeletal: Normal range of motion. She exhibits no edema or tenderness.  No midline thoracic or lumbar tenderness. No lower extremity swelling or asymmetry. Distal pulses are 2+.  Neurological: She is alert and oriented to person, place, and time.  Patient is alert and oriented x3 with clear, goal oriented speech. Patient has 5/5 motor in all extremities. Sensation is intact to light touch. Bilateral finger-to-nose is normal with no signs of dysmetria.   Skin: Skin is warm and dry. Capillary refill takes less than 2 seconds. No rash noted. No erythema.  Psychiatric: She has a normal mood and affect. Her behavior is normal.  Nursing note and vitals reviewed.    ED Treatments / Results  Labs (all labs ordered are listed, but only abnormal results are displayed) Labs Reviewed  CBC WITH DIFFERENTIAL/PLATELET - Abnormal;  Notable for the following:       Result Value   Hemoglobin 11.8 (*)    Lymphs Abs 0.6 (*)    All other components within normal limits  COMPREHENSIVE METABOLIC PANEL - Abnormal; Notable for the following:    Glucose, Bld 253 (*)    BUN 25 (*)    Creatinine, Ser 1.62 (*)    GFR calc non Af Amer 29 (*)    GFR calc Af Amer 33 (*)    All other components within normal limits  URINALYSIS, ROUTINE W REFLEX MICROSCOPIC - Abnormal; Notable for the following:    Glucose, UA >=500 (*)    Hgb urine dipstick SMALL (*)    Squamous Epithelial / LPF 0-5 (*)    All other components within normal limits  TROPONIN I - Abnormal; Notable for the following:  Troponin I 0.04 (*)    All other components within normal limits  TROPONIN I - Abnormal; Notable for the following:    Troponin I 0.04 (*)    All other components within normal limits    EKG  EKG Interpretation  Date/Time:  Sunday Aug 29 2016 16:08:36 EDT Ventricular Rate:  69 PR Interval:    QRS Duration: 93 QT Interval:  400 QTC Calculation: 429 R Axis:   42 Text Interpretation:  Sinus rhythm Abnormal R-wave progression, early transition Nonspecific T abnormalities, diffuse leads Baseline wander in lead(s) V1 V2 Confirmed by Lita Mains  MD, Alessandro Griep ([id number]) on 08/29/2016 4:16:11 PM       Radiology Ct Head Wo Contrast  Result Date: 08/29/2016 CLINICAL DATA:  Syncopal episode yesterday injuring back of head. EXAM: CT HEAD WITHOUT CONTRAST TECHNIQUE: Contiguous axial images were obtained from the base of the skull through the vertex without intravenous contrast. COMPARISON:  None. FINDINGS: Brain: Ventricles, cisterns and other CSF spaces are within normal. There is mild chronic ischemic microvascular disease. No focal mass, mass effect, shift of midline structures or acute hemorrhage. No evidence of acute infarction. Vascular: No hyperdense vessel or unexpected calcification. Skull: Within normal. Sinuses/Orbits: Orbits are within normal.  Opacification of the right maxillary sinus and minimally over the right ethmoid air cells. Other: None. IMPRESSION: No acute intracranial findings. Mild chronic ischemic microvascular disease. Chronic sinus inflammatory change. Electronically Signed   By: Marin Olp M.D.   On: 08/29/2016 16:39    Procedures Procedures (including critical care time)  Medications Ordered in ED Medications  sodium chloride 0.9 % bolus 500 mL (0 mLs Intravenous Stopped 08/29/16 2031)  sodium chloride 0.9 % bolus 500 mL (0 mLs Intravenous Stopped 08/29/16 1743)     Initial Impression / Assessment and Plan / ED Course  I have reviewed the triage vital signs and the nursing notes.  Pertinent labs & imaging results that were available during my care of the patient were reviewed by me and considered in my medical decision making (see chart for details).     Patient with orthostasis and fall. Has a normal neurologic exam. Patient states she is feeling much better after IV fluids. Initial troponin is 0.04. Again denies any chest pain or shortness of breath. Repeat troponin without change. Discuss diagnoses with patient and patient's grandson. Likely dehydration due to lack of blood sugar control. Advised to follow-up closely with her primary physician. Return precautions given. Final Clinical Impressions(s) / ED Diagnoses   Final diagnoses:  Fall, initial encounter  Contusion of scalp, initial encounter  Orthostasis  Dehydration  Renal insufficiency, mild    New Prescriptions Discharge Medication List as of 08/29/2016  9:30 PM       Julianne Rice, MD 08/30/16 1501

## 2016-09-20 ENCOUNTER — Ambulatory Visit (HOSPITAL_COMMUNITY): Payer: Medicare Other

## 2016-09-20 ENCOUNTER — Ambulatory Visit (HOSPITAL_COMMUNITY)
Admission: RE | Admit: 2016-09-20 | Discharge: 2016-09-20 | Disposition: A | Discharge status: Home / Self Care | Payer: Medicare Other | Source: Ambulatory Visit | Attending: Internal Medicine | Admitting: Internal Medicine

## 2016-09-20 DIAGNOSIS — Z1231 Encounter for screening mammogram for malignant neoplasm of breast: Secondary | ICD-10-CM | POA: Diagnosis not present

## 2016-10-12 NOTE — Progress Notes (Signed)
Formatting of this note is different from the original.    [place] [address]  [facility] [place]  [address]  [address]  [place] [address]  Dept: [phone] Clinical Visit Note        CHIEF COMPLAINT  Patient presents for Diabetic Eye Exam    HISTORY OF PRESENT ILLNESS:  Sharon Roman is a 83 y.o. old female who presents to the clinic today for:     HPI     Diabetic Eye Exam   I, the attending physician, performed the HPI with the patient and updated the documentation appropriately.  The patient is here for a return visit.  The patient is here for a diabetic eye exam. The patient has Type 2 Diabetes.  The patient's treatment for diabetes includes insulin and oral medications.  The patient has been diabetic for years.  The patient's blood sugar is uncontrolled.  The patient checks their blood sugar at home.  The patient's blood sugar this morning was 234.  The patient's last eye exam: 1 year ago.  The patient reports that vision fluctuates with blood sugar.  The patient requests a new glasses prescription.  Review of eye symptoms reveals: Negative for flashes, eye pain and floaters.      Last edited by Christia Reading, MD on 10/12/2016 10:24 AM. (History)       HISTORICAL INFORMATION:     CURRENT MEDICATIONS:    Current Outpatient Prescriptions:   ?  amLODIPine (NORVASC) 5 MG tablet, Take by mouth., Disp: , Rfl:   ?  aspirin 81 MG EC tablet *ANTIPLATELET*, Take by mouth., Disp: , Rfl:   ?  gabapentin (NEURONTIN) 300 MG capsule, , Disp: , Rfl:   ?  glipiZIDE XL (GLUCOTROL XL) 10 MG 24 hr tablet, , Disp: , Rfl:   ?  insulin glargine (LANTUS) 100 unit/mL (3 mL) injection, Inject 30 Units into the skin., Disp: , Rfl:   ?  labetalol (NORMODYNE) 300 MG tablet, Take by mouth., Disp: , Rfl:   ?  oxybutynin (DITROPAN) 5 MG tablet, , Disp: , Rfl:   ?  valsartan (DIOVAN) 160 MG tablet, , Disp: , Rfl:     Referring physician:  A Referral Self  No address on file    REVIEW OF SYSTEMS:  Delfina Redwood, COA  10/12/2016  9:56 AM  Sign at close encounter  Review of Systems   All other systems reviewed and are negative.    ALLERGIES  Allergies   Allergen Reactions   ? Sulfasalazine Rash (ALLERGY/intolerance)     PAST MEDICAL HISTORY  Past Medical History:   Diagnosis Date   ? Cataract    ? Diabetes mellitus (HCC)     type2 insulin/oralmeds   ? Hypertension      History reviewed. No pertinent surgical history.    FAMILY HISTORY  Family History   Problem Relation Age of Onset   ? Diabetes Brother      SOCIAL HISTORY  Social History   Substance Use Topics   ? Smoking status: Never Smoker   ? Smokeless tobacco: Never Used   ? Alcohol use No           OPHTHALMIC EXAM:  Base Eye Exam     Visual Acuity (Snellen - Linear)       Right Left    Dist cc 20/80 20/50    Near sc J3 J3    Correction:  Glasses  Tonometry (Tonopen, 10:08 AM)       Right Left    Pressure 13 14        Pupils       Pupils    Right PERRL    Left PERRL        Visual Fields       Left Right     Full         Extraocular Movement       Right Left     Full, Ortho Full, Ortho        Neuro/Psych     Oriented x3:  Yes    Mood/Affect:  Normal        Dilation     Both eyes:  1.0% Tropicamide (Mydriacyl) @ 10:08 AM    Open angles OU         Slit Lamp and Fundus Exam     External Exam       Right Left    External Normal Normal        Slit Lamp Exam       Right Left    Lids/Lashes Normal Normal    Conjunctiva/Sclera White and quiet White and quiet    Cornea Clear Clear    Anterior Chamber Deep and quiet Deep and quiet    Iris Round and reactive Round and reactive    Lens 2+ Nuclear sclerosis 2+ Nuclear sclerosis        Fundus Exam       Right Left    Vitreous Normal Normal    Disc Normal Normal    C/D Ratio 0.4 0.4    Macula Normal Normal    Vessels no Retinopathy no Retinopathy    Periphery Normal Normal         Refraction     Manifest Refraction       Sphere Cylinder Axis Dist VA Add    Right -0.25 +2.50 075 20/25 +2.50    Left +0.50 +0.75 170 20/30  +2.50    BS in 200s today refraction may change with BS -vision varies .           IMAGING AND PROCEDURES:        ASSESSMENT/PLAN:    1. Diabetes mellitus without complication (HCC)     2. Encounter for long-term (current) use of insulin (HCC)     3. Nuclear age-related cataract, both eyes     4. Myopia of right eye with astigmatism and presbyopia     5. Hyperopia with astigmatism and presbyopia, left       Ophthalmic Meds Ordered this visit:   There were no meds ordered this visit.        Return for 1 Yr CE - Diabetic.    There are no Patient Instructions on file for this visit.    Cataracts OU - not visually significant.  DM Type II on insulin - No Diabetic Retinopathy.  Explained the diagnoses, plan, and follow up with the patient and they expressed understanding. Diabetic note sent to MD. All questions answered.    Abbreviations:  M myopia (nearsighted); A astigmatism; H hyperopia (farsighted); P presbyopia; Mrx spectacle prescription;  CTL contact lenses; OD right eye; OS left eye; OU both eyes  XT exotropia; ET esotropia; PEK punctate epithelial keratitis; PEE punctate epithelial erosions; DES dry eye syndrome; MGD meibomian gland dysfunction; ATs artificial tears; PFAT's preservative free artificial tears; NSC nuclear sclerotic cataract; PSC posterior subcapsular cataract; ERM epi-retinal membrane; PVD posterior  vitreous detachment; RD retinal detachment; DM diabetes mellitus; DR diabetic retinopathy; NPDR non-proliferative diabetic retinopathy; PDR proliferative diabetic retinopathy; CSME clinically significant macular edema; DME diabetic macular edema; dbh dot blot hemorrhages; CWS cotton wool spot; POAG primary open angle glaucoma; C/D cup-to-disc ratio; HVF humphrey visual field; GVF goldmann visual field; OCT optical coherence tomography; IOP intraocular pressure; BRVO Branch retinal vein occlusion; CRVO central retinal vein occlusion; CRAO central retinal artery occlusion; BRAO branch retinal artery  occlusion; RT retinal tear; SB scleral buckle; PPV pars plana vitrectomy; VH Vitreous hemorrhage; PRP panretinal laser photocoagulation; IVK intravitreal kenalog; VMT vitreomacular traction; MH Macular hole;  NVD neovascularization of the disc; NVE neovascularization elsewhere; AREDS age related eye disease study; ARMD age related macular degeneration; POAG primary open angle glaucoma; EBMD epithelial/anterior basement membrane dystrophy; ACIOL anterior chamber intraocular lens; IOL intraocular lens; PCIOL posterior chamber intraocular lens; Phaco/IOL phacoemulsification with intraocular lens placement; PRK photorefractive keratectomy; LASIK laser assisted in situ keratomileusis; HTN hypertension; DM diabetes mellitus; COPD chronic obstructive pulmonary disease    This document serves as a record of services personally performed by Exie ParodyMark T Shapiro, MD.  It was created on their behalf by Thomes CakeKristy Bunn Lankford, COT, a trained medical scribe, and Certified Ophthalmic Tech (COT). During the course of documenting the history, physical exam and medical decision making, I was functioning as a Stage managermedical scribe. The creation of this record is the provider?s dictation and/or activities during the visit.    Electronically signed by Thomes CakeKristy Bunn Lankford, COT 10/12/2016 10:27 AM    I agree the documentation is accurate and complete.    Electronically signed by: Christia ReadingMark Thomas Shapiro, MD 10/12/2016 10:28 AM       Electronically signed by: Christia ReadingMark Thomas Shapiro, MD  10/12/16 1028    Electronically signed by Christia ReadingShapiro, Mark Thomas, MD at 10/12/2016 10:28 AM EDT

## 2016-10-12 NOTE — Progress Notes (Signed)
Formatting of this note might be different from the original.  Review of Systems   All other systems reviewed and are negative.      Electronically signed by: Christia ReadingMark Thomas Shapiro, MD  10/12/16 1028    Electronically signed by Delfina RedwoodLarow, Dodie Lynn, COA at 10/12/2016 10:28 AM EDT

## 2016-11-18 ENCOUNTER — Telehealth: Payer: Self-pay | Admitting: *Deleted

## 2016-11-18 NOTE — Telephone Encounter (Signed)
We received a refill request for Medroxyprogesterone 10 mg # 90. Ok per JAG with 3 refills. Verbal order given to pharmacy. [place]

## 2016-12-22 ENCOUNTER — Telehealth: Payer: Self-pay | Admitting: Internal Medicine

## 2016-12-22 NOTE — Telephone Encounter (Signed)
RECALL FOR TCS °

## 2016-12-22 NOTE — Telephone Encounter (Signed)
Letter mailed to pt.  

## 2017-01-03 NOTE — Telephone Encounter (Signed)
Pt called office. She received recall letter from DS for colonoscopy. She can be reached at [phone].  Routing to DS.

## 2017-01-05 ENCOUNTER — Telehealth: Payer: Self-pay

## 2017-01-05 NOTE — Telephone Encounter (Signed)
Correction- patient called on 9/24 and letter was mailed on 9/12

## 2017-01-05 NOTE — Telephone Encounter (Signed)
Pt called on 9/12 and again today. She received a letter to set up her 10 yr colonoscopy. She isn't having any GI problems, no blood thinners and no hx of heart attack in the past year. Please call her at [phone]

## 2017-01-06 ENCOUNTER — Telehealth: Payer: Self-pay

## 2017-01-06 NOTE — Telephone Encounter (Addendum)
Gastroenterology Pre-Procedure Review  Request Date: 01/06/2017 Requesting Physician: RECALL  PATIENT REVIEW QUESTIONS: The patient responded to the following health history questions as indicated:    Last colonoscopy was 02/01/2007 by Dr. Gala Romney Pt is not having any rectal bleeding and no problems with her bowels  1. Diabetes Melitis:YES 2. Joint replacements in the past 12 months: no 3. Major health problems in the past 3 months: no 4. Has an artificial valve or MVP: no 5. Has a defibrillator: no 6. Has been advised in past to take antibiotics in advance of a procedure like teeth cleaning: no 7. Family history of colon cancer: no  8. Alcohol Use: no 9. History of sleep apnea: no  10. History of coronary artery or other vascular stents placed within the last 12 months: no 11. History of any prior anesthesia complications: no    MEDICATIONS & ALLERGIES:    Patient reports the following regarding taking any blood thinners:   Plavix? no Aspirin? YES Coumadin? no Brilinta? no Xarelto? no Eliquis? no Pradaxa? no Savaysa? no Effient? no  Patient confirms/reports the following medications:  Current Outpatient Prescriptions  Medication Sig Dispense Refill  . aspirin EC 81 MG tablet Take 81 mg by mouth daily.    Marland Kitchen gabapentin (NEURONTIN) 300 MG capsule Take 300 mg by mouth 3 (three) times daily.     Marland Kitchen glipiZIDE (GLUCOTROL XL) 10 MG 24 hr tablet Take 10 mg by mouth daily with breakfast.    . hydrochlorothiazide (MICROZIDE) 12.5 MG capsule Take 12.5 mg by mouth daily.    . insulin glargine (LANTUS) 100 unit/mL SOPN Inject 40 Units into the skin at bedtime.     Marland Kitchen labetalol (NORMODYNE) 300 MG tablet Take 100 mg by mouth 2 (two) times daily.     Marland Kitchen losartan (COZAAR) 100 MG tablet Take 100 mg by mouth daily.    . medroxyPROGESTERone (PROVERA) 10 MG tablet Take 10 mg by mouth daily.    Marland Kitchen omeprazole (PRILOSEC) 20 MG capsule Take 20 mg by mouth daily.    Marland Kitchen oxybutynin (DITROPAN) 5 MG tablet  Take 5 mg by mouth daily.    . simvastatin (ZOCOR) 40 MG tablet Take 40 mg by mouth at bedtime.     . sitaGLIPtin (JANUVIA) 100 MG tablet Take 100 mg by mouth daily.     No current facility-administered medications for this visit.     Patient confirms/reports the following allergies:  Allergies  Allergen Reactions  . Sulfonamide Derivatives Rash    No orders of the defined types were placed in this encounter.   AUTHORIZATION INFORMATION Primary Insurance:   ID #:   Group #:  Pre-Cert / Auth required:  Pre-Cert / Auth #:   Secondary Insurance:   ID #:   Group #:  Pre-Cert / Auth required: Pre-Cert / Auth #:   SCHEDULE INFORMATION: Procedure has been scheduled as follows:  Date:                 Time:   Location:   This Gastroenterology Pre-Precedure Review Form is being routed to the following provider(s): R. Garfield Cornea, MD

## 2017-01-06 NOTE — Telephone Encounter (Signed)
See separate triage.  

## 2017-01-06 NOTE — Telephone Encounter (Signed)
See previous call

## 2017-01-09 NOTE — Telephone Encounter (Signed)
Ok to schedule if patient desires. Will likely be last one offered for screening purposes.   Day before procedure: glipizide 5mg  daily, lantus 20 units at bedtime, januvia 50mg  daily.   Day of procedure: no diabetes meds.

## 2017-01-10 NOTE — Telephone Encounter (Signed)
PT is diabetic needs morning appt. Will call her when get the Nov schedule.

## 2017-01-19 ENCOUNTER — Other Ambulatory Visit: Payer: Self-pay

## 2017-01-19 DIAGNOSIS — Z1211 Encounter for screening for malignant neoplasm of colon: Secondary | ICD-10-CM

## 2017-01-19 MED ORDER — NA SULFATE-K SULFATE-MG SULF 17.5-3.13-1.6 GM/177ML PO SOLN: 1.0000 | ORAL | 0 refills | Status: DC

## 2017-01-19 NOTE — Addendum Note (Signed)
Addended by: Everardo All on: 01/19/2017 11:38 AM   Modules accepted: Orders

## 2017-02-18 NOTE — Telephone Encounter (Signed)
Pt was scheduled for 02/23/2017 at 9:30 Am with Dr. Gala Romney. She received her instructions and has picked up her prep. However, she had questions about her diabetic meds. The Glipizide and Januvia are not scorable. I spoke to Neil Crouch, PA who said if pt is concerned about not being able to cut the tablets, she may take a whole glipizide on Am of prep and NO Januvia. I wrote it on her instructions for her. Pt said she is trying to get out of doing the colonoscopy, but she will probably do it. She will let us know on Monday if she plans to cancel so we will be able to add someone to the schedule.

## 2017-02-18 NOTE — Telephone Encounter (Signed)
Pt is aware and will let us know by Monday Am if she does not want to proceed.

## 2017-02-18 NOTE — Telephone Encounter (Signed)
NOTED. Patient is 83 y/o, this is a screening exam. Guidelines are grey as far as when to stop screening colonoscopy. It is her decision.

## 2017-02-23 ENCOUNTER — Encounter (HOSPITAL_COMMUNITY)
Admission: RE | Disposition: A | Discharge status: Home / Self Care | Payer: Self-pay | Source: Ambulatory Visit | Attending: Internal Medicine

## 2017-02-23 ENCOUNTER — Other Ambulatory Visit: Payer: Self-pay

## 2017-02-23 ENCOUNTER — Ambulatory Visit (HOSPITAL_COMMUNITY)
Admission: RE | Admit: 2017-02-23 | Discharge: 2017-02-23 | Disposition: A | Discharge status: Home / Self Care | Payer: Medicare Other | Source: Ambulatory Visit | Attending: Internal Medicine | Admitting: Internal Medicine

## 2017-02-23 ENCOUNTER — Encounter (HOSPITAL_COMMUNITY): Payer: Self-pay | Admitting: *Deleted

## 2017-02-23 DIAGNOSIS — Z79899 Other long term (current) drug therapy: Secondary | ICD-10-CM | POA: Insufficient documentation

## 2017-02-23 DIAGNOSIS — I10 Essential (primary) hypertension: Secondary | ICD-10-CM | POA: Insufficient documentation

## 2017-02-23 DIAGNOSIS — Z87891 Personal history of nicotine dependence: Secondary | ICD-10-CM | POA: Insufficient documentation

## 2017-02-23 DIAGNOSIS — Z800 Family history of malignant neoplasm of digestive organs: Secondary | ICD-10-CM | POA: Diagnosis not present

## 2017-02-23 DIAGNOSIS — Z1211 Encounter for screening for malignant neoplasm of colon: Secondary | ICD-10-CM

## 2017-02-23 DIAGNOSIS — Z8619 Personal history of other infectious and parasitic diseases: Secondary | ICD-10-CM | POA: Insufficient documentation

## 2017-02-23 DIAGNOSIS — Z7982 Long term (current) use of aspirin: Secondary | ICD-10-CM | POA: Diagnosis not present

## 2017-02-23 DIAGNOSIS — Z882 Allergy status to sulfonamides status: Secondary | ICD-10-CM | POA: Diagnosis not present

## 2017-02-23 DIAGNOSIS — Z1212 Encounter for screening for malignant neoplasm of rectum: Secondary | ICD-10-CM | POA: Diagnosis not present

## 2017-02-23 DIAGNOSIS — E119 Type 2 diabetes mellitus without complications: Secondary | ICD-10-CM | POA: Diagnosis not present

## 2017-02-23 DIAGNOSIS — Z794 Long term (current) use of insulin: Secondary | ICD-10-CM | POA: Diagnosis not present

## 2017-02-23 DIAGNOSIS — D120 Benign neoplasm of cecum: Secondary | ICD-10-CM | POA: Insufficient documentation

## 2017-02-23 HISTORY — PX: COLONOSCOPY: SHX5424

## 2017-02-23 HISTORY — PX: POLYPECTOMY: SHX5525

## 2017-02-23 LAB — GLUCOSE, CAPILLARY: Glucose-Capillary: 202 mg/dL — ABNORMAL HIGH (ref 65–99)

## 2017-02-23 SURGERY — COLONOSCOPY
Anesthesia: Moderate Sedation

## 2017-02-23 MED ORDER — MEPERIDINE HCL 100 MG/ML IJ SOLN
INTRAMUSCULAR | Status: DC | PRN
  Administered 2017-02-23: 25 mg via INTRAVENOUS

## 2017-02-23 MED ORDER — MEPERIDINE HCL 100 MG/ML IJ SOLN
INTRAMUSCULAR | Status: AC
  Filled 2017-02-23: qty 2

## 2017-02-23 MED ORDER — MIDAZOLAM HCL 5 MG/5ML IJ SOLN
INTRAMUSCULAR | Status: AC
  Filled 2017-02-23: qty 10

## 2017-02-23 MED ORDER — ONDANSETRON HCL 4 MG/2ML IJ SOLN
INTRAMUSCULAR | Status: DC | PRN
  Administered 2017-02-23: 4 mg via INTRAVENOUS

## 2017-02-23 MED ORDER — SODIUM CHLORIDE 0.9 % IV SOLN
INTRAVENOUS | Status: DC
  Administered 2017-02-23: 1000 mL via INTRAVENOUS

## 2017-02-23 MED ORDER — ONDANSETRON HCL 4 MG/2ML IJ SOLN
INTRAMUSCULAR | Status: AC
  Filled 2017-02-23: qty 2

## 2017-02-23 MED ORDER — MIDAZOLAM HCL 5 MG/5ML IJ SOLN
INTRAMUSCULAR | Status: DC | PRN
  Administered 2017-02-23: 1 mg via INTRAVENOUS
  Administered 2017-02-23: 2 mg via INTRAVENOUS

## 2017-02-23 MED ORDER — STERILE WATER FOR IRRIGATION IR SOLN
Status: DC | PRN
  Administered 2017-02-23: 2.5 mL

## 2017-02-23 NOTE — Discharge Instructions (Signed)
Colonoscopy Discharge Instructions  Read the instructions outlined below and refer to this sheet in the next few weeks. These discharge instructions provide you with general information on caring for yourself after you leave the hospital. Your doctor may also give you specific instructions. While your treatment has been planned according to the most current medical practices available, unavoidable complications occasionally occur. If you have any problems or questions after discharge, call Dr. Gala Romney at [phone]. ACTIVITY  You may resume your regular activity, but move at a slower pace for the next 24 hours.   Take frequent rest periods for the next 24 hours.   Walking will help get rid of the air and reduce the bloated feeling in your belly (abdomen).   No driving for 24 hours (because of the medicine (anesthesia) used during the test).    Do not sign any important legal documents or operate any machinery for 24 hours (because of the anesthesia used during the test).  NUTRITION  Drink plenty of fluids.   You may resume your normal diet as instructed by your doctor.   Begin with a light meal and progress to your normal diet. Heavy or fried foods are harder to digest and may make you feel sick to your stomach (nauseated).   Avoid alcoholic beverages for 24 hours or as instructed.  MEDICATIONS  You may resume your normal medications unless your doctor tells you otherwise.  WHAT YOU CAN EXPECT TODAY  Some feelings of bloating in the abdomen.   Passage of more gas than usual.   Spotting of blood in your stool or on the toilet paper.  IF YOU HAD POLYPS REMOVED DURING THE COLONOSCOPY:  No aspirin products for 7 days or as instructed.   No alcohol for 7 days or as instructed.   Eat a soft diet for the next 24 hours.  FINDING OUT THE RESULTS OF YOUR TEST Not all test results are available during your visit. If your test results are not back during the visit, make an appointment  with your caregiver to find out the results. Do not assume everything is normal if you have not heard from your caregiver or the medical facility. It is important for you to follow up on all of your test results.  SEEK IMMEDIATE MEDICAL ATTENTION IF:  You have more than a spotting of blood in your stool.   Your belly is swollen (abdominal distention).   You are nauseated or vomiting.   You have a temperature over 101.   You have abdominal pain or discomfort that is severe or gets worse throughout the day.    Colonoscopy Discharge Instructions  Read the instructions outlined below and refer to this sheet in the next few weeks. These discharge instructions provide you with general information on caring for yourself after you leave the hospital. Your doctor may also give you specific instructions. While your treatment has been planned according to the most current medical practices available, unavoidable complications occasionally occur. If you have any problems or questions after discharge, call Dr. Gala Romney at [phone]. ACTIVITY  You may resume your regular activity, but move at a slower pace for the next 24 hours.   Take frequent rest periods for the next 24 hours.   Walking will help get rid of the air and reduce the bloated feeling in your belly (abdomen).   No driving for 24 hours (because of the medicine (anesthesia) used during the test).    Do not sign any important legal  documents or operate any machinery for 24 hours (because of the anesthesia used during the test).  NUTRITION  Drink plenty of fluids.   You may resume your normal diet as instructed by your doctor.   Begin with a light meal and progress to your normal diet. Heavy or fried foods are harder to digest and may make you feel sick to your stomach (nauseated).   Avoid alcoholic beverages for 24 hours or as instructed.  MEDICATIONS  You may resume your normal medications unless your doctor tells you otherwise.    WHAT YOU CAN EXPECT TODAY  Some feelings of bloating in the abdomen.   Passage of more gas than usual.   Spotting of blood in your stool or on the toilet paper.  IF YOU HAD POLYPS REMOVED DURING THE COLONOSCOPY:  No aspirin products for 7 days or as instructed.   No alcohol for 7 days or as instructed.   Eat a soft diet for the next 24 hours.  FINDING OUT THE RESULTS OF YOUR TEST Not all test results are available during your visit. If your test results are not back during the visit, make an appointment with your caregiver to find out the results. Do not assume everything is normal if you have not heard from your caregiver or the medical facility. It is important for you to follow up on all of your test results.  SEEK IMMEDIATE MEDICAL ATTENTION IF:  You have more than a spotting of blood in your stool.   Your belly is swollen (abdominal distention).   You are nauseated or vomiting.   You have a temperature over 101.   You have abdominal pain or discomfort that is severe or gets worse throughout the day.    Colon polyp information provided  Further recommendations to follow pending review of pathology report   Colon Polyps Polyps are tissue growths inside the body. Polyps can grow in many places, including the large intestine (colon). A polyp may be a round bump or a mushroom-shaped growth. You could have one polyp or several. Most colon polyps are noncancerous (benign). However, some colon polyps can become cancerous over time. What are the causes? The exact cause of colon polyps is not known. What increases the risk? This condition is more likely to develop in people who:  Have a family history of colon cancer or colon polyps.  Are older than 18 or older than 45 if they are African American.  Have inflammatory bowel disease, such as ulcerative colitis or Crohn disease.  Are overweight.  Smoke cigarettes.  Do not get enough exercise.  Drink too much  alcohol.  Eat a diet that is: ? High in fat and red meat. ? Low in fiber.  Had childhood cancer that was treated with abdominal radiation.  What are the signs or symptoms? Most polyps do not cause symptoms. If you have symptoms, they may include:  Blood coming from your rectum when having a bowel movement.  Blood in your stool.The stool may look dark red or black.  A change in bowel habits, such as constipation or diarrhea.  How is this diagnosed? This condition is diagnosed with a colonoscopy. This is a procedure that uses a lighted, flexible scope to look at the inside of your colon. How is this treated? Treatment for this condition involves removing any polyps that are found. Those polyps will then be tested for cancer. If cancer is found, your health care provider will talk to you about options  for colon cancer treatment. Follow these instructions at home: Diet  Eat plenty of fiber, such as fruits, vegetables, and whole grains.  Eat foods that are high in calcium and vitamin D, such as milk, cheese, yogurt, eggs, liver, fish, and broccoli.  Limit foods high in fat, red meats, and processed meats, such as hot dogs, sausage, bacon, and lunch meats.  Maintain a healthy weight, or lose weight if recommended by your health care provider. General instructions  Do not smoke cigarettes.  Do not drink alcohol excessively.  Keep all follow-up visits as told by your health care provider. This is important. This includes keeping regularly scheduled colonoscopies. Talk to your health care provider about when you need a colonoscopy.  Exercise every day or as told by your health care provider. Contact a health care provider if:  You have new or worsening bleeding during a bowel movement.  You have new or increased blood in your stool.  You have a change in bowel habits.  You unexpectedly lose weight. This information is not intended to replace advice given to you by your health  care provider. Make sure you discuss any questions you have with your health care provider. Document Released: 12/24/2003 Document Revised: 09/04/2015 Document Reviewed: 02/17/2015 Elsevier Interactive Patient Education  [employer].

## 2017-02-23 NOTE — H&P (Signed)
'@LOGO' @   Primary Care Physician:  Rosita Fire, MD Primary Gastroenterologist:  Dr. Gala Romney  Pre-Procedure History & Physical: HPI:  Amy Stein is a 83 y.o. female is here for a screening colonoscopy. No bowel symptoms. No family history of colon cancer. Negative colonoscopy reportedly about 10 years ago.  Past Medical History:  Diagnosis Date  . Diabetes mellitus   . Hypertension   . Shingles    on both legs 1/18    Past Surgical History:  Procedure Laterality Date  . COLONOSCOPY      Prior to Admission medications   Medication Sig Start Date End Date Taking? Authorizing Provider  aspirin EC 81 MG tablet Take 81 mg by mouth daily.   Yes [provider]  glipiZIDE (GLUCOTROL XL) 10 MG 24 hr tablet Take 10 mg by mouth daily with breakfast.   Yes [provider]  insulin glargine (LANTUS) 100 unit/mL SOPN Inject 20 Units at bedtime into the skin.    Yes [provider]  labetalol (NORMODYNE) 100 MG tablet Take 100 mg by mouth 2 (two) times daily.    Yes [provider]  losartan (COZAAR) 100 MG tablet Take 100 mg by mouth daily.   Yes [provider]  medroxyPROGESTERone (PROVERA) 10 MG tablet Take 10 mg See admin instructions by mouth. Take 10 mg by mouth daily for the first 10 days of the month   Yes [provider]  Na Sulfate-K Sulfate-Mg Sulf (SUPREP BOWEL PREP KIT) 17.5-3.13-1.6 GM/180ML SOLN Take 1 kit by mouth as directed. 01/19/17  Yes Mahala Menghini, PA-C  omeprazole (PRILOSEC) 20 MG capsule Take 20 mg by mouth daily.   Yes [provider]  oxybutynin (DITROPAN) 5 MG tablet Take 5 mg by mouth daily.   Yes [provider]  simvastatin (ZOCOR) 40 MG tablet Take 40 mg by mouth at bedtime.    Yes [provider]  sitaGLIPtin (JANUVIA) 100 MG tablet Take 100 mg by mouth daily.   Yes [provider]  gabapentin (NEURONTIN) 300 MG capsule Take 300 mg by mouth 3 (three) times daily.      [provider]  hydrochlorothiazide (MICROZIDE) 12.5 MG capsule Take 12.5 mg by mouth daily.    [provider]    Allergies as of 01/19/2017 - Review Complete 01/06/2017  Allergen Reaction Noted  . Sulfonamide derivatives Rash     Family History  Problem Relation Age of Onset  . Throat cancer Father   . Pneumonia Mother   . Pancreatic cancer Brother     Social History   Socioeconomic History  . Marital status: Single    Spouse name: Not on file  . Number of children: Not on file  . Years of education: Not on file  . Highest education level: Not on file  Social Needs  . Financial resource strain: Not on file  . Food insecurity - worry: Not on file  . Food insecurity - inability: Not on file  . Transportation needs - medical: Not on file  . Transportation needs - non-medical: Not on file  Occupational History  . Not on file  Tobacco Use  . Smoking status: Former Research scientist (life sciences)  . Smokeless tobacco: Never Used  Substance and Sexual Activity  . Alcohol use: No  . Drug use: No  . Sexual activity: Yes    Birth control/protection: Post-menopausal  Other Topics Concern  . Not on file  Social History Narrative  . Not on file  Review of Systems: See HPI, otherwise negative ROS  Physical Exam: BP (!) 169/88   Pulse 80   Temp 98.3 F (36.8 C) (Oral)   Resp 18   Ht '5\' 2"'  (1.575 m)   Wt 150 lb (68 kg)   SpO2 100%   BMI 27.44 kg/m  General:   Alert,  Well-developed, well-nourished, pleasant and cooperative in NAD Lungs:  Clear throughout to auscultation.   No wheezes, crackles, or rhonchi. No acute distress. Heart:  Regular rate and rhythm; no murmurs, clicks, rubs,  or gallops. Abdomen:  Soft, nontender and nondistended. No masses, hepatosplenomegaly or hernias noted. Normal bowel sounds, without guarding, and without rebound.   Impression/Plan: Amy Stein is now here to undergo a screening colonoscopy.  BPL bit. Did not take her blood pressure  medicine today. Desires one more colonoscopy. She remains in fairly good health. This is not unreasonable. I have offered the patient a screening colonoscopy today. Risks, benefits, limitations, imponderables and alternatives regarding colonoscopy have been reviewed with the patient. Questions have been answered. All parties agreeable.     Notice:  This dictation was prepared with Dragon dictation along with smaller phrase technology. Any transcriptional errors that result from this process are unintentional and may not be corrected upon review.

## 2017-02-23 NOTE — Op Note (Signed)
[facility] Patient Name: Amy Stein Procedure Date: 02/23/2017 9:15 AM MRN: [id number] Date of Birth: 03/05/1934 Attending MD: Norvel Richards , MD CSN: [id number] Age: 83 Admit Type: Outpatient Procedure:                Colonoscopy Indications:              Screening for colorectal malignant neoplasm Providers:                Norvel Richards, MD, Lurline Del, RN, Aram Candela Referring MD:              Medicines:                Midazolam 3 mg IV, Meperidine 25 mg IV, Ondansetron                            4 mg IV Complications:            No immediate complications. Estimated Blood Loss:     Estimated blood loss was minimal. Procedure:                Pre-Anesthesia Assessment:                           - Prior to the procedure, a History and Physical                            was performed, and patient medications and                            allergies were reviewed. The patient's tolerance of                            previous anesthesia was also reviewed. The risks                            and benefits of the procedure and the sedation                            options and risks were discussed with the patient.                            All questions were answered, and informed consent                            was obtained. Prior Anticoagulants: The patient has                            taken no previous anticoagulant or antiplatelet                            agents. ASA Grade Assessment: II - A patient with  mild systemic disease. After reviewing the risks                            and benefits, the patient was deemed in                            satisfactory condition to undergo the procedure.                           After obtaining informed consent, the colonoscope                            was passed under direct vision. Throughout the                            procedure, the patient's blood  pressure, pulse, and                            oxygen saturations were monitored continuously. The                            [id number]) scope was introduced through the                            anus and advanced to the the cecum, identified by                            appendiceal orifice and ileocecal valve. The                            colonoscopy was performed without difficulty. The                            patient tolerated the procedure well. The quality                            of the bowel preparation was adequate. The entire                            colon was well visualized. The ileocecal valve,                            appendiceal orifice, and rectum were photographed. Scope In: 9:52:51 AM Scope Out: 10:06:00 AM Scope Withdrawal Time: 0 hours 6 minutes 40 seconds  Total Procedure Duration: 0 hours 13 minutes 9 seconds  Findings:      The perianal and digital rectal examinations were normal.      Two sessile polyps were found in the ileocecal valve. The polyps were 3       to 5 mm in size. These polyps were removed with a cold snare. one of the       polyps was not recovered.. Estimated blood loss was minimal.      The exam was otherwise without abnormality on direct and retroflexion       views. Impression:               -  Two 3 to 5 mm polyps at the ileocecal valve,                            removed with a cold snare. Resected (one of 2                            retrieved).                           - The examination was otherwise normal on direct                            and retroflexion views. Moderate Sedation:      Moderate (conscious) sedation was administered by the endoscopy nurse       and supervised by the endoscopist. The following parameters were       monitored: oxygen saturation, heart rate, blood pressure, respiratory       rate, EKG, adequacy of pulmonary ventilation, and response to care.       Total physician intraservice time was  31 minutes. Recommendation:           - Patient has a contact number available for                            emergencies. The signs and symptoms of potential                            delayed complications were discussed with the                            patient. Return to normal activities tomorrow.                            Written discharge instructions were provided to the                            patient.                           - Advance diet as tolerated. Follow-up on pathology                           - Continue present medications.                           - No repeat colonoscopy due to age.                           - Return to GI clinic PRN. Procedure Code(s):        --- Professional ---                           [phone], Colonoscopy, flexible; with removal of                            tumor(s), polyp(s), or other lesion(s) by  snare                            technique                           99152, Moderate sedation services provided by the                            same physician or other qualified health care                            professional performing the diagnostic or                            therapeutic service that the sedation supports,                            requiring the presence of an independent trained                            observer to assist in the monitoring of the                            patient's level of consciousness and physiological                            status; initial 15 minutes of intraservice time,                            patient age 75 years or older                           [phone], Moderate sedation services; each additional                            15 minutes intraservice time Diagnosis Code(s):        --- Professional ---                           Z12.11, Encounter for screening for malignant                            neoplasm of colon                           D12.0, Benign neoplasm of cecum CPT  copyright 2016 American Medical Association. All rights reserved. The codes documented in this report are preliminary and upon coder review may  be revised to meet current compliance requirements. [name]. [name], MD Norvel Richards, MD 02/23/2017 10:15:56 AM This report has been signed electronically. Number of Addenda: 0

## 2017-02-24 ENCOUNTER — Encounter (HOSPITAL_COMMUNITY): Payer: Self-pay | Admitting: Internal Medicine

## 2017-02-25 ENCOUNTER — Encounter: Payer: Self-pay | Admitting: Internal Medicine

## 2017-05-25 ENCOUNTER — Emergency Department (HOSPITAL_COMMUNITY): Payer: Medicare Other

## 2017-05-25 ENCOUNTER — Encounter (HOSPITAL_COMMUNITY): Payer: Self-pay | Admitting: *Deleted

## 2017-05-25 ENCOUNTER — Other Ambulatory Visit: Payer: Self-pay

## 2017-05-25 ENCOUNTER — Emergency Department (HOSPITAL_COMMUNITY)
Admission: EM | Admit: 2017-05-25 | Discharge: 2017-05-25 | Discharge status: Against Medical Advice | Payer: Medicare Other | Source: Home / Self Care

## 2017-05-25 ENCOUNTER — Encounter (HOSPITAL_COMMUNITY): Payer: Self-pay

## 2017-05-25 ENCOUNTER — Inpatient Hospital Stay (HOSPITAL_COMMUNITY)
Admission: EM | Admit: 2017-05-25 | Discharge: 2017-05-27 | DRG: 193 | Disposition: A | Discharge status: Home Health | Payer: Medicare Other | Attending: Internal Medicine | Admitting: Internal Medicine

## 2017-05-25 DIAGNOSIS — N179 Acute kidney failure, unspecified: Secondary | ICD-10-CM | POA: Diagnosis present

## 2017-05-25 DIAGNOSIS — Z79899 Other long term (current) drug therapy: Secondary | ICD-10-CM

## 2017-05-25 DIAGNOSIS — Z794 Long term (current) use of insulin: Secondary | ICD-10-CM

## 2017-05-25 DIAGNOSIS — Z5321 Procedure and treatment not carried out due to patient leaving prior to being seen by health care provider: Secondary | ICD-10-CM | POA: Insufficient documentation

## 2017-05-25 DIAGNOSIS — E11649 Type 2 diabetes mellitus with hypoglycemia without coma: Secondary | ICD-10-CM | POA: Diagnosis not present

## 2017-05-25 DIAGNOSIS — N390 Urinary tract infection, site not specified: Secondary | ICD-10-CM | POA: Diagnosis not present

## 2017-05-25 DIAGNOSIS — Z87891 Personal history of nicotine dependence: Secondary | ICD-10-CM | POA: Diagnosis not present

## 2017-05-25 DIAGNOSIS — E1122 Type 2 diabetes mellitus with diabetic chronic kidney disease: Secondary | ICD-10-CM | POA: Diagnosis present

## 2017-05-25 DIAGNOSIS — J181 Lobar pneumonia, unspecified organism: Principal | ICD-10-CM | POA: Diagnosis present

## 2017-05-25 DIAGNOSIS — Z882 Allergy status to sulfonamides status: Secondary | ICD-10-CM | POA: Diagnosis not present

## 2017-05-25 DIAGNOSIS — D519 Vitamin B12 deficiency anemia, unspecified: Secondary | ICD-10-CM | POA: Diagnosis present

## 2017-05-25 DIAGNOSIS — I10 Essential (primary) hypertension: Secondary | ICD-10-CM | POA: Diagnosis present

## 2017-05-25 DIAGNOSIS — E538 Deficiency of other specified B group vitamins: Secondary | ICD-10-CM | POA: Diagnosis present

## 2017-05-25 DIAGNOSIS — E871 Hypo-osmolality and hyponatremia: Secondary | ICD-10-CM | POA: Diagnosis present

## 2017-05-25 DIAGNOSIS — D518 Other vitamin B12 deficiency anemias: Secondary | ICD-10-CM | POA: Diagnosis not present

## 2017-05-25 DIAGNOSIS — E162 Hypoglycemia, unspecified: Secondary | ICD-10-CM | POA: Diagnosis not present

## 2017-05-25 DIAGNOSIS — D509 Iron deficiency anemia, unspecified: Secondary | ICD-10-CM | POA: Diagnosis present

## 2017-05-25 DIAGNOSIS — N183 Chronic kidney disease, stage 3 unspecified: Secondary | ICD-10-CM | POA: Diagnosis present

## 2017-05-25 DIAGNOSIS — E860 Dehydration: Secondary | ICD-10-CM | POA: Diagnosis present

## 2017-05-25 DIAGNOSIS — D649 Anemia, unspecified: Secondary | ICD-10-CM | POA: Diagnosis present

## 2017-05-25 DIAGNOSIS — E876 Hypokalemia: Secondary | ICD-10-CM | POA: Diagnosis not present

## 2017-05-25 DIAGNOSIS — G9341 Metabolic encephalopathy: Secondary | ICD-10-CM | POA: Diagnosis not present

## 2017-05-25 DIAGNOSIS — J189 Pneumonia, unspecified organism: Secondary | ICD-10-CM

## 2017-05-25 DIAGNOSIS — R0602 Shortness of breath: Secondary | ICD-10-CM | POA: Insufficient documentation

## 2017-05-25 DIAGNOSIS — Z7982 Long term (current) use of aspirin: Secondary | ICD-10-CM | POA: Diagnosis not present

## 2017-05-25 LAB — COMPREHENSIVE METABOLIC PANEL
ALT: 21 U/L (ref 14–54)
ANION GAP: 12 (ref 5–15)
AST: 29 U/L (ref 15–41)
Albumin: 3.7 g/dL (ref 3.5–5.0)
Alkaline Phosphatase: 65 U/L (ref 38–126)
BILIRUBIN TOTAL: 0.5 mg/dL (ref 0.3–1.2)
BUN: 34 mg/dL — AB (ref 6–20)
CO2: 23 mmol/L (ref 22–32)
Calcium: 8.4 mg/dL — ABNORMAL LOW (ref 8.9–10.3)
Chloride: 97 mmol/L — ABNORMAL LOW (ref 101–111)
Creatinine, Ser: 2.05 mg/dL — ABNORMAL HIGH (ref 0.44–1.00)
GFR calc non Af Amer: 22 mL/min — ABNORMAL LOW (ref >60)
GFR, EST AFRICAN AMERICAN: 25 mL/min — AB (ref >60)
Glucose, Bld: 49 mg/dL — ABNORMAL LOW (ref 65–99)
POTASSIUM: 3.3 mmol/L — AB (ref 3.5–5.1)
Sodium: 132 mmol/L — ABNORMAL LOW (ref 135–145)
TOTAL PROTEIN: 7.8 g/dL (ref 6.5–8.1)

## 2017-05-25 LAB — CBC WITH DIFFERENTIAL/PLATELET
BASOS ABS: 0 10*3/uL (ref 0.0–0.1)
BASOS PCT: 0 %
Basophils Absolute: 0 10*3/uL (ref 0.0–0.1)
Basophils Relative: 0 %
EOS ABS: 0.1 10*3/uL (ref 0.0–0.7)
EOS PCT: 1 %
EOS PCT: 1 %
Eosinophils Absolute: 0.1 10*3/uL (ref 0.0–0.7)
HCT: 34.6 % — ABNORMAL LOW (ref 36.0–46.0)
HCT: 29.9 % — ABNORMAL LOW (ref 36.0–46.0)
HEMOGLOBIN: 9.4 g/dL — AB (ref 12.0–15.0)
Hemoglobin: 10.7 g/dL — ABNORMAL LOW (ref 12.0–15.0)
LYMPHS PCT: 10 %
Lymphocytes Relative: 11 %
Lymphs Abs: 0.8 10*3/uL (ref 0.7–4.0)
Lymphs Abs: 1.1 10*3/uL (ref 0.7–4.0)
MCH: 27.7 pg (ref 26.0–34.0)
MCH: 27.9 pg (ref 26.0–34.0)
MCHC: 30.9 g/dL (ref 30.0–36.0)
MCHC: 31.4 g/dL (ref 30.0–36.0)
MCV: 89.6 fL (ref 78.0–100.0)
MCV: 88.7 fL (ref 78.0–100.0)
MONOS PCT: 10 %
Monocytes Absolute: 0.8 10*3/uL (ref 0.1–1.0)
Monocytes Absolute: 1 10*3/uL (ref 0.1–1.0)
Monocytes Relative: 10 %
NEUTROS ABS: 6.4 10*3/uL (ref 1.7–7.7)
NEUTROS PCT: 78 %
Neutro Abs: 7.7 10*3/uL (ref 1.7–7.7)
Neutrophils Relative %: 79 %
PLATELETS: 152 10*3/uL (ref 150–400)
PLATELETS: 131 10*3/uL — AB (ref 150–400)
RBC: 3.86 MIL/uL — AB (ref 3.87–5.11)
RBC: 3.37 MIL/uL — AB (ref 3.87–5.11)
RDW: 14.4 % (ref 11.5–15.5)
RDW: 14.2 % (ref 11.5–15.5)
WBC: 8.1 10*3/uL (ref 4.0–10.5)
WBC: 9.9 10*3/uL (ref 4.0–10.5)

## 2017-05-25 LAB — CBG MONITORING, ED: GLUCOSE-CAPILLARY: 168 mg/dL — AB (ref 65–99)

## 2017-05-25 LAB — GLUCOSE, CAPILLARY: GLUCOSE-CAPILLARY: 105 mg/dL — AB (ref 65–99)

## 2017-05-25 LAB — INFLUENZA PANEL BY PCR (TYPE A & B)
INFLBPCR: NEGATIVE
Influenza A By PCR: NEGATIVE

## 2017-05-25 LAB — PROTIME-INR
INR: 1.04
PROTHROMBIN TIME: 13.5 s (ref 11.4–15.2)

## 2017-05-25 LAB — LACTIC ACID, PLASMA
LACTIC ACID, VENOUS: 1.2 mmol/L (ref 0.5–1.9)
LACTIC ACID, VENOUS: 0.5 mmol/L (ref 0.5–1.9)

## 2017-05-25 LAB — I-STAT CG4 LACTIC ACID, ED: LACTIC ACID, VENOUS: 0.81 mmol/L (ref 0.5–1.9)

## 2017-05-25 MED ORDER — SODIUM CHLORIDE 0.9 % IV SOLN
500.0000 mg | Freq: Once | INTRAVENOUS | Status: AC
  Administered 2017-05-25: 500 mg via INTRAVENOUS
  Filled 2017-05-25: qty 500

## 2017-05-25 MED ORDER — POTASSIUM CHLORIDE CRYS ER 20 MEQ PO TBCR
40.0000 meq | EXTENDED_RELEASE_TABLET | Freq: Once | ORAL | Status: AC
  Administered 2017-05-25: 40 meq via ORAL
  Filled 2017-05-25: qty 2

## 2017-05-25 MED ORDER — ACETAMINOPHEN 325 MG PO TABS
ORAL_TABLET | ORAL | Status: AC
  Filled 2017-05-25: qty 2

## 2017-05-25 MED ORDER — MEDROXYPROGESTERONE ACETATE 10 MG PO TABS: 10.0000 mg | ORAL_TABLET | Freq: Every day | ORAL | Status: DC

## 2017-05-25 MED ORDER — GABAPENTIN 300 MG PO CAPS
300.0000 mg | ORAL_CAPSULE | Freq: Three times a day (TID) | ORAL | Status: DC
  Administered 2017-05-25 – 2017-05-27 (×5): 300 mg via ORAL
  Filled 2017-05-25 (×5): qty 1

## 2017-05-25 MED ORDER — SIMVASTATIN 20 MG PO TABS
40.0000 mg | ORAL_TABLET | Freq: Every day | ORAL | Status: DC
  Administered 2017-05-25 – 2017-05-26 (×2): 40 mg via ORAL
  Filled 2017-05-25 (×2): qty 2

## 2017-05-25 MED ORDER — ACETAMINOPHEN 325 MG PO TABS
650.0000 mg | ORAL_TABLET | Freq: Once | ORAL | Status: AC
  Administered 2017-05-25: 650 mg via ORAL

## 2017-05-25 MED ORDER — MEDROXYPROGESTERONE ACETATE 10 MG PO TABS: 10.0000 mg | ORAL_TABLET | ORAL | Status: DC

## 2017-05-25 MED ORDER — DEXTROSE 50 % IV SOLN
1.0000 | Freq: Once | INTRAVENOUS | Status: AC
  Administered 2017-05-25: 50 mL via INTRAVENOUS

## 2017-05-25 MED ORDER — ENOXAPARIN SODIUM 30 MG/0.3ML ~~LOC~~ SOLN
30.0000 mg | SUBCUTANEOUS | Status: DC
  Administered 2017-05-25: 30 mg via SUBCUTANEOUS
  Filled 2017-05-25: qty 0.3

## 2017-05-25 MED ORDER — ASPIRIN EC 81 MG PO TBEC
81.0000 mg | DELAYED_RELEASE_TABLET | Freq: Every day | ORAL | Status: DC
  Administered 2017-05-25 – 2017-05-27 (×3): 81 mg via ORAL
  Filled 2017-05-25 (×3): qty 1

## 2017-05-25 MED ORDER — DEXTROSE-NACL 5-0.9 % IV SOLN
INTRAVENOUS | Status: DC
  Administered 2017-05-25 – 2017-05-26 (×2): via INTRAVENOUS

## 2017-05-25 MED ORDER — CEFTRIAXONE SODIUM 1 G IJ SOLR
1.0000 g | INTRAMUSCULAR | Status: DC
  Administered 2017-05-26: 1 g via INTRAVENOUS
  Filled 2017-05-25 (×2): qty 10
  Filled 2017-05-25: qty 1
  Filled 2017-05-25: qty 10

## 2017-05-25 MED ORDER — SODIUM CHLORIDE 0.9 % IV SOLN
1.0000 g | Freq: Once | INTRAVENOUS | Status: AC
  Administered 2017-05-25: 1 g via INTRAVENOUS
  Filled 2017-05-25: qty 10

## 2017-05-25 MED ORDER — INSULIN ASPART 100 UNIT/ML ~~LOC~~ SOLN
0.0000 [IU] | Freq: Three times a day (TID) | SUBCUTANEOUS | Status: DC
  Administered 2017-05-27: 2 [IU] via SUBCUTANEOUS

## 2017-05-25 MED ORDER — LABETALOL HCL 200 MG PO TABS
100.0000 mg | ORAL_TABLET | Freq: Two times a day (BID) | ORAL | Status: DC
  Administered 2017-05-25 – 2017-05-27 (×4): 100 mg via ORAL
  Filled 2017-05-25 (×4): qty 1

## 2017-05-25 MED ORDER — OXYBUTYNIN CHLORIDE 5 MG PO TABS
5.0000 mg | ORAL_TABLET | Freq: Every day | ORAL | Status: DC
  Administered 2017-05-26 – 2017-05-27 (×2): 5 mg via ORAL
  Filled 2017-05-25 (×2): qty 1

## 2017-05-25 MED ORDER — PANTOPRAZOLE SODIUM 40 MG PO TBEC
40.0000 mg | DELAYED_RELEASE_TABLET | Freq: Every day | ORAL | Status: DC
  Administered 2017-05-26 – 2017-05-27 (×2): 40 mg via ORAL
  Filled 2017-05-25 (×2): qty 1

## 2017-05-25 MED ORDER — SODIUM CHLORIDE 0.9 % IV SOLN
500.0000 mg | INTRAVENOUS | Status: DC
  Administered 2017-05-26: 500 mg via INTRAVENOUS
  Filled 2017-05-25 (×4): qty 500

## 2017-05-25 MED ORDER — DEXTROSE 50 % IV SOLN
INTRAVENOUS | Status: AC
  Administered 2017-05-25: 50 mL via INTRAVENOUS
  Filled 2017-05-25: qty 50

## 2017-05-25 MED ORDER — INSULIN ASPART 100 UNIT/ML ~~LOC~~ SOLN: 0.0000 [IU] | Freq: Every day | SUBCUTANEOUS | Status: DC

## 2017-05-25 MED ORDER — THIAMINE HCL 100 MG/ML IJ SOLN
100.0000 mg | Freq: Once | INTRAMUSCULAR | Status: AC
  Administered 2017-05-25: 100 mg via INTRAVENOUS
  Filled 2017-05-25: qty 2

## 2017-05-25 NOTE — Progress Notes (Signed)
Pharmacy Antibiotic Note  Amy Stein is a 84 y.o. female admitted on 05/25/2017 with pneumonia.  Pharmacy has been consulted for rocephin and azithromycin dosing.  Plan: Rocephin 1gm IV q24 hours Azithromycin 500 mg IV q24 hours Pharmacy will sign off as no dose adjustment needed  Height: 5\' 2"  (157.5 cm) Weight: 158 lb (71.7 kg) IBW/kg (Calculated) : 50.1  Temp (24hrs), Avg:99.7 F (37.6 C), Min:98.7 F (37.1 C), Max:100.5 F (38.1 C)  Recent Labs  Lab 05/25/17 1536 05/25/17 1537  WBC 8.1  --   CREATININE 2.05*  --   LATICACIDVEN  --  1.2    Estimated Creatinine Clearance: 19.9 mL/min (A) (by C-G formula based on SCr of 2.05 mg/dL (H)).    Allergies  Allergen Reactions  . Sulfonamide Derivatives Rash    Thank you for allowing pharmacy to be a part of this patient's care.  Beverlee Nims 05/25/2017 5:39 PM

## 2017-05-25 NOTE — ED Provider Notes (Addendum)
[facility] EMERGENCY DEPARTMENT Provider Note   CSN: [id number] Arrival date & time: 05/25/17  1255     History   Chief Complaint Chief Complaint  Patient presents with  . Cough    HPI Amy Stein is a 84 y.o. female.  History is obtained from patient and from patient's grandson.  Patient has had cough for the past 2 weeks.  Her grandson reports that she is been confused over the past several days.  She was seen by Dr. Legrand Rams earlier today who prescribed Augmentin and Zithromax grandson thinks she may have taken 1 dose.  She presents today as she is been more confused at home.  She denies any nausea or vomiting.  No other associated symptoms  HPI  Past Medical History:  Diagnosis Date  . Diabetes mellitus   . Hypertension   . Shingles    on both legs 1/18    Patient Active Problem List   Diagnosis Date Noted  . TEAR MEDIAL MENISCUS 09/04/2008  . KNEE PAIN, RIGHT 07/03/2008  . FRACTURE, ANKLE, LEFT 05/14/2008    Past Surgical History:  Procedure Laterality Date  . COLONOSCOPY    . COLONOSCOPY N/A 02/23/2017   Procedure: COLONOSCOPY;  Surgeon: Daneil Dolin, MD;  [facility]: [facility];  Service: Endoscopy;  Laterality: N/A;  9:30 Am  . POLYPECTOMY  02/23/2017   Procedure: POLYPECTOMY;  Surgeon: Daneil Dolin, MD;  Location: [facility];  Service: Endoscopy;;  cecum    OB History    Gravida Para Term Preterm AB Living   '4 4       4   ' SAB TAB Ectopic Multiple Live Births                   Home Medications    Prior to Admission medications   Medication Sig Start Date End Date Taking? Authorizing Provider  aspirin EC 81 MG tablet Take 81 mg by mouth daily.    [provider]  gabapentin (NEURONTIN) 300 MG capsule Take 300 mg by mouth 3 (three) times daily.     [provider]  glipiZIDE (GLUCOTROL XL) 10 MG 24 hr tablet Take 10 mg by mouth daily with breakfast.    [provider]  hydrochlorothiazide (MICROZIDE) 12.5 MG  capsule Take 12.5 mg by mouth daily.    [provider]  insulin glargine (LANTUS) 100 unit/mL SOPN Inject 20 Units at bedtime into the skin.     [provider]  labetalol (NORMODYNE) 100 MG tablet Take 100 mg by mouth 2 (two) times daily.     [provider]  losartan (COZAAR) 100 MG tablet Take 100 mg by mouth daily.    [provider]  medroxyPROGESTERone (PROVERA) 10 MG tablet Take 10 mg See admin instructions by mouth. Take 10 mg by mouth daily for the first 10 days of the month    [provider]  Na Sulfate-K Sulfate-Mg Sulf (SUPREP BOWEL PREP KIT) 17.5-3.13-1.6 GM/180ML SOLN Take 1 kit by mouth as directed. 01/19/17   Mahala Menghini, PA-C  omeprazole (PRILOSEC) 20 MG capsule Take 20 mg by mouth daily.    [provider]  oxybutynin (DITROPAN) 5 MG tablet Take 5 mg by mouth daily.    [provider]  simvastatin (ZOCOR) 40 MG tablet Take 40 mg by mouth at bedtime.     [provider]  sitaGLIPtin (JANUVIA) 100 MG tablet Take 100 mg by mouth daily.  [provider]    Family History Family History  Problem Relation Age of Onset  . Throat cancer Father   . Pneumonia Mother   . Pancreatic cancer Brother     Social History Social History   Tobacco Use  . Smoking status: Former Research scientist (life sciences)  . Smokeless tobacco: Never Used  Substance Use Topics  . Alcohol use: No  . Drug use: No     Allergies   Sulfonamide derivatives   Review of Systems Review of Systems  Respiratory: Positive for cough.   Allergic/Immunologic: Positive for immunocompromised state.  All other systems reviewed and are negative.    Physical Exam Updated Vital Signs BP (!) 163/86   Pulse 86   Temp 98.7 F (37.1 C) (Oral)   Resp (!) 22   Ht '5\' 2"'  (1.575 m)   Wt 71.7 kg (158 lb)   SpO2 95%   BMI 28.90 kg/m   Physical Exam  Constitutional: She appears well-developed and well-nourished.  HENT:  Head: Normocephalic  and atraumatic.  Eyes: Conjunctivae are normal. Pupils are equal, round, and reactive to light.  Neck: Neck supple. No tracheal deviation present. No thyromegaly present.  Cardiovascular: Normal rate and regular rhythm.  No murmur heard. Pulmonary/Chest: Effort normal.  Diffuse rhonchi  Abdominal: Soft. Bowel sounds are normal. She exhibits no distension. There is no tenderness.  Musculoskeletal: Normal range of motion. She exhibits no edema or tenderness.  Neurological: She is alert. Coordination normal.  Skin: Skin is warm and dry. No rash noted.  Psychiatric: She has a normal mood and affect.  Nursing note and vitals reviewed.    ED Treatments / Results  Labs (all labs ordered are listed, but only abnormal results are displayed) Labs Reviewed  CBC WITH DIFFERENTIAL/PLATELET - Abnormal; Notable for the following components:      Result Value   RBC 3.86 (*)    Hemoglobin 10.7 (*)    HCT 34.6 (*)    All other components within normal limits  CULTURE, BLOOD (ROUTINE X 2)  CULTURE, BLOOD (ROUTINE X 2)  COMPREHENSIVE METABOLIC PANEL  PROTIME-INR  URINALYSIS, ROUTINE W REFLEX MICROSCOPIC  LACTIC ACID, PLASMA  LACTIC ACID, PLASMA  INFLUENZA PANEL BY PCR (TYPE A & B)  CBC WITH DIFFERENTIAL/PLATELET  I-STAT CG4 LACTIC ACID, ED    EKG  EKG Interpretation  Date/Time:  Wednesday May 25 2017 18:26:10 EST Ventricular Rate:  80 PR Interval:    QRS Duration: 83 QT Interval:  431 QTC Calculation: 472 R Axis:   40 Text Interpretation:  Sinus rhythm Atrial premature complexes Abnormal R-wave progression, early transition Borderline T abnormalities, anterior leads Confirmed by Orlie Dakin [phone]) on 05/25/2017 6:31:17 PM       Radiology Dg Chest 2 View  Result Date: 05/25/2017 CLINICAL DATA:  Cough and mid chest pain since yesterday. Former smoker. EXAM: CHEST  2 VIEW COMPARISON:  05/13/2016 FINDINGS: Low lung volumes with increased interstitial lung markings bilaterally  which may reflect acute bronchitic change. Superimposed right upper lobe airspace opacities consistent with pneumonia. Eventration of the left hemidiaphragm is redemonstrated. Heart is borderline enlarged with aortic atherosclerosis. Osteoarthritic degenerative changes are noted about both glenohumeral joints. No effusion or pneumothorax. IMPRESSION: Right upper lobe pneumonia superimposed on bronchitic change. Followup PA and lateral chest X-ray is recommended in 3-4 weeks following trial of antibiotic therapy to ensure resolution and exclude the unlikelihood of an underlying malignancy. Electronically Signed   By: Ashley Royalty M.D.   On: 05/25/2017 01:22  Procedures Procedures (including critical care time)  Medications Ordered in ED Medications  cefTRIAXone (ROCEPHIN) 1 g in sodium chloride 0.9 % 100 mL IVPB (not administered)  azithromycin (ZITHROMAX) 500 mg in sodium chloride 0.9 % 250 mL IVPB (not administered)  Chest x-ray viewed by me  Results for orders placed or performed during the hospital encounter of 05/25/17  Culture, blood (Routine x 2)  Result Value Ref Range   Specimen Description LEFT ANTECUBITAL    Special Requests      BOTTLES DRAWN AEROBIC AND ANAEROBIC Blood Culture results may not be optimal due to an excessive volume of blood received in culture bottles Performed at [facility], [address], [place], [place] [address]    Culture PENDING    Report Status PENDING   Culture, blood (Routine x 2)  Result Value Ref Range   Specimen Description RIGHT ANTECUBITAL    Special Requests      BOTTLES DRAWN AEROBIC AND ANAEROBIC Blood Culture results may not be optimal due to an excessive volume of blood received in culture bottles Performed at [facility], [address], [place], [place] [address]    Culture PENDING    Report Status PENDING   Comprehensive metabolic panel  Result Value Ref Range   Sodium 132 (L) 135 - 145 mmol/L   Potassium 3.3 (L) 3.5 - 5.1 mmol/L     Chloride 97 (L) 101 - 111 mmol/L   CO2 23 22 - 32 mmol/L   Glucose, Bld 49 (L) 65 - 99 mg/dL   BUN 34 (H) 6 - 20 mg/dL   Creatinine, Ser 2.05 (H) 0.44 - 1.00 mg/dL   Calcium 8.4 (L) 8.9 - 10.3 mg/dL   Total Protein 7.8 6.5 - 8.1 g/dL   Albumin 3.7 3.5 - 5.0 g/dL   AST 29 15 - 41 U/L   ALT 21 14 - 54 U/L   Alkaline Phosphatase 65 38 - 126 U/L   Total Bilirubin 0.5 0.3 - 1.2 mg/dL   GFR calc non Af Amer 22 (L) >60 mL/min   GFR calc Af Amer 25 (L) >60 mL/min   Anion gap 12 5 - 15  CBC with Differential  Result Value Ref Range   WBC 8.1 4.0 - 10.5 K/uL   RBC 3.86 (L) 3.87 - 5.11 MIL/uL   Hemoglobin 10.7 (L) 12.0 - 15.0 g/dL   HCT 34.6 (L) 36.0 - 46.0 %   MCV 89.6 78.0 - 100.0 fL   MCH 27.7 26.0 - 34.0 pg   MCHC 30.9 30.0 - 36.0 g/dL   RDW 14.4 11.5 - 15.5 %   Platelets 152 150 - 400 K/uL   Neutrophils Relative % 79 %   Neutro Abs 6.4 1.7 - 7.7 K/uL   Lymphocytes Relative 10 %   Lymphs Abs 0.8 0.7 - 4.0 K/uL   Monocytes Relative 10 %   Monocytes Absolute 0.8 0.1 - 1.0 K/uL   Eosinophils Relative 1 %   Eosinophils Absolute 0.1 0.0 - 0.7 K/uL   Basophils Relative 0 %   Basophils Absolute 0.0 0.0 - 0.1 K/uL  Protime-INR  Result Value Ref Range   Prothrombin Time 13.5 11.4 - 15.2 seconds   INR 1.04   Lactic acid, plasma  Result Value Ref Range   Lactic Acid, Venous 1.2 0.5 - 1.9 mmol/L  Influenza panel by PCR (type A & B)  Result Value Ref Range   Influenza A By PCR NEGATIVE NEGATIVE   Influenza B By PCR NEGATIVE  NEGATIVE  I-Stat CG4 Lactic Acid, ED  (not at  [facility])  Result Value Ref Range   Lactic Acid, Venous 0.81 0.5 - 1.9 mmol/L   Dg Chest 2 View  Result Date: 05/25/2017 CLINICAL DATA:  Cough and mid chest pain since yesterday. Former smoker. EXAM: CHEST  2 VIEW COMPARISON:  05/13/2016 FINDINGS: Low lung volumes with increased interstitial lung markings bilaterally which may reflect acute bronchitic change. Superimposed right upper lobe airspace opacities consistent  with pneumonia. Eventration of the left hemidiaphragm is redemonstrated. Heart is borderline enlarged with aortic atherosclerosis. Osteoarthritic degenerative changes are noted about both glenohumeral joints. No effusion or pneumothorax. IMPRESSION: Right upper lobe pneumonia superimposed on bronchitic change. Followup PA and lateral chest X-ray is recommended in 3-4 weeks following trial of antibiotic therapy to ensure resolution and exclude the unlikelihood of an underlying malignancy. Electronically Signed   By: Ashley Royalty M.D.   On: 05/25/2017 01:22   Code sepsis called based on sirs criteria  temperature, respiratory rate, source of infection felt to be community-acquired pneumonia Initial Impression / Assessment and Plan / ED Course  I have reviewed the triage vital signs and the nursing notes.  Pertinent labs & imaging results that were available during my care of the patient were reviewed by me and considered in my medical decision making (see chart for details).     5:55 PM patient noted to be somnolent.  Arousable to gentle tactile stimulus.  Noted to be hypoglycemic.  D50 IV ordered as well as thiamine 100 mg's IV.. At 6:15 PM is alert awake Glasgow Coma Score 15. Patient be treated with intravenous antibiotics as well as supplemental oral potassium.  I consulted Dr. Maudie Mercury who will arrange for overnight stay.  Lab work consistent with acute on chronic renal insufficiency.  Anemia is chronic Final Clinical Impressions(s) / ED Diagnoses  Diagnoses #1 community-acquired pneumonia of the right upper lobe the lung #2 hypoglycemia #3 hypokalemia #4 acute on chronic renal insufficiency Final diagnoses:  None  #5 anemia  ED Discharge Orders    None       Orlie Dakin, MD 05/25/17 [place]    Orlie Dakin, MD 05/25/17 2333

## 2017-05-25 NOTE — ED Notes (Signed)
Add Emergency Contact:   Pt's Daughter Shantelle Alles [phone]

## 2017-05-25 NOTE — ED Notes (Signed)
[name] states that his grandmother is going in and out.  States she is not responding well.  Pt pulled back into triage.  Ambulatory without difficulty.  Pt no distress in triage.  Alert and oriented.

## 2017-05-25 NOTE — ED Notes (Signed)
Per EDP to administer 1 amp D50 and then re-check pt cbg.

## 2017-05-25 NOTE — ED Notes (Signed)
Pt c/o the wait time and asking if she could get a copy of her xray and she would follow up with her PCP later today; Fritz Pickerel with radiology burned CD and given to pt to follow up; pt seen leaving ED

## 2017-05-25 NOTE — ED Triage Notes (Signed)
Pt c/o chest pain with cough for the last couple of days; pt c/o some feeling of weakness;

## 2017-05-25 NOTE — ED Triage Notes (Signed)
Patient reports of non-productive cough x 2 weeks. Denies fevers.

## 2017-05-25 NOTE — ED Notes (Signed)
EDP at the bedside.  ?

## 2017-05-25 NOTE — ED Notes (Signed)
Add Len Childs, Cousin of Pt, to Emergency Contact: [phone]

## 2017-05-25 NOTE — H&P (Signed)
[facility] H&P   Patient Demographics:    Amy Stein, is a 84 y.o. female  MRN: [id number]   DOB - 1933-11-12  Admit Date - 05/25/2017  Outpatient Primary MD for the patient is Rosita Fire, MD  Referring MD/NP/PA: Rod Holler  Outpatient Specialists:    Patient coming from: home  Chief Complaint  Patient presents with  . Cough      HPI:    Amy Stein  is a 84 y.o. female, w Hypertension, Dm2 presents due to cough for the past 2 weeks.  Denies fever, chills, cp, palp, sob.  + hoarsenss, and then today some confusion (sugar 45)  In ED,  CXR IMPRESSION: Right upper lobe pneumonia superimposed on bronchitic change. Followup PA and lateral chest X-ray is recommended in 3-4 weeks following trial of antibiotic therapy to ensure resolution and exclude the unlikelihood of an underlying malignancy.  Wbc 8.1, Hgb 10.7, Plt 152 Na 132, K 3.3, Bun 34, Creatinine 2.05 INR 1.04 La 1.2  Pt will be admitted for right upper pneumonia, hyponatremia, hypokalemia, ARF, hypoglycemia    Review of systems:    In addition to the HPI above,    No Fever-chills, No Headache, No changes with Vision or hearing, No problems swallowing food or Liquids, No Chest pain, No Shortness of Breath, No Abdominal pain, No Nausea or Vommitting, Bowel movements are regular, No Blood in stool or Urine, No dysuria, No new skin rashes or bruises, No new joints pains-aches,  No new weakness, tingling, numbness in any extremity, No recent weight gain or loss, No polyuria, polydypsia or polyphagia, No significant Mental Stressors.  A full 10 point Review of Systems was done, except as stated above, all other Review of Systems were negative.   With Past History of the following :    Past Medical History:  Diagnosis Date  . Diabetes mellitus   . Hypertension   . Shingles    on both legs 1/18       Past Surgical History:  Procedure Laterality Date  . COLONOSCOPY    . COLONOSCOPY N/A 02/23/2017   Procedure: COLONOSCOPY;  Surgeon: Daneil Dolin, MD;  [facility];  Service: Endoscopy;  Laterality: N/A;  9:30 Am  . POLYPECTOMY  02/23/2017   Procedure: POLYPECTOMY;  Surgeon: Daneil Dolin, MD;  [facility]: [facility];  Service: Endoscopy;;  cecum      Social History:     Social History   Tobacco Use  . Smoking status: Former Research scientist (life sciences)  . Smokeless tobacco: Never Used  Substance Use Topics  . Alcohol use: No     Lives - at home  Mobility - walks by self   Family History :     Family History  Problem Relation Age of Onset  . Throat cancer Father   . Pneumonia Mother   . Pancreatic cancer Brother  Home Medications:   Prior to Admission medications   Medication Sig Start Date End Date Taking? Authorizing Provider  aspirin EC 81 MG tablet Take 81 mg by mouth daily.   Yes [provider]  insulin glargine (LANTUS) 100 unit/mL SOPN Inject 20 Units at bedtime into the skin.    Yes [provider]  medroxyPROGESTERone (PROVERA) 10 MG tablet Take 10 mg See admin instructions by mouth. Take 10 mg by mouth daily for the first 10 days of the month   Yes [provider]  gabapentin (NEURONTIN) 300 MG capsule Take 300 mg by mouth 3 (three) times daily.     [provider]  glipiZIDE (GLUCOTROL XL) 10 MG 24 hr tablet Take 10 mg by mouth daily with breakfast.    [provider]  hydrochlorothiazide (MICROZIDE) 12.5 MG capsule Take 12.5 mg by mouth daily.    [provider]  labetalol (NORMODYNE) 100 MG tablet Take 100 mg by mouth 2 (two) times daily.     [provider]  losartan (COZAAR) 100 MG tablet Take 100 mg by mouth daily.    [provider]  Na Sulfate-K Sulfate-Mg Sulf (SUPREP BOWEL PREP KIT) 17.5-3.13-1.6 GM/180ML SOLN Take 1 kit by mouth as directed. Patient not taking: Reported  on 05/25/2017 01/19/17   Mahala Menghini, PA-C  omeprazole (PRILOSEC) 20 MG capsule Take 20 mg by mouth daily.    [provider]  oxybutynin (DITROPAN) 5 MG tablet Take 5 mg by mouth daily.    [provider]  simvastatin (ZOCOR) 40 MG tablet Take 40 mg by mouth at bedtime.     [provider]  sitaGLIPtin (JANUVIA) 100 MG tablet Take 100 mg by mouth daily.    [provider]     Allergies:     Allergies  Allergen Reactions  . Sulfonamide Derivatives Rash     Physical Exam:   Vitals  Blood pressure 103/66, pulse 85, temperature (!) 100.5 F (38.1 C), temperature source Oral, resp. rate (!) 22, height '5\' 2"'  (1.575 m), weight 71.7 kg (158 lb), SpO2 98 %.   1. General  lying in bed in NAD,   2. Normal affect and insight, Not Suicidal or Homicidal, Awake Alert, Oriented X 3.  3. No F.N deficits, ALL C.Nerves Intact, Strength 5/5 all 4 extremities, Sensation intact all 4 extremities, Plantars down going.  4. Ears and Eyes appear Normal, Conjunctivae clear, PERRLA. Moist Oral Mucosa.  5. Supple Neck, No JVD, No cervical lymphadenopathy appriciated, No Carotid Bruits.  6. Symmetrical Chest wall movement, Good air movement bilaterally, slight crackles right upper lung, no wheeze  7. RRR, No Gallops, Rubs or Murmurs, No Parasternal Heave.  8. Positive Bowel Sounds, Abdomen Soft, No tenderness, No organomegaly appriciated,No rebound -guarding or rigidity.  9.  No Cyanosis, Normal Skin Turgor, No Skin Rash or Bruise.  10. Good muscle tone,  joints appear normal , no effusions, Normal ROM.  11. No Palpable Lymph Nodes in Neck or Axillae     Data Review:    CBC Recent Labs  Lab 05/25/17 1536  WBC 8.1  HGB 10.7*  HCT 34.6*  PLT 152  MCV 89.6  MCH 27.7  MCHC 30.9  RDW 14.4  LYMPHSABS 0.8  MONOABS 0.8  EOSABS 0.1  BASOSABS 0.0    ------------------------------------------------------------------------------------------------------------------  Chemistries  Recent Labs  Lab 05/25/17 1536  NA 132*  K 3.3*  CL 97*  CO2 23  GLUCOSE 49*  BUN 34*  CREATININE 2.05*  CALCIUM 8.4*  AST 29  ALT 21  ALKPHOS 65  BILITOT 0.5   ------------------------------------------------------------------------------------------------------------------ estimated creatinine clearance is 19.9 mL/min (A) (by C-G formula based on SCr of 2.05 mg/dL (H)). ------------------------------------------------------------------------------------------------------------------ No results for input(s): TSH, T4TOTAL, T3FREE, THYROIDAB in the last 72 hours.  Invalid input(s): FREET3  Coagulation profile Recent Labs  Lab 05/25/17 1536  INR 1.04   ------------------------------------------------------------------------------------------------------------------- No results for input(s): DDIMER in the last 72 hours. -------------------------------------------------------------------------------------------------------------------  Cardiac Enzymes No results for input(s): CKMB, TROPONINI, MYOGLOBIN in the last 168 hours.  Invalid input(s): CK ------------------------------------------------------------------------------------------------------------------ No results found for: BNP   ---------------------------------------------------------------------------------------------------------------  Urinalysis    Component Value Date/Time   COLORURINE YELLOW 08/29/2016 1938   APPEARANCEUR CLEAR 08/29/2016 1938   LABSPEC 1.011 08/29/2016 1938   PHURINE 6.0 08/29/2016 1938   GLUCOSEU >=500 (A) 08/29/2016 1938   HGBUR SMALL (A) 08/29/2016 1938   BILIRUBINUR NEGATIVE 08/29/2016 1938   KETONESUR NEGATIVE 08/29/2016 1938   PROTEINUR NEGATIVE 08/29/2016 1938   UROBILINOGEN 0.2 11/01/2009 0257   NITRITE NEGATIVE 08/29/2016 1938    LEUKOCYTESUR NEGATIVE 08/29/2016 1938    ----------------------------------------------------------------------------------------------------------------   Imaging Results:    Dg Chest 2 View  Result Date: 05/25/2017 CLINICAL DATA:  Cough and mid chest pain since yesterday. Former smoker. EXAM: CHEST  2 VIEW COMPARISON:  05/13/2016 FINDINGS: Low lung volumes with increased interstitial lung markings bilaterally which may reflect acute bronchitic change. Superimposed right upper lobe airspace opacities consistent with pneumonia. Eventration of the left hemidiaphragm is redemonstrated. Heart is borderline enlarged with aortic atherosclerosis. Osteoarthritic degenerative changes are noted about both glenohumeral joints. No effusion or pneumothorax. IMPRESSION: Right upper lobe pneumonia superimposed on bronchitic change. Followup PA and lateral chest X-ray is recommended in 3-4 weeks following trial of antibiotic therapy to ensure resolution and exclude the unlikelihood of an underlying malignancy. Electronically Signed   By: Ashley Royalty M.D.   On: 05/25/2017 01:22       Assessment & Plan:    Principal Problem:   Pneumonia Active Problems:   Hypokalemia   ARF (acute renal failure) (HCC)   Hypoglycemia   Anemia    Cap Blood culture x2 Urine strep antigen, Urine legionella antigen Check influenza, check resp viral panel Rocephin 1gm iv qday, Zithromax 546m iv qday  ARF Check urine sodium, urine eosinophils, urine creatinine Renal ultrasound Hydrate with ns iv  Check cmp in am  Hypokalemia Replete Check cmp  Hyponatremia Check serum osm, cortisol, tsh Check urine sodium, urine osm ,  Hydrate with ns iv Check cmp in am  Hypoglycemia/ DM2 HOLD Lantus HOLD Glucotrol XL fsbs ac and qhs, ISS  Anemia Check cbc in am    DVT Prophylaxis Lovenox - SCDs  AM Labs Ordered, also please review Full Orders  Family Communication: Admission, patients condition and plan of care  including tests being ordered have been discussed with the patient  who indicate understanding and agree with the plan and Code Status.  Code Status FULL CODE  Likely DC to  home  Condition GUARDED   Consults called: none  Admission status: inpatient   Time spent in minutes : 45   JJani GravelM.D on 05/25/2017 at 6:02 PM  Between 7am to 7pm - Pager - [phone]  After 7pm go to [email or web address] - password [email or web address] - Office  [phone]

## 2017-05-26 DIAGNOSIS — N390 Urinary tract infection, site not specified: Secondary | ICD-10-CM

## 2017-05-26 LAB — BASIC METABOLIC PANEL
ANION GAP: 10 (ref 5–15)
BUN: 23 mg/dL — ABNORMAL HIGH (ref 6–20)
CALCIUM: 8.3 mg/dL — AB (ref 8.9–10.3)
CO2: 24 mmol/L (ref 22–32)
CREATININE: 1.22 mg/dL — AB (ref 0.44–1.00)
Chloride: 105 mmol/L (ref 101–111)
GFR, EST AFRICAN AMERICAN: 47 mL/min — AB (ref >60)
GFR, EST NON AFRICAN AMERICAN: 40 mL/min — AB (ref >60)
Glucose, Bld: 99 mg/dL (ref 65–99)
Potassium: 3.9 mmol/L (ref 3.5–5.1)
Sodium: 139 mmol/L (ref 135–145)

## 2017-05-26 LAB — URINALYSIS, ROUTINE W REFLEX MICROSCOPIC
Bilirubin Urine: NEGATIVE
GLUCOSE, UA: NEGATIVE mg/dL
KETONES UR: NEGATIVE mg/dL
Nitrite: NEGATIVE
PROTEIN: NEGATIVE mg/dL
Specific Gravity, Urine: 1.004 — ABNORMAL LOW (ref 1.005–1.030)
pH: 6 (ref 5.0–8.0)

## 2017-05-26 LAB — GLUCOSE, CAPILLARY
GLUCOSE-CAPILLARY: 138 mg/dL — AB (ref 65–99)
GLUCOSE-CAPILLARY: 113 mg/dL — AB (ref 65–99)
GLUCOSE-CAPILLARY: 78 mg/dL (ref 65–99)
GLUCOSE-CAPILLARY: 165 mg/dL — AB (ref 65–99)
GLUCOSE-CAPILLARY: 32 mg/dL — AB (ref 65–99)

## 2017-05-26 LAB — EXPECTORATED SPUTUM ASSESSMENT W GRAM STAIN, RFLX TO RESP C: Special Requests: NORMAL

## 2017-05-26 LAB — VITAMIN B12: Vitamin B-12: 201 pg/mL (ref 180–914)

## 2017-05-26 LAB — SODIUM, URINE, RANDOM: Sodium, Ur: 66 mmol/L

## 2017-05-26 LAB — EXPECTORATED SPUTUM ASSESSMENT W REFEX TO RESP CULTURE

## 2017-05-26 LAB — CREATININE, URINE, RANDOM: Creatinine, Urine: 44.12 mg/dL

## 2017-05-26 LAB — STREP PNEUMONIAE URINARY ANTIGEN: STREP PNEUMO URINARY ANTIGEN: NEGATIVE

## 2017-05-26 LAB — HEMOGLOBIN A1C
Hgb A1c MFr Bld: 7.6 % — ABNORMAL HIGH (ref 4.8–5.6)
Mean Plasma Glucose: 171.42 mg/dL

## 2017-05-26 LAB — IRON AND TIBC
Iron: 13 ug/dL — ABNORMAL LOW (ref 28–170)
Saturation Ratios: 5 % — ABNORMAL LOW (ref 10.4–31.8)
TIBC: 246 ug/dL — AB (ref 250–450)
UIBC: 233 ug/dL

## 2017-05-26 LAB — TSH: TSH: 0.793 u[IU]/mL (ref 0.350–4.500)

## 2017-05-26 MED ORDER — DEXTROSE 50 % IV SOLN
1.0000 | Freq: Once | INTRAVENOUS | Status: AC
  Administered 2017-05-26: 50 mL via INTRAVENOUS

## 2017-05-26 MED ORDER — ALBUTEROL SULFATE (2.5 MG/3ML) 0.083% IN NEBU
2.5000 mg | INHALATION_SOLUTION | RESPIRATORY_TRACT | Status: DC | PRN
  Administered 2017-05-26: 2.5 mg via RESPIRATORY_TRACT
  Filled 2017-05-26: qty 3

## 2017-05-26 MED ORDER — DEXTROSE-NACL 5-0.9 % IV SOLN
INTRAVENOUS | Status: AC
  Administered 2017-05-27: 06:00:00 via INTRAVENOUS

## 2017-05-26 MED ORDER — DEXTROSE 50 % IV SOLN
INTRAVENOUS | Status: AC
  Filled 2017-05-26: qty 50

## 2017-05-26 MED ORDER — HYDRALAZINE HCL 20 MG/ML IJ SOLN
10.0000 mg | INTRAMUSCULAR | Status: DC | PRN
  Administered 2017-05-26: 10 mg via INTRAVENOUS
  Filled 2017-05-26: qty 1

## 2017-05-26 MED ORDER — ENOXAPARIN SODIUM 40 MG/0.4ML ~~LOC~~ SOLN
40.0000 mg | SUBCUTANEOUS | Status: DC
  Administered 2017-05-26: 40 mg via SUBCUTANEOUS
  Filled 2017-05-26: qty 0.4

## 2017-05-26 NOTE — Progress Notes (Signed)
Pt's BP remains elevated following labetalol dose, MD made aware. Orders placed and followed.

## 2017-05-26 NOTE — Progress Notes (Signed)
Pt's CBG 32, D50 provided, MD made aware. Pt also exhibiting wheezing and crackles with shortness of breath. MD made aware.

## 2017-05-26 NOTE — Progress Notes (Signed)
Pt's CBG corrected, now 165. Pt A&O through hypoglycemic episode and remains A&O. BP elevated, scheduled BP medicine provided, will recheck in an hour. Continuous D5 NS fluid order set to expire at 2200, MD made aware. Fluids reordered. Will closely monitor CBG.

## 2017-05-26 NOTE — Progress Notes (Signed)
PROGRESS NOTE                                                                                                                                                                                                             Patient Demographics:    Amy Stein, is a 84 y.o. female, DOB - [birth year], YBO:[id number]  Admit date - 05/25/2017   Admitting Physician Jani Gravel, MD  Outpatient Primary MD for the patient is Rosita Fire, MD  LOS - 1  Outpatient Specialists: None  Chief Complaint  Patient presents with  . Cough       Brief Narrative   84 year old female with type 2 diabetes mellitus, hypertension presented with 2 weeks history of cough with sore throat and some shortness of breath without any fevers, chills. As per her grandson she has been confused over the past several days.  She saw her PCP on the day of admission who prescribed her Augmentin and Zithromax but was brought to the ED as she was found to be more confused.  Reportedly also had CBG 40s. Workup in the ED showed right upper lobe pneumonia.     Subjective:   Patient seems oriented to me on my exam.  Complains of some cough but reports of breathing to be better.  Had fever of 100.5 F overnight.   Assessment  & Plan :    Principal Problem: Lobar pneumonia HCC) Continue empiric Rocephin and azithromycin.  Flu PCR negative.  Follow urine for strep and Legionella antigen.  Follow blood cultures.  Supportive care with Tylenol and antitussives. Follow-up chest x-ray in about 4 weeks to ensure resolution.  Active Problems:   Hypokalemia Replenished.  HCTZ.  Acute  kidney injury ([place]) Possibly due to?  Dehydration and UTI.  Monitor with IV fluids.  Follow urine culture.  Check labs this morning.  Hold both HCTZ and losartan.  Check urine lites.    Hypoglycemia She is on Lantus at bedtime and Neurontin 3 times a day.  Hold Lantus and monitor on sliding  scale coverage.  Currently getting D5 with normal saline.  Check A1c.  Monitor CBC closely.  UTI Denies any symptoms.  Check urine culture.  Acute metabolic encephalopathy Possibly due to pneumonia, UTI and hypoglycemia.  Mental status appears to be improving.   Essential hypertension Hold HCTZ and losartan.  Continue labetalol.  As needed IV hydralazine.   Code Status : Full code  Family Communication  : None at bedside  Disposition Plan  : Home once improved  Barriers For Discharge : Active symptoms  Consults  : None  Procedures  : None  DVT Prophylaxis  :  Lovenox -  Lab Results  Component Value Date   PLT 131 (L) 05/25/2017    Antibiotics  :    Anti-infectives (From admission, onward)   Start     Dose/Rate Route Frequency Ordered Stop   05/26/17 1700  cefTRIAXone (ROCEPHIN) 1 g in sodium chloride 0.9 % 100 mL IVPB     1 g 200 mL/hr over 30 Minutes Intravenous Every 24 hours 05/25/17 1741     05/26/17 1700  azithromycin (ZITHROMAX) 500 mg in sodium chloride 0.9 % 250 mL IVPB     500 mg 250 mL/hr over 60 Minutes Intravenous Every 24 hours 05/25/17 1742     05/25/17 1715  cefTRIAXone (ROCEPHIN) 1 g in sodium chloride 0.9 % 100 mL IVPB     1 g 200 mL/hr over 30 Minutes Intravenous  Once 05/25/17 1707 05/25/17 1801   05/25/17 1715  azithromycin (ZITHROMAX) 500 mg in sodium chloride 0.9 % 250 mL IVPB     500 mg 250 mL/hr over 60 Minutes Intravenous  Once 05/25/17 1707 05/25/17 1929        Objective:   Vitals:   05/25/17 1900 05/25/17 2020 05/25/17 2150 05/26/17 0539  BP: 139/68 119/76 (!) 157/80 127/74  Pulse: 72 90 74 79  Resp: (!) 23 20 20 20   Temp:   98.4 F (36.9 C) 98.5 F (36.9 C)  TempSrc:   Oral Oral  SpO2: 96% 96% 99% 100%  Weight:   74.1 kg (163 lb 5.8 oz)   Height:        Wt Readings from Last 3 Encounters:  05/25/17 74.1 kg (163 lb 5.8 oz)  02/23/17 68 kg (150 lb)  08/29/16 70.3 kg (155 lb)     Intake/Output Summary (Last 24 hours)  at 05/26/2017 0959 Last data filed at 05/26/2017 0540 Gross per 24 hour  Intake 593.75 ml  Output 900 ml  Net -306.25 ml     Physical Exam  Gen: not in distress HEENT: Pallor present, moist mucosa, supple neck Chest: Right-sided coarse crackles, no rhonchi or wheeze CVS: N S1&S2, no murmurs,  GI: soft, NT, ND, BS+ Musculoskeletal: warm, no edema CNS: Alert and oriented x2-3 1 nonfocal    Data Review:    CBC Recent Labs  Lab 05/25/17 1536 05/25/17 1754  WBC 8.1 9.9  HGB 10.7* 9.4*  HCT 34.6* 29.9*  PLT 152 131*  MCV 89.6 88.7  MCH 27.7 27.9  MCHC 30.9 31.4  RDW 14.4 14.2  LYMPHSABS 0.8 1.1  MONOABS 0.8 1.0  EOSABS 0.1 0.1  BASOSABS 0.0 0.0    Chemistries  Recent Labs  Lab 05/25/17 1536  NA 132*  K 3.3*  CL 97*  CO2 23  GLUCOSE 49*  BUN 34*  CREATININE 2.05*  CALCIUM 8.4*  AST 29  ALT 21  ALKPHOS 65  BILITOT 0.5   ------------------------------------------------------------------------------------------------------------------ No results for input(s): CHOL, HDL, LDLCALC, TRIG, CHOLHDL, LDLDIRECT in the last 72 hours.  No results found for: HGBA1C ------------------------------------------------------------------------------------------------------------------ No results for input(s): TSH, T4TOTAL, T3FREE, THYROIDAB in the last 72 hours.  Invalid input(s): FREET3 ------------------------------------------------------------------------------------------------------------------ No results for input(s): VITAMINB12, FOLATE, FERRITIN, TIBC, IRON, RETICCTPCT in the last 72 hours.  Coagulation profile Recent Labs  Lab 05/25/17 1536  INR 1.04    No results for input(s): DDIMER in the last 72 hours.  Cardiac Enzymes No results for input(s): CKMB, TROPONINI, MYOGLOBIN in the last 168 hours.  Invalid input(s): CK ------------------------------------------------------------------------------------------------------------------ No results found for:  BNP  Inpatient Medications  Scheduled Meds: . aspirin EC  81 mg Oral Daily  . enoxaparin (LOVENOX) injection  30 mg Subcutaneous Q24H  . gabapentin  300 mg Oral TID  . insulin aspart  0-5 Units Subcutaneous QHS  . insulin aspart  0-9 Units Subcutaneous TID WC  . labetalol  100 mg Oral BID  . [START ON 06/10/2017] medroxyPROGESTERone  10 mg Oral Daily  . oxybutynin  5 mg Oral Daily  . pantoprazole  40 mg Oral Daily  . simvastatin  40 mg Oral QHS   Continuous Infusions: . azithromycin    . cefTRIAXone (ROCEPHIN)  IV    . dextrose 5 % and 0.9% NaCl 75 mL/hr at 05/25/17 2225   PRN Meds:.  Micro Results Recent Results (from the past 240 hour(s))  Culture, blood (Routine x 2)     Status: None (Preliminary result)   Collection Time: 05/25/17  3:36 PM  Result Value Ref Range Status   Specimen Description LEFT ANTECUBITAL  Final   Special Requests   Final    BOTTLES DRAWN AEROBIC AND ANAEROBIC Blood Culture results may not be optimal due to an excessive volume of blood received in culture bottles   Culture   Final    NO GROWTH < 24 HOURS Performed at [facility], [address] [place], [place] [address]    Report Status PENDING  Incomplete  Culture, blood (Routine x 2)     Status: None (Preliminary result)   Collection Time: 05/25/17  3:37 PM  Result Value Ref Range Status   Specimen Description RIGHT ANTECUBITAL  Final   Special Requests   Final    BOTTLES DRAWN AEROBIC AND ANAEROBIC Blood Culture results may not be optimal due to an excessive volume of blood received in culture bottles   Culture   Final    NO GROWTH < 24 HOURS Performed at [facility], [address], [place], [place] [address]    Report Status PENDING  Incomplete    Radiology Reports Dg Chest 2 View  Result Date: 05/25/2017 CLINICAL DATA:  Cough and mid chest pain since yesterday. Former smoker. EXAM: CHEST  2 VIEW COMPARISON:  05/13/2016 FINDINGS: Low lung volumes with increased interstitial lung  markings bilaterally which may reflect acute bronchitic change. Superimposed right upper lobe airspace opacities consistent with pneumonia. Eventration of the left hemidiaphragm is redemonstrated. Heart is borderline enlarged with aortic atherosclerosis. Osteoarthritic degenerative changes are noted about both glenohumeral joints. No effusion or pneumothorax. IMPRESSION: Right upper lobe pneumonia superimposed on bronchitic change. Followup PA and lateral chest X-ray is recommended in 3-4 weeks following trial of antibiotic therapy to ensure resolution and exclude the unlikelihood of an underlying malignancy. Electronically Signed   By: Ashley Royalty M.D.   On: 05/25/2017 01:22    Time Spent in minutes  25   Lucian Baswell M.D on 05/26/2017 at 9:59 AM  Between 7am to 7pm - Pager - [phone]  After 7pm go to [email or web address] - password [facility]  [facility] -  Office  [phone]

## 2017-05-27 DIAGNOSIS — D519 Vitamin B12 deficiency anemia, unspecified: Secondary | ICD-10-CM | POA: Diagnosis present

## 2017-05-27 DIAGNOSIS — G9341 Metabolic encephalopathy: Secondary | ICD-10-CM | POA: Diagnosis present

## 2017-05-27 DIAGNOSIS — Z794 Long term (current) use of insulin: Secondary | ICD-10-CM

## 2017-05-27 DIAGNOSIS — N390 Urinary tract infection, site not specified: Secondary | ICD-10-CM | POA: Diagnosis present

## 2017-05-27 DIAGNOSIS — D509 Iron deficiency anemia, unspecified: Secondary | ICD-10-CM | POA: Diagnosis present

## 2017-05-27 DIAGNOSIS — I10 Essential (primary) hypertension: Secondary | ICD-10-CM | POA: Diagnosis present

## 2017-05-27 DIAGNOSIS — J181 Lobar pneumonia, unspecified organism: Secondary | ICD-10-CM | POA: Diagnosis present

## 2017-05-27 DIAGNOSIS — E11649 Type 2 diabetes mellitus with hypoglycemia without coma: Secondary | ICD-10-CM

## 2017-05-27 DIAGNOSIS — N183 Chronic kidney disease, stage 3 unspecified: Secondary | ICD-10-CM | POA: Diagnosis present

## 2017-05-27 DIAGNOSIS — E1122 Type 2 diabetes mellitus with diabetic chronic kidney disease: Secondary | ICD-10-CM | POA: Diagnosis present

## 2017-05-27 DIAGNOSIS — D518 Other vitamin B12 deficiency anemias: Secondary | ICD-10-CM

## 2017-05-27 LAB — GLUCOSE, CAPILLARY
GLUCOSE-CAPILLARY: 47 mg/dL — AB (ref 65–99)
GLUCOSE-CAPILLARY: 156 mg/dL — AB (ref 65–99)
Glucose-Capillary: 91 mg/dL (ref 65–99)

## 2017-05-27 LAB — LEGIONELLA PNEUMOPHILA SEROGP 1 UR AG: L. pneumophila Serogp 1 Ur Ag: NEGATIVE

## 2017-05-27 LAB — HIV ANTIBODY (ROUTINE TESTING W REFLEX): HIV Screen 4th Generation wRfx: NONREACTIVE

## 2017-05-27 MED ORDER — CYANOCOBALAMIN 1000 MCG PO TABS: 1000.0000 ug | ORAL_TABLET | Freq: Every day | ORAL | 0 refills | Status: DC

## 2017-05-27 MED ORDER — INSULIN GLARGINE 100 UNITS/ML SOLOSTAR PEN: 15.0000 [IU] | PEN_INJECTOR | Freq: Every day | SUBCUTANEOUS | 11 refills | Status: DC

## 2017-05-27 MED ORDER — FERROUS SULFATE 325 (65 FE) MG PO TABS
325.0000 mg | ORAL_TABLET | Freq: Two times a day (BID) | ORAL | Status: DC
  Administered 2017-05-27: 325 mg via ORAL
  Filled 2017-05-27: qty 1

## 2017-05-27 MED ORDER — LEVOFLOXACIN 750 MG PO TABS: 750.0000 mg | ORAL_TABLET | ORAL | 0 refills | Status: AC

## 2017-05-27 MED ORDER — AZITHROMYCIN 250 MG PO TABS: 500.0000 mg | ORAL_TABLET | ORAL | Status: DC

## 2017-05-27 MED ORDER — VITAMIN B-12 1000 MCG PO TABS
1000.0000 ug | ORAL_TABLET | Freq: Every day | ORAL | Status: DC
  Administered 2017-05-27: 1000 ug via ORAL
  Filled 2017-05-27: qty 1

## 2017-05-27 MED ORDER — LEVOFLOXACIN 750 MG PO TABS: 750.0000 mg | ORAL_TABLET | ORAL | Status: DC

## 2017-05-27 MED ORDER — FERROUS SULFATE 325 (65 FE) MG PO TABS: 325.0000 mg | ORAL_TABLET | Freq: Two times a day (BID) | ORAL | 0 refills | Status: AC

## 2017-05-27 NOTE — Progress Notes (Signed)
Pharmacy Antibiotic Note  Amy Stein is a 84 y.o. female admitted on 05/25/2017 with pneumonia and UTI.  Pharmacy has been consulted for Levaquin dosing.  Plan: Levofloxacin 750 mg PO Q48 hours.  Duration 7 days total from admssion  Height: 5\' 2"  (157.5 cm) Weight: 163 lb 5.8 oz (74.1 kg) IBW/kg (Calculated) : 50.1  Temp (24hrs), Avg:98.3 F (36.8 C), Min:98.1 F (36.7 C), Max:98.6 F (37 C)  Recent Labs  Lab 05/25/17 1536 05/25/17 1537 05/25/17 1750 05/25/17 1754 05/26/17 1303  WBC 8.1  --   --  9.9  --   CREATININE 2.05*  --   --   --  1.22*  LATICACIDVEN  --  1.2 0.81 0.5  --     Estimated Creatinine Clearance: 34.1 mL/min (A) (by C-G formula based on SCr of 1.22 mg/dL (H)).    Allergies  Allergen Reactions  . Sulfonamide Derivatives Rash    Antimicrobials this admission: Azithromycin 2/14 >> 2/15 Ceftriaxone 2/14 >> 2/15 Levaquin 2/15>>2/20  Dose adjustments this admission: Levaquin 750 mg PO Q48 hours for CrCl<50  Microbiology results: 2/13 BCx: NGTD    Thank you for allowing pharmacy to be a part of this patient's care.  Margot Ables, PharmD Clinical Pharmacist 05/27/2017 10:56 AM

## 2017-05-27 NOTE — Progress Notes (Signed)
Patient is to be discharged home and in stable condition. Patient's IV and telemetry removed, WNL. Patient given discharge instructions and verbalized understanding. Patient awaiting transportation at this time.  Celestia Khat, RN

## 2017-05-27 NOTE — Plan of Care (Signed)
  Acute Rehab PT Goals(only PT should resolve) Patient Will Perform Sitting Balance 05/27/2017 1147 - Completed/Met by Hartnett-Rands, Pamala Hurry, PT Flowsheets Taken 05/27/2017 1147  Patient will perform sitting balance Independently Patient Will Transfer Sit To/From Stand 05/27/2017 1147 - Completed/Met by Hartnett-Rands, Pamala Hurry, PT Flowsheets Taken 05/27/2017 1147  Patient will transfer sit to/from stand with modified independence Pt Will Transfer Bed To Chair/Chair To Bed 05/27/2017 1147 - Completed/Met by Hartnett-Rands, Pamala Hurry, PT Flowsheets Taken 05/27/2017 1147  Pt will Transfer Bed to Chair/Chair to Bed with modified independence Pt Will Ambulate 05/27/2017 1147 - Completed/Met by Hartnett-Rands, Pamala Hurry, PT Flowsheets Taken 05/27/2017 1147  Pt will Ambulate > 125 feet;with modified independence;with cane Pt Will Verbalize and Adhere to Precautions While Description PT Will Verbalize and Adhere to Precautions While Performing Mobility 05/27/2017 1147 - Completed/Met by Hartnett-Rands, Pamala Hurry, PT Note Using SPC at height that elbow is at a 10 degree bend.  Floria Raveling. Hartnett-Rands, MS, PT Per [place] [phone]

## 2017-05-27 NOTE — Discharge Summary (Signed)
Physician Discharge Summary  Amy Stein FTD:[id number] DOB: [birth year] DOA: 05/25/2017  PCP: Rosita Fire, MD  Admit date: 05/25/2017 Discharge date: 05/27/2017  Admitted From: Home Disposition: Home  Recommendations for Outpatient Follow-up:  1. Follow up with PCP in 1 week.  Patient will complete 7-day course of antibiotic (Levaquin) to cover both pneumonia and UTI on 2/21. 2. Please obtain chest x-ray in 4 weeks to ensure resolution. 3. Please monitor CBG as outpatient.  I have discontinued her glipizide and Januvia and reduced her Lantus dose given frequent hypoglycemia.  Please adjust dose of reintroduce medications based on her CBG as outpatient.  Home Health: RN Equipment/Devices: None  Discharge Condition: Fair CODE STATUS: Full code Diet recommendation: Carb modified    Discharge Diagnoses:  Principal Problem:   Lobar pneumonia ([place])  Active Problems:    Type 2 diabetes mellitus with hypoglycemia without coma (HCC)   Hypokalemia   UTI (urinary tract infection)   Iron deficiency anemia   B12 deficiency anemia   Acute metabolic encephalopathy   Essential hypertension  Brief narrative/HPI Please refer to admission H&P for details, in brief, 84 year old female with type 2 diabetes mellitus, hypertension presented with 2 weeks history of cough with sore throat and some shortness of breath without any fevers, chills. As per her grandson she has been confused over the past several days.  She saw her PCP on the day of admission who prescribed her Augmentin and Zithromax but was brought to the ED as she was found to be more confused.  Reportedly also had CBG 40s. Workup in the ED showed right upper lobe pneumonia.    Principal Problem: Lobar pneumonia HCC) Placed on empiric Rocephin and azithromycin.  Flu PCR negative.  Follow urine for strep antigen negative.  Blood culture without any growth.  Sputum culture with mixed bacteria. Remains afebrile.  I will discharge  her on oral Levaquin to complete 7-day course of antibiotics (will cover both pneumonia and UTI).  Follow-up chest x-ray in about 4 weeks to ensure resolution.  Active Problems:   Hypoglycemia Episodes of CBG in 30s-40s. Patient is on Lantus, glipizide and Januvia at home.  A1c of 7.6.  Suspect frequent hypoglycemia from combination of underlying infection, dehydration and poor p.o. intake.  I will reduce her Lantus dose to 15 units at bedtime (was taking 20 units) and I will discontinue both her glipizide and Januvia at home. We will arrange home health nurse to monitor her CBG.   UTI Denies any symptoms.    Culture not sent on admission.  Discharged on Levaquin to cover for UTI as well.  Acute metabolic encephalopathy Combination of pneumonia, UTI and hypoglycemia.  Also has low B12. Mental status appears to be back at baseline.     Hypokalemia Replenished.  HCTZ was held.  Acute  kidney injury ([place]) Possibly due to?  Dehydration and UTI. Both HCTZ and losartan were held while in the hospital. Renal function improved to baseline with IV fluids.    Essential hypertension Continue labetalol.  Resume HCTZ and losartan upon discharge.   Vitamin B12 deficiency B12 level of 201.  Started on supplement.  Iron deficiency anemia Added iron supplement.  Patient seen by PT and recommends no home health PT needs.  Will arrange home health RN given her acute infection and recurrent hypoglycemia.   Family Communication  : None at bedside.  Unable to reach grandson on the phone  Disposition Plan  : Home   Consults  :  None  Procedures  : None     Discharge Instructions   Allergies as of 05/27/2017      Reactions   Sulfonamide Derivatives Rash      Medication List    STOP taking these medications   amoxicillin-clavulanate 875-125 MG tablet Commonly known as:  AUGMENTIN   azithromycin 250 MG tablet Commonly known as:  ZITHROMAX   glipiZIDE 10 MG 24 hr  tablet Commonly known as:  GLUCOTROL XL   Na Sulfate-K Sulfate-Mg Sulf 17.5-3.13-1.6 GM/177ML Soln Commonly known as:  SUPREP BOWEL PREP KIT   sitaGLIPtin 100 MG tablet Commonly known as:  JANUVIA     TAKE these medications   aspirin EC 81 MG tablet Take 81 mg by mouth daily.   cyanocobalamin 1000 MCG tablet Take 1 tablet (1,000 mcg total) by mouth daily. Start taking on:  05/28/2017   ferrous sulfate 325 (65 FE) MG tablet Take 1 tablet (325 mg total) by mouth 2 (two) times daily with a meal.   gabapentin 400 MG capsule Commonly known as:  NEURONTIN Take 400 mg by mouth 3 (three) times daily.   hydrochlorothiazide 12.5 MG capsule Commonly known as:  MICROZIDE Take 12.5 mg by mouth daily.   insulin glargine 100 unit/mL Sopn Commonly known as:  LANTUS Inject 0.15 mLs (15 Units total) into the skin at bedtime. What changed:  how much to take   labetalol 100 MG tablet Commonly known as:  NORMODYNE Take 100 mg by mouth 2 (two) times daily.   levofloxacin 750 MG tablet Commonly known as:  LEVAQUIN Take 1 tablet (750 mg total) by mouth every other day for 4 days. Start taking on:  05/29/2017   losartan 100 MG tablet Commonly known as:  COZAAR Take 100 mg by mouth daily.   medroxyPROGESTERone 10 MG tablet Commonly known as:  PROVERA Take 10 mg See admin instructions by mouth. Take 10 mg by mouth daily for the first 10 days of the month   omeprazole 20 MG capsule Commonly known as:  PRILOSEC Take 20 mg by mouth daily.   oxybutynin 5 MG tablet Commonly known as:  DITROPAN Take 5 mg by mouth daily.   simvastatin 40 MG tablet Commonly known as:  ZOCOR Take 40 mg by mouth at bedtime.      Follow-up Information    Rosita Fire, MD. Schedule an appointment as soon as possible for a visit in 1 week(s).   Specialty:  Internal Medicine Contact information: [place] [phone]          Allergies  Allergen Reactions  .  Sulfonamide Derivatives Rash        Procedures/Studies: Dg Chest 2 View  Result Date: 05/25/2017 CLINICAL DATA:  Cough and mid chest pain since yesterday. Former smoker. EXAM: CHEST  2 VIEW COMPARISON:  05/13/2016 FINDINGS: Low lung volumes with increased interstitial lung markings bilaterally which may reflect acute bronchitic change. Superimposed right upper lobe airspace opacities consistent with pneumonia. Eventration of the left hemidiaphragm is redemonstrated. Heart is borderline enlarged with aortic atherosclerosis. Osteoarthritic degenerative changes are noted about both glenohumeral joints. No effusion or pneumothorax. IMPRESSION: Right upper lobe pneumonia superimposed on bronchitic change. Followup PA and lateral chest X-ray is recommended in 3-4 weeks following trial of antibiotic therapy to ensure resolution and exclude the unlikelihood of an underlying malignancy. Electronically Signed   By: Ashley Royalty M.D.   On: 05/25/2017 01:22       Subjective: Denies any symptoms.  Had elevated blood pressure last evening.  Discharge Exam: Vitals:   05/26/17 2300 05/27/17 0216  BP: 134/65 (!) 142/74  Pulse:  74  Resp:  18  Temp:  98.6 F (37 C)  SpO2:  100%   Vitals:   05/26/17 2145 05/26/17 2204 05/26/17 2300 05/27/17 0216  BP: (!) 182/102  134/65 (!) 142/74  Pulse:    74  Resp:    18  Temp:    98.6 F (37 C)  TempSrc:    Oral  SpO2:  95%  100%  Weight:      Height:        Gen: not in distress HEENT: Pallor present, moist mucosa, supple neck Chest:  Clear breath sounds bilaterally, no added sounds CVS: N S1&S2, no murmurs,  GI: soft, NT, ND, BS+ Musculoskeletal: warm, no edema CNS: Alert and oriented x3, nonfocal.     The results of significant diagnostics from this hospitalization (including imaging, microbiology, ancillary and laboratory) are listed below for reference.     Microbiology: Recent Results (from the past 240 hour(s))  Culture, blood (Routine  x 2)     Status: None (Preliminary result)   Collection Time: 05/25/17  3:36 PM  Result Value Ref Range Status   Specimen Description LEFT ANTECUBITAL  Final   Special Requests   Final    BOTTLES DRAWN AEROBIC AND ANAEROBIC Blood Culture results may not be optimal due to an excessive volume of blood received in culture bottles   Culture   Final    NO GROWTH 2 DAYS Performed at [facility], [address] [place], [place], [place] [address]    Report Status PENDING  Incomplete  Culture, blood (Routine x 2)     Status: None (Preliminary result)   Collection Time: 05/25/17  3:37 PM  Result Value Ref Range Status   Specimen Description RIGHT ANTECUBITAL  Final   Special Requests   Final    BOTTLES DRAWN AEROBIC AND ANAEROBIC Blood Culture results may not be optimal due to an excessive volume of blood received in culture bottles   Culture   Final    NO GROWTH 2 DAYS Performed at [facility], [address] [place], [place] [address]    Report Status PENDING  Incomplete  Culture, sputum-assessment     Status: None   Collection Time: 05/26/17  9:29 AM  Result Value Ref Range Status   Specimen Description EXPECTORATED SPUTUM  Final   Special Requests Normal  Final   Sputum evaluation   Final    THIS SPECIMEN IS ACCEPTABLE FOR SPUTUM CULTURE Performed at [facility], [address] [place], [place] [address]    Report Status 05/26/2017 FINAL  Final  Culture, respiratory (NON-Expectorated)     Status: None (Preliminary result)   Collection Time: 05/26/17  9:29 AM  Result Value Ref Range Status   Specimen Description   Final    EXPECTORATED SPUTUM Performed at [facility], [address] [place], [place] [address]    Special Requests   Final    Normal Reflexed from [id number] Performed at [facility], [address] [place], [place] [address]    Gram Stain   Final    ABUNDANT WBC PRESENT, PREDOMINANTLY PMN FEW GRAM POSITIVE COCCI RARE GRAM VARIABLE ROD RARE HYPHAL ELEMENTS SEEN Performed  at [facility], [place] [address] [place], [place] [address]    Culture PENDING  Incomplete   Report Status PENDING  Incomplete     Labs: BNP (  last 3 results) No results for input(s): BNP in the last 8760 hours. Basic Metabolic Panel: Recent Labs  Lab 05/25/17 1536 05/26/17 1303  NA 132* 139  K 3.3* 3.9  CL 97* 105  CO2 23 24  GLUCOSE 49* 99  BUN 34* 23*  CREATININE 2.05* 1.22*  CALCIUM 8.4* 8.3*   Liver Function Tests: Recent Labs  Lab 05/25/17 1536  AST 29  ALT 21  ALKPHOS 65  BILITOT 0.5  PROT 7.8  ALBUMIN 3.7   No results for input(s): LIPASE, AMYLASE in the last 168 hours. No results for input(s): AMMONIA in the last 168 hours. CBC: Recent Labs  Lab 05/25/17 1536 05/25/17 1754  WBC 8.1 9.9  NEUTROABS 6.4 7.7  HGB 10.7* 9.4*  HCT 34.6* 29.9*  MCV 89.6 88.7  PLT 152 131*   Cardiac Enzymes: No results for input(s): CKTOTAL, CKMB, CKMBINDEX, TROPONINI in the last 168 hours. BNP: Invalid input(s): POCBNP CBG: Recent Labs  Lab 05/26/17 2045 05/26/17 2108 05/27/17 0142 05/27/17 0732 05/27/17 1126  GLUCAP 32* 165* 91 47* 156*   D-Dimer No results for input(s): DDIMER in the last 72 hours. Hgb A1c Recent Labs    05/26/17 1303  HGBA1C 7.6*   Lipid Profile No results for input(s): CHOL, HDL, LDLCALC, TRIG, CHOLHDL, LDLDIRECT in the last 72 hours. Thyroid function studies Recent Labs    05/26/17 1303  TSH 0.793   Anemia work up Recent Labs    05/26/17 1303  VITAMINB12 201  TIBC 246*  IRON 13*   Urinalysis    Component Value Date/Time   COLORURINE STRAW (A) 05/26/2017 0000   APPEARANCEUR HAZY (A) 05/26/2017 0000   LABSPEC 1.004 (L) 05/26/2017 0000   PHURINE 6.0 05/26/2017 0000   GLUCOSEU NEGATIVE 05/26/2017 0000   HGBUR SMALL (A) 05/26/2017 0000   BILIRUBINUR NEGATIVE 05/26/2017 0000   KETONESUR NEGATIVE 05/26/2017 0000   PROTEINUR NEGATIVE 05/26/2017 0000   UROBILINOGEN 0.2 11/01/2009 0257   NITRITE NEGATIVE 05/26/2017  0000   LEUKOCYTESUR LARGE (A) 05/26/2017 0000   Sepsis Labs Invalid input(s): PROCALCITONIN,  WBC,  LACTICIDVEN Microbiology Recent Results (from the past 240 hour(s))  Culture, blood (Routine x 2)     Status: None (Preliminary result)   Collection Time: 05/25/17  3:36 PM  Result Value Ref Range Status   Specimen Description LEFT ANTECUBITAL  Final   Special Requests   Final    BOTTLES DRAWN AEROBIC AND ANAEROBIC Blood Culture results may not be optimal due to an excessive volume of blood received in culture bottles   Culture   Final    NO GROWTH 2 DAYS Performed at [facility], [address] [place], [place] [address]    Report Status PENDING  Incomplete  Culture, blood (Routine x 2)     Status: None (Preliminary result)   Collection Time: 05/25/17  3:37 PM  Result Value Ref Range Status   Specimen Description RIGHT ANTECUBITAL  Final   Special Requests   Final    BOTTLES DRAWN AEROBIC AND ANAEROBIC Blood Culture results may not be optimal due to an excessive volume of blood received in culture bottles   Culture   Final    NO GROWTH 2 DAYS Performed at [facility] [address] [place], [place] [address]    Report Status PENDING  Incomplete  Culture, sputum-assessment     Status: None   Collection Time: 05/26/17  9:29 AM  Result Value Ref Range Status   Specimen Description EXPECTORATED SPUTUM  Final   Special Requests Normal  Final   Sputum evaluation   Final    THIS SPECIMEN IS ACCEPTABLE FOR SPUTUM CULTURE Performed at [facility], [address], [place], [place] [address]    Report Status 05/26/2017 FINAL  Final  Culture, respiratory (NON-Expectorated)     Status: None (Preliminary result)   Collection Time: 05/26/17  9:29 AM  Result Value Ref Range Status   Specimen Description   Final    EXPECTORATED SPUTUM Performed at [facility], [address], [place], [place] [address]    Special Requests   Final    Normal Reflexed from [id number] Performed at [facility], [address] [place], [place] [address]    Gram Stain   Final    ABUNDANT WBC PRESENT, PREDOMINANTLY PMN FEW GRAM POSITIVE COCCI RARE GRAM VARIABLE ROD RARE HYPHAL ELEMENTS SEEN Performed at [facility], [place] [address], [place], [place] [address]    Culture PENDING  Incomplete   Report Status PENDING  Incomplete     Time coordinating discharge: Over 30 minutes  SIGNED:   Louellen Molder, MD  [facility] 05/27/2017, 11:52 AM Pager   If 7PM-7AM, please contact night-coverage [email or web address] Password [email or web address]

## 2017-05-27 NOTE — Care Management Note (Signed)
Case Management Note  Patient Details  Name: Amy Stein MRN: [id number] Date of Birth: [birth year]  Subjective/Objective:      Admitted with pneumonia. Pt is from home, ind with ADL's. Lives with grandson. [name] drives but pt does her own shopping, food prep and medication management. She uses a cane with ambulation and has a RW if she needs one. She has had HH in the past, through [facility] and would like them again.  On [facility] registry but not active, has only had admission in 6 months.             Action/Plan: DC home today. MD plans to order [facility] RN, ? PT, eval pending. Referral sent to [employer]ered[employer] Ide rep, who will pull pt info from chart. Pt aware HH has 48 hrs to make first visit. Pt doing well on RA. She has been referred for [facility] emmi calls. CM explained calls to pt, she plans to participate.   Expected Discharge Date:      05/27/2017            Expected Discharge Plan:  [place]  In-House Referral:  NA  Discharge planning Services  CM Consult  Post Acute Care Choice:  Home Health Choice offered to:  Patient  HH Arranged:  RN, PT, Disease Management [place] Agency:  [facility]  Status of Service:  Completed, signed off  Sherald Barge, RN 05/27/2017, 11:06 AM

## 2017-05-27 NOTE — Discharge Instructions (Signed)
Community-Acquired Pneumonia, Adult Pneumonia is an infection of the lungs. One type of pneumonia can happen while a person is in a hospital. A different type can happen when a person is not in a hospital (community-acquired pneumonia). It is easy for this kind to spread from person to person. It can spread to you if you breathe near an infected person who coughs or sneezes. Some symptoms include:  A dry cough.  A wet (productive) cough.  Fever.  Sweating.  Chest pain.  Follow these instructions at home:  Take over-the-counter and prescription medicines only as told by your doctor. ? Only take cough medicine if you are losing sleep. ? If you were prescribed an antibiotic medicine, take it as told by your doctor. Do not stop taking the antibiotic even if you start to feel better.  Sleep with your head and neck raised (elevated). You can do this by putting a few pillows under your head, or you can sleep in a recliner.  Do not use tobacco products. These include cigarettes, chewing tobacco, and e-cigarettes. If you need help quitting, ask your doctor.  Drink enough water to keep your pee (urine) clear or pale yellow. A shot (vaccine) can help prevent pneumonia. Shots are often suggested for:  People older than 82 years of age.  People older than 82 years of age: ? Who are having cancer treatment. ? Who have long-term (chronic) lung disease. ? Who have problems with their body's defense system (immune system).  You may also prevent pneumonia if you take these actions:  Get the flu (influenza) shot every year.  Go to the dentist as often as told.  Wash your hands often. If soap and water are not available, use hand sanitizer.  Contact a doctor if:  You have a fever.  You lose sleep because your cough medicine does not help. Get help right away if:  You are short of breath and it gets worse.  You have more chest pain.  Your sickness gets worse. This is very serious  if: ? You are an older adult. ? Your body's defense system is weak.  You cough up blood. This information is not intended to replace advice given to you by your health care provider. Make sure you discuss any questions you have with your health care provider. Document Released: 09/15/2007 Document Revised: 09/04/2015 Document Reviewed: 07/24/2014 Elsevier Interactive Patient Education  2018 [employer].    Hypoglycemia Hypoglycemia is when the sugar (glucose) level in the blood is too low. Symptoms of low blood sugar may include:  Feeling: ? Hungry. ? Worried or nervous (anxious). ? Sweaty and clammy. ? Confused. ? Dizzy. ? Sleepy. ? Sick to your stomach (nauseous).  Having: ? A fast heartbeat. ? A headache. ? A change in your vision. ? Jerky movements that you cannot control (seizure). ? Nightmares. ? Tingling or no feeling (numbness) around the mouth, lips, or tongue.  Having trouble with: ? Talking. ? Paying attention (concentrating). ? Moving (coordination). ? Sleeping.  Shaking.  Passing out (fainting).  Getting upset easily (irritability).  Low blood sugar can happen to people who have diabetes and people who do not have diabetes. Low blood sugar can happen quickly, and it can be an emergency. Treating Low Blood Sugar Low blood sugar is often treated by eating or drinking something sugary right away. If you can think clearly and swallow safely, follow the 15:15 rule:  Take 15 grams of a fast-acting carb (carbohydrate). Some fast-acting carbs are: ?  1 tube of glucose gel. ? 3 sugar tablets (glucose pills). ? 6-8 pieces of hard candy. ? 4 oz (120 mL) of fruit juice. ? 4 oz (120 mL) of regular (not diet) soda.  Check your blood sugar 15 minutes after you take the carb.  If your blood sugar is still at or below 70 mg/dL (3.9 mmol/L), take 15 grams of a carb again.  If your blood sugar does not go above 70 mg/dL (3.9 mmol/L) after 3 tries, get help right  away.  After your blood sugar goes back to normal, eat a meal or a snack within 1 hour.  Treating Very Low Blood Sugar If your blood sugar is at or below 54 mg/dL (3 mmol/L), you have very low blood sugar (severe hypoglycemia). This is an emergency. Do not wait to see if the symptoms will go away. Get medical help right away. Call your local emergency services (911 in the [place] Do not drive yourself to the hospital. If you have very low blood sugar and you cannot eat or drink, you may need a glucagon shot (injection). A family member or friend should learn how to check your blood sugar and how to give you a glucagon shot. Ask your doctor if you need to have a glucagon shot kit at home. Follow these instructions at home: General instructions  Avoid any diets that cause you to not eat enough food. Talk with your doctor before you start any new diet.  Take over-the-counter and prescription medicines only as told by your doctor.  Limit alcohol to no more than 1 drink per day for nonpregnant women and 2 drinks per day for men. One drink equals 12 oz of beer, 5 oz of wine, or 1 oz of hard liquor.  Keep all follow-up visits as told by your doctor. This is important. If You Have Diabetes:   Make sure you know the symptoms of low blood sugar.  Always keep a source of sugar with you, such as: ? Sugar. ? Sugar tablets. ? Glucose gel. ? Fruit juice. ? Regular soda (not diet soda). ? Milk. ? Hard candy. ? Honey.  Take your medicines as told.  Follow your exercise and meal plan. ? Eat on time. Do not skip meals. ? Follow your sick day plan when you cannot eat or drink normally. Make this plan ahead of time with your doctor.  Check your blood sugar as often as told by your doctor. Always check before and after exercise.  Share your diabetes care plan with: ? Your work or school. ? People you live with.  Check your pee (urine) for ketones: ? When you are sick. ? As told by your  doctor.  Carry a card or wear jewelry that says you have diabetes. If You Have Low Blood Sugar From Other Causes:   Check your blood sugar as often as told by your doctor.  Follow instructions from your doctor about what you cannot eat or drink. Contact a doctor if:  You have trouble keeping your blood sugar in your target range.  You have low blood sugar often. Get help right away if:  You still have symptoms after you eat or drink something sugary.  Your blood sugar is at or below 54 mg/dL (3 mmol/L).  You have jerky movements that you cannot control.  You pass out. These symptoms may be an emergency. Do not wait to see if the symptoms will go away. Get medical help right away. Call your  local emergency services (911 in the [place] Do not drive yourself to the hospital. This information is not intended to replace advice given to you by your health care provider. Make sure you discuss any questions you have with your health care provider. Document Released: 06/23/2009 Document Revised: 09/04/2015 Document Reviewed: 05/02/2015 Elsevier Interactive Patient Education  [employer].

## 2017-05-27 NOTE — Care Management Important Message (Signed)
Important Message  Patient Details  Name: Amy Stein MRN: [id number] Date of Birth: [birth year]   Medicare Important Message Given:  Yes    Sherald Barge, RN 05/27/2017, 11:04 AM

## 2017-05-27 NOTE — Evaluation (Signed)
Physical Therapy Evaluation Patient Details Name: Amy Stein MRN: [id number] DOB: 07/08/1933 Today's Date: 05/27/2017   History of Present Illness   84 year old female with type 2 diabetes mellitus, hypertension presented with 2 weeks history of cough with sore throat and some shortness of breath without any fevers, chills. As per her grandson she has been confused over the past several days.  She saw her PCP on the day of admission who prescribed her Augmentin and Zithromax but was brought to the ED as she was found to be more confused.  Reportedly also had CBG 40s. Workup in the ED showed right upper lobe pneumonia.    Clinical Impression  Patient is an 84 year old female who presented in the hospital with confusion, SOB, cough and sore throat. After interview and evaluation patient presents at near baseline levels. She is modified independent in bed mobility, modified independent in transfers with a [facility] and she is able to ambulate longer distances in the hallway while using a SPC and supervision assistance. Patient lives in a small one-level home with intermittent supervision from various family members and she is motivated to move and keep active. PT did educate patient on use of SPC at a lower level than the one she has at home. After discussion and trial of a lower SPC level patient was agreeable to changing the height of her home SPC. Patient evaluated by Physical Therapy with no further acute PT needs identified. All education has been completed and the patient has no further questions. PT is signing off. Thank you for this referral.     Follow Up Recommendations No PT follow up    Equipment Recommendations  None recommended by PT    Recommendations for Other Services       Precautions / Restrictions Precautions Precautions: Fall Precaution Comments: supervision to walk with nursing Restrictions Weight Bearing Restrictions: No      Mobility  Bed Mobility Overal bed mobility:  Modified Independent                Transfers Overall transfer level: Modified independent Equipment used: None                Ambulation/Gait Ambulation/Gait assistance: Modified independent (Device/Increase time);Supervision Ambulation Distance (Feet): 200 Feet Assistive device: Straight cane Gait Pattern/deviations: Step-through pattern;Decreased stride length   Gait velocity interpretation: at or above normal speed for age/gender    Stairs            Wheelchair Mobility    Modified Rankin (Stroke Patients Only)       Balance Overall balance assessment: Modified Independent Sitting-balance support: No upper extremity supported Sitting balance-Leahy Scale: Normal     Standing balance support: Single extremity supported Standing balance-Leahy Scale: Good Standing balance comment: patient was able to stand without assistance or AD while PT changed patient's gown.                             Pertinent Vitals/Pain Pain Assessment: No/denies pain    Home Living Family/patient expects to be discharged to:: Private residence Living Arrangements: Other relatives Available Help at Discharge: Family(grandson when not working) Type of Home: Mobile home Home Access: Stairs to enter Entrance Stairs-Rails: Chemical engineer of Steps: [place]: One level        Prior Function Level of Independence: Independent with assistive device(s)(uses SPC in the community sometimes)  Hand Dominance        Extremity/Trunk Assessment        Lower Extremity Assessment Lower Extremity Assessment: Overall WFL for tasks assessed;Generalized weakness    Cervical / Trunk Assessment Cervical / Trunk Assessment: Kyphotic(mild)  Communication   Communication: No difficulties  Cognition Arousal/Alertness: Awake/alert Behavior During Therapy: WFL for tasks assessed/performed Overall Cognitive Status: Within  Functional Limits for tasks assessed                                        General Comments      Exercises     Assessment/Plan    PT Assessment Patent does not need any further PT services  PT Problem List         PT Treatment Interventions      PT Goals (Current goals can be found in the Care Plan section)  Acute Rehab PT Goals Patient Stated Goal: return home PT Goal Formulation: With patient Time For Goal Achievement: 05/28/17 Potential to Achieve Goals: Good    Frequency     Barriers to discharge        Co-evaluation               AM-PAC PT "6 Clicks" Daily Activity  Outcome Measure Difficulty turning over in bed (including adjusting bedclothes, sheets and blankets)?: None Difficulty moving from lying on back to sitting on the side of the bed? : None Difficulty sitting down on and standing up from a chair with arms (e.g., wheelchair, bedside commode, etc,.)?: None Help needed moving to and from a bed to chair (including a wheelchair)?: None Help needed walking in hospital room?: A Little Help needed climbing 3-5 steps with a railing? : A Little 6 Click Score: 22    End of Session   Activity Tolerance: Patient tolerated treatment well Patient left: in bed;with call bell/phone within reach Nurse Communication: Mobility status(patient can ambulated with nursing using one hand held or IV pole) PT Visit Diagnosis: Muscle weakness (generalized) (M62.81);Unsteadiness on feet (R26.81)    Time: 1916-6060 PT Time Calculation (min) (ACUTE ONLY): 34 min   Charges:   PT Evaluation $PT Eval Low Complexity: 1 Low     PT G Codes:        Stacye Noori D. Hartnett-Rands, MS, PT Per [place] [phone] 05/27/2017, 11:38 AM

## 2017-05-29 ENCOUNTER — Emergency Department (HOSPITAL_COMMUNITY)
Admission: EM | Admit: 2017-05-29 | Discharge: 2017-05-30 | Disposition: A | Discharge status: Home / Self Care | Payer: Medicare Other | Attending: Emergency Medicine | Admitting: Emergency Medicine

## 2017-05-29 ENCOUNTER — Encounter (HOSPITAL_COMMUNITY): Payer: Self-pay | Admitting: Emergency Medicine

## 2017-05-29 ENCOUNTER — Other Ambulatory Visit: Payer: Self-pay

## 2017-05-29 ENCOUNTER — Emergency Department (HOSPITAL_COMMUNITY): Payer: Medicare Other

## 2017-05-29 DIAGNOSIS — I10 Essential (primary) hypertension: Secondary | ICD-10-CM | POA: Diagnosis not present

## 2017-05-29 DIAGNOSIS — E119 Type 2 diabetes mellitus without complications: Secondary | ICD-10-CM | POA: Insufficient documentation

## 2017-05-29 DIAGNOSIS — Z87891 Personal history of nicotine dependence: Secondary | ICD-10-CM | POA: Diagnosis not present

## 2017-05-29 DIAGNOSIS — Z79899 Other long term (current) drug therapy: Secondary | ICD-10-CM | POA: Insufficient documentation

## 2017-05-29 DIAGNOSIS — Z7982 Long term (current) use of aspirin: Secondary | ICD-10-CM | POA: Insufficient documentation

## 2017-05-29 DIAGNOSIS — R2689 Other abnormalities of gait and mobility: Secondary | ICD-10-CM | POA: Insufficient documentation

## 2017-05-29 DIAGNOSIS — M6281 Muscle weakness (generalized): Secondary | ICD-10-CM | POA: Diagnosis not present

## 2017-05-29 DIAGNOSIS — R4182 Altered mental status, unspecified: Secondary | ICD-10-CM | POA: Insufficient documentation

## 2017-05-29 DIAGNOSIS — R531 Weakness: Secondary | ICD-10-CM | POA: Insufficient documentation

## 2017-05-29 LAB — COMPREHENSIVE METABOLIC PANEL
ALT: 29 U/L (ref 14–54)
AST: 35 U/L (ref 15–41)
Albumin: 3.4 g/dL — ABNORMAL LOW (ref 3.5–5.0)
Alkaline Phosphatase: 75 U/L (ref 38–126)
Anion gap: 14 (ref 5–15)
BUN: 23 mg/dL — AB (ref 6–20)
CHLORIDE: 102 mmol/L (ref 101–111)
CO2: 20 mmol/L — ABNORMAL LOW (ref 22–32)
CREATININE: 1.53 mg/dL — AB (ref 0.44–1.00)
Calcium: 9.3 mg/dL (ref 8.9–10.3)
GFR calc Af Amer: 36 mL/min — ABNORMAL LOW (ref >60)
GFR, EST NON AFRICAN AMERICAN: 31 mL/min — AB (ref >60)
Glucose, Bld: 139 mg/dL — ABNORMAL HIGH (ref 65–99)
Potassium: 3.5 mmol/L (ref 3.5–5.1)
Sodium: 136 mmol/L (ref 135–145)
Total Bilirubin: 0.6 mg/dL (ref 0.3–1.2)
Total Protein: 7.9 g/dL (ref 6.5–8.1)

## 2017-05-29 LAB — CBC WITH DIFFERENTIAL/PLATELET
Basophils Absolute: 0 10*3/uL (ref 0.0–0.1)
Basophils Relative: 0 %
EOS ABS: 0.1 10*3/uL (ref 0.0–0.7)
EOS PCT: 1 %
HCT: 36.6 % (ref 36.0–46.0)
Hemoglobin: 11.9 g/dL — ABNORMAL LOW (ref 12.0–15.0)
LYMPHS ABS: 1.5 10*3/uL (ref 0.7–4.0)
Lymphocytes Relative: 20 %
MCH: 28.3 pg (ref 26.0–34.0)
MCHC: 32.5 g/dL (ref 30.0–36.0)
MCV: 86.9 fL (ref 78.0–100.0)
MONO ABS: 0.5 10*3/uL (ref 0.1–1.0)
Monocytes Relative: 7 %
Neutro Abs: 5.6 10*3/uL (ref 1.7–7.7)
Neutrophils Relative %: 72 %
PLATELETS: 256 10*3/uL (ref 150–400)
RBC: 4.21 MIL/uL (ref 3.87–5.11)
RDW: 13.9 % (ref 11.5–15.5)
WBC: 7.7 10*3/uL (ref 4.0–10.5)

## 2017-05-29 LAB — CULTURE, RESPIRATORY W GRAM STAIN: Special Requests: NORMAL

## 2017-05-29 LAB — CULTURE, RESPIRATORY: CULTURE: NORMAL

## 2017-05-29 LAB — CBG MONITORING, ED: Glucose-Capillary: 125 mg/dL — ABNORMAL HIGH (ref 65–99)

## 2017-05-29 LAB — I-STAT CG4 LACTIC ACID, ED: Lactic Acid, Venous: 1.24 mmol/L (ref 0.5–1.9)

## 2017-05-29 NOTE — ED Triage Notes (Signed)
Per grandson patient was seen Tuesday at [facility].  Per family doctor xray showed pneumonia.  Has been treated with antibiotics.  Per grandson ended up admitted due to low blood sugar.  While in hospital felt well and was in good spirits.  After going home started declining once again with confusion, weakness, and sob with exertion.   Family would like work up for either nursing home placement or home health.

## 2017-05-30 ENCOUNTER — Emergency Department (HOSPITAL_COMMUNITY): Payer: Medicare Other

## 2017-05-30 DIAGNOSIS — R4182 Altered mental status, unspecified: Secondary | ICD-10-CM | POA: Diagnosis not present

## 2017-05-30 LAB — URINALYSIS, ROUTINE W REFLEX MICROSCOPIC
BILIRUBIN URINE: NEGATIVE
Glucose, UA: 50 mg/dL — AB
HGB URINE DIPSTICK: NEGATIVE
Ketones, ur: 5 mg/dL — AB
NITRITE: NEGATIVE
PH: 5 (ref 5.0–8.0)
Protein, ur: 100 mg/dL — AB
SPECIFIC GRAVITY, URINE: 1.024 (ref 1.005–1.030)

## 2017-05-30 LAB — CULTURE, BLOOD (ROUTINE X 2)
Culture: NO GROWTH
Culture: NO GROWTH

## 2017-05-30 LAB — I-STAT TROPONIN, ED: TROPONIN I, POC: 0.01 ng/mL (ref 0.00–0.08)

## 2017-05-30 LAB — CBG MONITORING, ED: GLUCOSE-CAPILLARY: 237 mg/dL — AB (ref 65–99)

## 2017-05-30 MED ORDER — SODIUM CHLORIDE 0.9 % IV SOLN: 2.0000 g | Freq: Once | INTRAVENOUS | Status: DC

## 2017-05-30 MED ORDER — LOSARTAN POTASSIUM 50 MG PO TABS
100.0000 mg | ORAL_TABLET | Freq: Every day | ORAL | Status: DC
  Filled 2017-05-30: qty 2

## 2017-05-30 MED ORDER — LEVOFLOXACIN 500 MG PO TABS: 500.0000 mg | ORAL_TABLET | ORAL | Status: DC

## 2017-05-30 MED ORDER — PANTOPRAZOLE SODIUM 40 MG PO TBEC
40.0000 mg | DELAYED_RELEASE_TABLET | Freq: Every day | ORAL | Status: DC
  Administered 2017-05-30: 40 mg via ORAL
  Filled 2017-05-30: qty 1

## 2017-05-30 MED ORDER — VANCOMYCIN HCL IN DEXTROSE 750-5 MG/150ML-% IV SOLN: 750.0000 mg | INTRAVENOUS | Status: DC

## 2017-05-30 MED ORDER — VANCOMYCIN HCL IN DEXTROSE 1-5 GM/200ML-% IV SOLN: 1000.0000 mg | Freq: Once | INTRAVENOUS | Status: DC

## 2017-05-30 MED ORDER — FERROUS SULFATE 325 (65 FE) MG PO TABS
325.0000 mg | ORAL_TABLET | Freq: Two times a day (BID) | ORAL | Status: DC
  Filled 2017-05-30: qty 1

## 2017-05-30 MED ORDER — GABAPENTIN 400 MG PO CAPS
400.0000 mg | ORAL_CAPSULE | Freq: Three times a day (TID) | ORAL | Status: DC
  Administered 2017-05-30: 400 mg via ORAL
  Filled 2017-05-30: qty 1

## 2017-05-30 MED ORDER — ASPIRIN EC 81 MG PO TBEC
81.0000 mg | DELAYED_RELEASE_TABLET | Freq: Every day | ORAL | Status: DC
  Administered 2017-05-30: 81 mg via ORAL
  Filled 2017-05-30: qty 1

## 2017-05-30 MED ORDER — LEVOFLOXACIN 750 MG PO TABS: 750.0000 mg | ORAL_TABLET | ORAL | Status: DC

## 2017-05-30 MED ORDER — LABETALOL HCL 200 MG PO TABS
100.0000 mg | ORAL_TABLET | Freq: Two times a day (BID) | ORAL | Status: DC
  Administered 2017-05-30: 100 mg via ORAL
  Filled 2017-05-30: qty 1

## 2017-05-30 MED ORDER — SODIUM CHLORIDE 0.9 % IV SOLN: 1.0000 g | INTRAVENOUS | Status: DC

## 2017-05-30 MED ORDER — HYDROCHLOROTHIAZIDE 12.5 MG PO CAPS
12.5000 mg | ORAL_CAPSULE | Freq: Every day | ORAL | Status: DC
  Administered 2017-05-30: 12.5 mg via ORAL
  Filled 2017-05-30: qty 1

## 2017-05-30 MED ORDER — OXYBUTYNIN CHLORIDE 5 MG PO TABS
5.0000 mg | ORAL_TABLET | Freq: Every day | ORAL | Status: DC
  Filled 2017-05-30: qty 1

## 2017-05-30 MED ORDER — SIMVASTATIN 40 MG PO TABS: 40.0000 mg | ORAL_TABLET | Freq: Every day | ORAL | Status: DC

## 2017-05-30 MED ORDER — INSULIN ASPART 100 UNIT/ML ~~LOC~~ SOLN
0.0000 [IU] | SUBCUTANEOUS | Status: DC
  Administered 2017-05-30: 5 [IU] via SUBCUTANEOUS
  Filled 2017-05-30: qty 1

## 2017-05-30 NOTE — ED Provider Notes (Addendum)
[facility] EMERGENCY DEPARTMENT Provider Note   CSN: [id number] Arrival date & time: 05/29/17  1957     History   Chief Complaint Chief Complaint  Patient presents with  . Altered Mental Status  . in home or nursing home assistance.    HPI Amy Stein is a 84 y.o. female.  Patient brought to the emergency department by family for evaluation.  Patient was hospitalized at [facility] from February 13 - February 15 for community-acquired pneumonia.  He was discharged home.  Family reports that for the first day at home she was doing well, but now she has had progressively worsening generalized weakness and confusion.  Son reports that he was unable to get her out of bed this morning because she was so tired.  She was weak, could not stand.  She kept falling asleep through the course of the day.  She has not eaten or drank anything.  He does not feel like he can care for her at home anymore.      Past Medical History:  Diagnosis Date  . Diabetes mellitus   . Hypertension   . Shingles    on both legs 1/18    Patient Active Problem List   Diagnosis Date Noted  . Iron deficiency anemia 05/27/2017  . B12 deficiency anemia 05/27/2017  . UTI (urinary tract infection) 05/27/2017  . Acute metabolic encephalopathy 41/32/4401  . Type 2 diabetes mellitus with hypoglycemia without coma ([place]) 05/27/2017  . Lobar pneumonia ([place]) 05/27/2017  . Essential hypertension 05/27/2017  . Hypokalemia 05/25/2017  . Hypoglycemia 05/25/2017  . TEAR MEDIAL MENISCUS 09/04/2008  . KNEE PAIN, RIGHT 07/03/2008  . FRACTURE, ANKLE, LEFT 05/14/2008    Past Surgical History:  Procedure Laterality Date  . COLONOSCOPY    . COLONOSCOPY N/A 02/23/2017   Procedure: COLONOSCOPY;  Surgeon: Daneil Dolin, MD;  Location: [facility] ENDO SUITE;  Service: Endoscopy;  Laterality: N/A;  9:30 Am  . POLYPECTOMY  02/23/2017   Procedure: POLYPECTOMY;  Surgeon: Daneil Dolin, MD;  Location: [facility] ENDO  SUITE;  Service: Endoscopy;;  cecum    OB History    Gravida Para Term Preterm AB Living   4 4       4    SAB TAB Ectopic Multiple Live Births                   Home Medications    Prior to Admission medications   Medication Sig Start Date End Date Taking? Authorizing Provider  aspirin EC 81 MG tablet Take 81 mg by mouth daily.   Yes [provider]  ferrous sulfate 325 (65 FE) MG tablet Take 1 tablet (325 mg total) by mouth 2 (two) times daily with a meal. 05/27/17  Yes Dhungel, Nishant, MD  gabapentin (NEURONTIN) 400 MG capsule Take 400 mg by mouth 3 (three) times daily.    Yes [provider]  hydrochlorothiazide (MICROZIDE) 12.5 MG capsule Take 12.5 mg by mouth daily.   Yes [provider]  insulin glargine (LANTUS) 100 unit/mL SOPN Inject 0.15 mLs (15 Units total) into the skin at bedtime. 05/27/17  Yes Dhungel, Nishant, MD  labetalol (NORMODYNE) 100 MG tablet Take 100 mg by mouth 2 (two) times daily.    Yes [provider]  levofloxacin (LEVAQUIN) 750 MG tablet Take 1 tablet (750 mg total) by mouth every other day for 4 days. 05/29/17 06/02/17 Yes Dhungel, Nishant, MD  losartan (COZAAR) 100 MG tablet Take 100  mg by mouth daily.   Yes [provider]  medroxyPROGESTERone (PROVERA) 10 MG tablet Take 10 mg See admin instructions by mouth. Take 10 mg by mouth daily for the first 10 days of the month   Yes [provider]  omeprazole (PRILOSEC) 20 MG capsule Take 20 mg by mouth daily.   Yes [provider]  oxybutynin (DITROPAN) 5 MG tablet Take 5 mg by mouth daily.   Yes [provider]  simvastatin (ZOCOR) 40 MG tablet Take 40 mg by mouth at bedtime.    Yes [provider]  vitamin B-12 1000 MCG tablet Take 1 tablet (1,000 mcg total) by mouth daily. 05/28/17  Yes Dhungel, Flonnie Overman, MD    Family History Family History  Problem Relation Age of Onset  . Throat cancer Father   . Pneumonia Mother   .  Pancreatic cancer Brother     Social History Social History   Tobacco Use  . Smoking status: Former Research scientist (life sciences)  . Smokeless tobacco: Never Used  Substance Use Topics  . Alcohol use: No  . Drug use: No     Allergies   Sulfonamide derivatives   Review of Systems Review of Systems  Constitutional: Positive for activity change, appetite change and fatigue.  Psychiatric/Behavioral: Positive for confusion.  All other systems reviewed and are negative.    Physical Exam Updated Vital Signs BP (!) 181/107   Pulse 90   Temp 98.2 F (36.8 C) (Oral)   Resp 18   Ht 5\' 2"  (1.575 m)   Wt 70.8 kg (156 lb)   SpO2 100%   BMI 28.53 kg/m   Physical Exam  Constitutional: She appears well-developed and well-nourished. No distress.  HENT:  Head: Normocephalic and atraumatic.  Right Ear: Hearing normal.  Left Ear: Hearing normal.  Nose: Nose normal.  Mouth/Throat: Oropharynx is clear and moist and mucous membranes are normal.  Eyes: Conjunctivae and EOM are normal. Pupils are equal, round, and reactive to light.  Neck: Normal range of motion. Neck supple.  Cardiovascular: Regular rhythm, S1 normal and S2 normal. Exam reveals no gallop and no friction rub.  No murmur heard. Pulmonary/Chest: Effort normal and breath sounds normal. No respiratory distress. She exhibits no tenderness.  Abdominal: Soft. Normal appearance and bowel sounds are normal. There is no hepatosplenomegaly. There is no tenderness. There is no rebound, no guarding, no tenderness at McBurney's point and negative Murphy's sign. No hernia.  Musculoskeletal: Normal range of motion.  Neurological: She is alert. She has normal strength. No cranial nerve deficit or sensory deficit. Coordination normal. GCS eye subscore is 4. GCS verbal subscore is 5. GCS motor subscore is 6.  Skin: Skin is warm, dry and intact. No rash noted. No cyanosis.  Psychiatric: She has a normal mood and affect. Her speech is normal and behavior is  normal. Thought content normal.  Nursing note and vitals reviewed.    ED Treatments / Results  Labs (all labs ordered are listed, but only abnormal results are displayed) Labs Reviewed  COMPREHENSIVE METABOLIC PANEL - Abnormal; Notable for the following components:      Result Value   CO2 20 (*)    Glucose, Bld 139 (*)    BUN 23 (*)    Creatinine, Ser 1.53 (*)    Albumin 3.4 (*)    GFR calc non Af Amer 31 (*)    GFR calc Af Amer 36 (*)    All other components within normal limits  CBC WITH DIFFERENTIAL/PLATELET -  Abnormal; Notable for the following components:   Hemoglobin 11.9 (*)    All other components within normal limits  URINALYSIS, ROUTINE W REFLEX MICROSCOPIC - Abnormal; Notable for the following components:   APPearance CLOUDY (*)    Glucose, UA 50 (*)    Ketones, ur 5 (*)    Protein, ur 100 (*)    Leukocytes, UA SMALL (*)    Bacteria, UA RARE (*)    Squamous Epithelial / LPF 6-30 (*)    All other components within normal limits  CBG MONITORING, ED - Abnormal; Notable for the following components:   Glucose-Capillary 125 (*)    All other components within normal limits  CBG MONITORING, ED - Abnormal; Notable for the following components:   Glucose-Capillary 237 (*)    All other components within normal limits  URINE CULTURE  I-STAT CG4 LACTIC ACID, ED  I-STAT TROPONIN, ED    EKG  EKG Interpretation  Date/Time:  Monday May 30 2017 08:16:22 EST Ventricular Rate:  104 PR Interval:    QRS Duration: 132 QT Interval:  401 QTC Calculation: 497 R Axis:   54 Text Interpretation:  Sinus tachycardia Right bundle branch block similar to prior Confirmed by Aletta Edouard [phone]) on 05/30/2017 8:25:01 AM       Radiology Dg Chest 2 View  Result Date: 05/29/2017 CLINICAL DATA:  Cough, recent pneumonia. EXAM: CHEST  2 VIEW COMPARISON:  Chest x-rays dated 05/25/2017 and 05/13/2016 FINDINGS: Heart size and mediastinal contours are stable. Atherosclerotic changes  again noted at the aortic arch. Interval resolution of the right upper lobe pneumonia. Lungs now appear clear. No pleural effusion or pneumothorax seen. No acute or suspicious osseous finding. IMPRESSION: Interval resolution of the right upper lobe pneumonia. Lungs are now clear. No acute findings. Electronically Signed   By: Franki Cabot M.D.   On: 05/29/2017 21:42   Ct Head Wo Contrast  Result Date: 05/30/2017 CLINICAL DATA:  Altered level of consciousness. Confusion and weakness. EXAM: CT HEAD WITHOUT CONTRAST TECHNIQUE: Contiguous axial images were obtained from the base of the skull through the vertex without intravenous contrast. COMPARISON:  Head CT 08/29/2016 FINDINGS: Brain: Unchanged age related atrophy and mild chronic small vessel ischemia. No intracranial hemorrhage, mass effect, or midline shift. No hydrocephalus. The basilar cisterns are patent. No evidence of territorial infarct or acute ischemia. No extra-axial or intracranial fluid collection. Vascular: Atherosclerosis of skullbase vasculature without hyperdense vessel or abnormal calcification. Skull: No fracture or focal lesion. Sinuses/Orbits: Chronic inflammatory change about the paranasal sinuses with worsening mucosal thickening of the ethmoid air cells and right sphenoid sinus, but improved right maxillary sinusitis from prior. Visualized orbits are unremarkable. Other: None. IMPRESSION: 1.  No acute intracranial abnormality. 2. Stable mild chronic small vessel ischemia. 3. Chronic paranasal sinus disease. Electronically Signed   By: Jeb Levering M.D.   On: 05/30/2017 01:38    Procedures Procedures (including critical care time)  Medications Ordered in ED Medications - No data to display   Initial Impression / Assessment and Plan / ED Course  I have reviewed the triage vital signs and the nursing notes.  Pertinent labs & imaging results that were available during my care of the patient were reviewed by me and considered  in my medical decision making (see chart for details).  Clinical Course as of May 30 2313  Mon May 30, 2017  0830 Patient signed out to me from Dr. Betsey Holiday.  84 year old female with dementia here with with weakness.  She had just been admitted to [facility] for pneumonia and is still on antibiotics.  This I note plan is that the patient is waiting on physical therapy and social service consults for consideration for placement versus returning home with services.  I was called to the patient's room by the nurse that the patient was having chest pain.  She states it just started a little while ago was sharp in nature substernal and that it is resolved.  Repeat EKG was done which showed no change.  We are drawing a troponin now.  [MB]  [phone] Patient was seen with case management and she already has services at home she is getting her PT consult now and looks to be walking around the department very easily.  [MB]    Clinical Course User Index [MB] Hayden Rasmussen, MD    Patient does not appear in any distress at this time.  Her vital signs are normal.  Her workup is essentially unremarkable as well.  Pneumonia has cleared.  Reviewing the records reveals that she was treated for urinary tract infection as well during her hospitalization.  No urine culture was performed.  Her urinalysis does appear improved today.  It also appears contaminated, cannot determine if there is any persistent infection at this time but certainly does not look as abnormal as her initial urinalysis during hospitalization.  She is on Levaquin.  Patient does appear weak and slightly confused.  This is a change for her.  Family cannot care for her at this time.  I do not find any abnormalities that would require repeat hospitalization.  We will have social work and case management consultation in the morning.  Final Clinical Impressions(s) / ED Diagnoses   Final diagnoses:  Altered mental status, unspecified altered mental status  type  Weakness generalized    ED Discharge Orders    None       Rayanne Padmanabhan, Gwenyth Allegra, MD 05/30/17 0335    Orpah Greek, MD 05/30/17 2315

## 2017-05-30 NOTE — Clinical Social Work Note (Signed)
Clinical Social Work Assessment  Patient Details  Name: Amy Stein MRN: [id number] Date of Birth: Aug 17, 1933  Date of referral:  05/23/17               Reason for consult:  Facility Placement                Permission sought to share information with:  Family Supports Permission granted to share information::  Yes, Verbal Permission Granted  Name::     Tolulope Pinkett   Agency::  family  Relationship::  grandson  Sport and exercise psychologist Information:  Angeletta Goelz [phone]  Housing/Transportation Living arrangements for the past 2 months:  Single Family Home(with grandons) Source of Information:  Other (Comment Required)(grandson Environmental education officer) Patient Interpreter Needed:  None Criminal Activity/Legal Involvement Pertinent to Current Situation/Hospitalization:  No - Comment as needed Significant Relationships:  Other Family Members, Siblings Lives with:  Other (Comment)(grandsons. ) Do you feel safe going back to the place where you live?  Yes Need for family participation in patient care:  Yes (Comment)  Care giving concerns:  CSW spoke with pt's grandson Tashaun via phone. CSW was informed that at this time concerns are very few, aside from wanting the best care for pt while in the home.    Social Worker assessment / plan: CSW spoke with pt's grandson via phone. CSW was informed that pt is from home where pt lives with both grandsons. Grandson reports that pt recently started to decline after being diagnosed with pneumonia. Per grandson pt is usually able to do things on pt's own however now pt needs a little more assistance. CSW spoke with grandson about placement options as well as [place] health options. CSW was informed that neither pt or other family members are wanting that at this time. They are agreeable to [facility] services as they understand themselves that pt would like to be in home with further help. CSW explained that CSW would have RNCM follow up with family about [facility] options as needed.    Employment status:  Retired Research officer, political party.) PT Recommendations:  Not assessed at this time Information / Referral to community resources:  Other (Comment Required)(home health. )  Patient/Family's Response to care:  Pt's grandson appeared to be understanding and agreeable to plan of care at this time.   Patient/Family's Understanding of and Emotional Response to Diagnosis, Current Treatment, and Prognosis:  No further questions or concerns have been presented to CSW at this time.   Emotional Assessment Appearance:  Appears stated age Attitude/Demeanor/Rapport:  Engaged Affect (typically observed):  Pleasant Orientation:  (unsure as pt presents with AMS. ) Alcohol / Substance use:  Not Applicable Psych involvement (Current and /or in the community):  No (Comment)  Discharge Needs  Concerns to be addressed:  Care Coordination Readmission within the last 30 days:  Yes Current discharge risk:  None Barriers to Discharge:  No Barriers Identified   Wetzel Bjornstad, [place] 05/30/2017, 8:02 AM

## 2017-05-30 NOTE — ED Notes (Signed)
Patient transported to CT 

## 2017-05-30 NOTE — Progress Notes (Signed)
Pharmacy Antibiotic Note  Amy Stein is a 84 y.o. female who was recently discharged from [facility] after admission for CAP. Treated with azithromycin and ceftriaxone then sent home on po levaquin. Returns to ED today with altered mental status > code sepsis called. She will begin vancomycin and cefepime. Renal insufficiency noted with sCr 1.5 and CrCl 26 ml/min.  Vancomycin trough goal 15-20   Plan: 1) Vancomycin 1g IV x 1 then 750mg  IV q24 2) Cefepime 2g IV x 1 then 1g IV q24 3) Follow renal function, cultures, LOT, level if needed  Height: 5\' 2"  (157.5 cm) Weight: 156 lb (70.8 kg) IBW/kg (Calculated) : 50.1  Temp (24hrs), Avg:98.3 F (36.8 C), Min:98.2 F (36.8 C), Max:98.3 F (36.8 C)  Recent Labs  Lab 05/25/17 1536 05/25/17 1537 05/25/17 1750 05/25/17 1754 05/26/17 1303 05/29/17 2044 05/29/17 2112  WBC 8.1  --   --  9.9  --  7.7  --   CREATININE 2.05*  --   --   --  1.22* 1.53*  --   LATICACIDVEN  --  1.2 0.81 0.5  --   --  1.24    Estimated Creatinine Clearance: 26.6 mL/min (A) (by C-G formula based on SCr of 1.53 mg/dL (H)).    Allergies  Allergen Reactions  . Sulfonamide Derivatives Rash    Antimicrobials this admission: 2/18 Vancomycin >> 2/18 Cefepime >>  Dose adjustments this admission: n/a  Microbiology results: pending  Thank you for allowing pharmacy to be a part of this patient's care.  Deboraha Sprang 05/30/2017 7:52 AM

## 2017-05-30 NOTE — Progress Notes (Signed)
CSW made aware that pt is already set up with [facility] services and they have been restarted at this time. There are no further CSW interventions needed at this time. CSW signing off.     Virgie Dad Jaziya Obarr, MSW, [place] [facility] Clinical Social Worker [phone]

## 2017-05-30 NOTE — Discharge Planning (Signed)
Pt currently active with [facility] for RN/PT services.  Resumption of care requested. [facility] [place] notified.  No DME needs identified at this time.

## 2017-05-30 NOTE — Progress Notes (Addendum)
CSW consulted for potential SNF placement. CSW spoke with pt at bedside. Pt appears to be I great sprit and willing to speak with CSW. CSW aware that pt presents with AMS, however pt was able to giver name and correct year, but unable to give correct day of the week. CSW sought very little information from pt considering situation but pt expressed to CSW that if pt had a choice between home and SNF placement pt would like to be home. CSW respect these wishes and will speak with RNCM as well as pt's son. CSW was also informed that MD has placed PT consult to see what pt's needs are. CSW continues to follow for discharge needs at this time.      Virgie Dad Novis League, MSW, [facility] Clinical Social Worker [phone]

## 2017-05-30 NOTE — Discharge Instructions (Signed)
You were evaluated in the emergency department for confusion and weakness.  The medical workup did not show any obvious cause of this.  You ambulated in the department well and were able to return home.  Case management confirm that you have home services coming to your home.  You should follow-up with your doctor and return if any worsening symptoms.

## 2017-05-30 NOTE — Evaluation (Signed)
Physical Therapy Evaluation Patient Details Name: Amy Stein MRN: [id number] DOB: [birth year] Today's Date: 05/30/2017   History of Present Illness  Patient is an 84 y/o female who presented to the ED due to weakness, fatigue, confusion and difficulty walking.  Recently in [facility] 2/13-2/15 due to PNA.  PMH positive for HTN, DM, shingles.  Clinical Impression  Patient presents with decreased mobility due to generalized weakness after recent hospital stay.  Currently safe for d/c home with family support and follow up HHPT (case manager reports already set up from previous hospital stay.)  No further recommendations at this time.  PT to sign off.     Follow Up Recommendations Home health PT(RNCM reports PT already set up)    Equipment Recommendations  None recommended by PT    Recommendations for Other Services       Precautions / Restrictions Precautions Precautions: Fall Precaution Comments: reports no falls at home      Mobility  Bed Mobility Overal bed mobility: Modified Independent                Transfers Overall transfer level: Needs assistance   Transfers: Sit to/from Stand Sit to Stand: Supervision         General transfer comment: assist due to lines, stretcher  Ambulation/Gait Ambulation/Gait assistance: Min assist Ambulation Distance (Feet): 200 Feet Assistive device: 1 person hand held assist Gait Pattern/deviations: Step-through pattern;Decreased stride length     General Gait Details: HHA due to not having her cane, mild unsteadiness, but no difficulty with strides  Stairs            Wheelchair Mobility    Modified Rankin (Stroke Patients Only)       Balance Overall balance assessment: Needs assistance   Sitting balance-Leahy Scale: Good     Standing balance support: No upper extremity supported Standing balance-Leahy Scale: Fair Standing balance comment: static standing unsupported, ambulates with assist                              Pertinent Vitals/Pain Pain Assessment: No/denies pain    Home Living Family/patient expects to be discharged to:: Private residence Living Arrangements: Other relatives(nephew) Available Help at Discharge: Available PRN/intermittently Type of Home: Mobile home Home Access: Stairs to enter Entrance Stairs-Rails: Left;Right Entrance Stairs-Number of Steps: 5 Home Layout: One level Home Equipment: Cane - single point;Walker - 4 wheels;Bedside commode      Prior Function Level of Independence: Independent with assistive device(s)         Comments: cooks, drives but not lately     Hand Dominance        Extremity/Trunk Assessment   Upper Extremity Assessment Upper Extremity Assessment: Overall WFL for tasks assessed    Lower Extremity Assessment Lower Extremity Assessment: Generalized weakness    Cervical / Trunk Assessment Cervical / Trunk Assessment: Kyphotic  Communication   Communication: No difficulties  Cognition Arousal/Alertness: Awake/alert Behavior During Therapy: WFL for tasks assessed/performed Overall Cognitive Status: No family/caregiver present to determine baseline cognitive functioning(alert, oriented to year, city, not to name of hospital or month.)                                        General Comments General comments (skin integrity, edema, etc.): grandson present and reports able to manage at home.  Encouraged pt to  have support in the home when showering, to use walker if feeling unstable and educated on footwear, lighting, and getting up slowly to prevent falls at home.    Exercises     Assessment/Plan    PT Assessment All further PT needs can be met in the next venue of care  PT Problem List         PT Treatment Interventions      PT Goals (Current goals can be found in the Care Plan section)  Acute Rehab PT Goals PT Goal Formulation: All assessment and education complete, DC therapy     Frequency     Barriers to discharge        Co-evaluation               AM-PAC PT "6 Clicks" Daily Activity  Outcome Measure Difficulty turning over in bed (including adjusting bedclothes, sheets and blankets)?: None Difficulty moving from lying on back to sitting on the side of the bed? : A Little Difficulty sitting down on and standing up from a chair with arms (e.g., wheelchair, bedside commode, etc,.)?: A Little Help needed moving to and from a bed to chair (including a wheelchair)?: A Little Help needed walking in hospital room?: A Little Help needed climbing 3-5 steps with a railing? : A Little 6 Click Score: 19    End of Session Equipment Utilized During Treatment: Gait belt Activity Tolerance: Patient tolerated treatment well Patient left: in bed;with family/visitor present Nurse Communication: Other (comment)(no further PT needs) PT Visit Diagnosis: Muscle weakness (generalized) (M62.81);Other abnormalities of gait and mobility (R26.89)    Time: 8549-6565 PT Time Calculation (min) (ACUTE ONLY): 25 min   Charges:   PT Evaluation $PT Eval Low Complexity: 1 Low PT Treatments $Gait Training: 8-22 mins   PT G CodesMagda Kiel, [place] [phone] 05/30/2017   Reginia Naas 05/30/2017, 10:06 AM

## 2017-05-31 ENCOUNTER — Telehealth: Payer: Self-pay | Admitting: *Deleted

## 2017-05-31 LAB — URINE CULTURE: CULTURE: NO GROWTH

## 2017-05-31 NOTE — Telephone Encounter (Signed)
Pt grandson called to clarify if pt needs to discontinue any previous medications.  EDCM reviewed chart and did not find instructions to discontinue any medications.  Advised to continue regular routine.  No further EDCM needs identified.  Amy Stein J. Clydene Laming, [place], [place], [place]

## 2017-06-01 ENCOUNTER — Other Ambulatory Visit (HOSPITAL_COMMUNITY): Payer: Self-pay | Admitting: Internal Medicine

## 2017-06-01 ENCOUNTER — Ambulatory Visit (HOSPITAL_COMMUNITY)
Admission: RE | Admit: 2017-06-01 | Discharge: 2017-06-01 | Disposition: A | Discharge status: Home / Self Care | Payer: Medicare Other | Source: Ambulatory Visit | Attending: Internal Medicine | Admitting: Internal Medicine

## 2017-06-01 DIAGNOSIS — I700 Atherosclerosis of aorta: Secondary | ICD-10-CM | POA: Diagnosis not present

## 2017-06-01 DIAGNOSIS — R918 Other nonspecific abnormal finding of lung field: Secondary | ICD-10-CM | POA: Diagnosis not present

## 2017-06-01 DIAGNOSIS — J181 Lobar pneumonia, unspecified organism: Secondary | ICD-10-CM | POA: Diagnosis not present

## 2017-06-01 DIAGNOSIS — M1711 Unilateral primary osteoarthritis, right knee: Secondary | ICD-10-CM | POA: Insufficient documentation

## 2017-06-01 DIAGNOSIS — J189 Pneumonia, unspecified organism: Secondary | ICD-10-CM

## 2017-06-03 NOTE — ED Notes (Addendum)
Spoke to Amy Stein who gave permission for grandson, Amy Stein to have a work note from her visit on Sunday. Notes reflect grandson was present with his grandmother for he visit.

## 2017-06-09 ENCOUNTER — Other Ambulatory Visit: Payer: Medicare Other | Admitting: Adult Health

## 2017-06-10 ENCOUNTER — Other Ambulatory Visit: Payer: Medicare Other | Admitting: Adult Health

## 2017-06-29 ENCOUNTER — Encounter: Payer: Self-pay | Admitting: Adult Health

## 2017-06-29 ENCOUNTER — Other Ambulatory Visit: Payer: Medicare Other | Admitting: Adult Health

## 2017-07-09 DIAGNOSIS — I208 Other forms of angina pectoris: Secondary | ICD-10-CM | POA: Insufficient documentation

## 2017-12-01 ENCOUNTER — Encounter: Payer: Self-pay | Admitting: "Endocrinology

## 2017-12-01 ENCOUNTER — Ambulatory Visit (INDEPENDENT_AMBULATORY_CARE_PROVIDER_SITE_OTHER): Payer: 59 | Admitting: "Endocrinology

## 2017-12-01 VITALS — BP 132/77 | HR 76 | Ht 62.0 in | Wt 154.0 lb

## 2017-12-01 DIAGNOSIS — I10 Essential (primary) hypertension: Secondary | ICD-10-CM

## 2017-12-01 DIAGNOSIS — N183 Chronic kidney disease, stage 3 (moderate): Secondary | ICD-10-CM | POA: Diagnosis not present

## 2017-12-01 DIAGNOSIS — E1122 Type 2 diabetes mellitus with diabetic chronic kidney disease: Secondary | ICD-10-CM

## 2017-12-01 DIAGNOSIS — E782 Mixed hyperlipidemia: Secondary | ICD-10-CM | POA: Diagnosis not present

## 2017-12-01 DIAGNOSIS — Z794 Long term (current) use of insulin: Secondary | ICD-10-CM

## 2017-12-01 MED ORDER — INSULIN GLARGINE 100 UNIT/ML SOLOSTAR PEN: 30.0000 [IU] | PEN_INJECTOR | Freq: Every day | SUBCUTANEOUS | 2 refills | Status: DC

## 2017-12-01 MED ORDER — INSULIN PEN NEEDLE 31G X 8 MM MISC: 1.0000 | 3 refills | Status: DC

## 2017-12-01 NOTE — Progress Notes (Signed)
Endocrinology Consult Note       12/01/2017, 1:29 PM   Subjective:    Patient ID: Amy Stein, female    DOB: [birth year].  [name] is being seen in consultation for management of currently uncontrolled symptomatic diabetes requested by  Rosita Fire, MD.   Past Medical History:  Diagnosis Date  . Diabetes mellitus   . Hypertension   . Shingles    on both legs 1/18   Past Surgical History:  Procedure Laterality Date  . COLONOSCOPY    . COLONOSCOPY N/A 02/23/2017   Procedure: COLONOSCOPY;  Surgeon: Daneil Dolin, MD;  [facility];  Service: Endoscopy;  Laterality: N/A;  9:30 Am  . POLYPECTOMY  02/23/2017   Procedure: POLYPECTOMY;  Surgeon: Daneil Dolin, MD;  Location: [facility];  Service: Endoscopy;;  cecum   Social History   Socioeconomic History  . Marital status: Single    Spouse name: Not on file  . Number of children: Not on file  . Years of education: Not on file  . Highest education level: Not on file  Occupational History  . Not on file  Social Needs  . Financial resource strain: Not on file  . Food insecurity:    Worry: Not on file    Inability: Not on file  . Transportation needs:    Medical: Not on file    Non-medical: Not on file  Tobacco Use  . Smoking status: Former Research scientist (life sciences)  . Smokeless tobacco: Never Used  Substance and Sexual Activity  . Alcohol use: No  . Drug use: No  . Sexual activity: Yes    Birth control/protection: Post-menopausal  Lifestyle  . Physical activity:    Days per week: Not on file    Minutes per session: Not on file  . Stress: Not on file  Relationships  . Social connections:    Talks on phone: Not on file    Gets together: Not on file    Attends religious service: Not on file    Active member of club or organization: Not on file    Attends meetings of clubs or organizations: Not on file    Relationship status: Not  on file  Other Topics Concern  . Not on file  Social History Narrative  . Not on file   Outpatient Encounter Medications as of 12/01/2017  Medication Sig  . aspirin EC 81 MG tablet Take 81 mg by mouth daily.  . ferrous sulfate 325 (65 FE) MG tablet Take 1 tablet (325 mg total) by mouth 2 (two) times daily with a meal.  . gabapentin (NEURONTIN) 400 MG capsule Take 400 mg by mouth 3 (three) times daily.   . hydrochlorothiazide (MICROZIDE) 12.5 MG capsule Take 12.5 mg by mouth daily.  . Insulin Glargine (LANTUS SOLOSTAR) 100 UNIT/ML Solostar Pen Inject 30 Units into the skin at bedtime.  . Insulin Pen Needle (B-D ULTRAFINE III SHORT PEN) 31G X 8 MM MISC 1 each by Does not apply route as directed.  . labetalol (NORMODYNE) 100 MG tablet Take 100 mg by mouth 2 (two) times daily.   Marland Kitchen  losartan (COZAAR) 100 MG tablet Take 100 mg by mouth daily.  . medroxyPROGESTERone (PROVERA) 10 MG tablet Take 10 mg See admin instructions by mouth. Take 10 mg by mouth daily for the first 10 days of the month  . omeprazole (PRILOSEC) 20 MG capsule Take 20 mg by mouth daily.  Marland Kitchen oxybutynin (DITROPAN) 5 MG tablet Take 5 mg by mouth daily.  . simvastatin (ZOCOR) 40 MG tablet Take 40 mg by mouth at bedtime.   . vitamin B-12 1000 MCG tablet Take 1 tablet (1,000 mcg total) by mouth daily.  . [DISCONTINUED] insulin glargine (LANTUS) 100 unit/mL SOPN Inject 0.15 mLs (15 Units total) into the skin at bedtime. (Patient taking differently: Inject 35 Units into the skin at bedtime. )   No facility-administered encounter medications on file as of 12/01/2017.     ALLERGIES: Allergies  Allergen Reactions  . Sulfonamide Derivatives Rash    VACCINATION STATUS:  There is no immunization history on file for this patient.  Diabetes  She presents for her initial diabetic visit. She has type 2 diabetes mellitus. Onset time: She was diagnosed at approximate age of 62 years. Her disease course has been stable. There are no  hypoglycemic associated symptoms. Pertinent negatives for hypoglycemia include no confusion, headaches, pallor or seizures. There are no diabetic associated symptoms. Pertinent negatives for diabetes include no chest pain, no polydipsia, no polyphagia and no polyuria. There are no hypoglycemic complications. Symptoms are stable. Diabetic complications include nephropathy. Risk factors for coronary artery disease include diabetes mellitus, hypertension, sedentary lifestyle and tobacco exposure. Current diabetic treatment includes insulin injections. Her weight is stable. She is following a generally unhealthy diet. When asked about meal planning, she reported none. She has not had a previous visit with a dietitian. She rarely participates in exercise. Her home blood glucose trend is fluctuating minimally. Her breakfast blood glucose range is generally 140-180 mg/dl. Her bedtime blood glucose range is generally 180-200 mg/dl. Her overall blood glucose range is 180-200 mg/dl. An ACE inhibitor/angiotensin II receptor blocker is being taken. Eye exam is current.  Hypertension  This is a chronic problem. The current episode started more than 1 year ago. The problem is controlled. Pertinent negatives include no chest pain, headaches, palpitations or shortness of breath. Risk factors for coronary artery disease include diabetes mellitus, sedentary lifestyle, smoking/tobacco exposure and post-menopausal state. Past treatments include angiotensin blockers. Hypertensive end-organ damage includes kidney disease. Identifiable causes of hypertension include chronic renal disease.      Review of Systems  Constitutional: Negative for chills, fever and unexpected weight change.  HENT: Negative for trouble swallowing and voice change.   Eyes: Negative for visual disturbance.  Respiratory: Negative for cough, shortness of breath and wheezing.   Cardiovascular: Negative for chest pain, palpitations and leg swelling.   Gastrointestinal: Negative for diarrhea, nausea and vomiting.  Endocrine: Negative for cold intolerance, heat intolerance, polydipsia, polyphagia and polyuria.  Musculoskeletal: Negative for arthralgias and myalgias.  Skin: Negative for color change, pallor, rash and wound.  Neurological: Negative for seizures and headaches.  Psychiatric/Behavioral: Negative for confusion and suicidal ideas.    Objective:    BP 132/77   Pulse 76   Ht 5\' 2"  (1.575 m)   Wt 154 lb (69.9 kg)   BMI 28.17 kg/m   Wt Readings from Last 3 Encounters:  12/01/17 154 lb (69.9 kg)  05/29/17 156 lb (70.8 kg)  05/25/17 163 lb 5.8 oz (74.1 kg)     Physical Exam  Constitutional:  She is oriented to person, place, and time. She appears well-developed.  HENT:  Head: Normocephalic and atraumatic.  Eyes: EOM are normal.  Neck: Normal range of motion. Neck supple. No tracheal deviation present. No thyromegaly present.  Cardiovascular: Normal rate and regular rhythm.  Pulmonary/Chest: Effort normal and breath sounds normal.  Abdominal: Soft. Bowel sounds are normal. There is no tenderness. There is no guarding.  Musculoskeletal: Normal range of motion. She exhibits no edema.  Neurological: She is alert and oriented to person, place, and time. She has normal reflexes. No cranial nerve deficit. Coordination normal.  Skin: Skin is warm and dry. No rash noted. No erythema. No pallor.  Psychiatric: She has a normal mood and affect. Judgment normal.    CMP     Component Value Date/Time   NA 136 05/29/2017 2044   K 3.5 05/29/2017 2044   CL 102 05/29/2017 2044   CO2 20 (L) 05/29/2017 2044   GLUCOSE 139 (H) 05/29/2017 2044   BUN 23 (H) 05/29/2017 2044   CREATININE 1.53 (H) 05/29/2017 2044   CALCIUM 9.3 05/29/2017 2044   PROT 7.9 05/29/2017 2044   ALBUMIN 3.4 (L) 05/29/2017 2044   AST 35 05/29/2017 2044   ALT 29 05/29/2017 2044   ALKPHOS 75 05/29/2017 2044   BILITOT 0.6 05/29/2017 2044   GFRNONAA 31 (L)  05/29/2017 2044   GFRAA 36 (L) 05/29/2017 2044    Diabetic Labs (most recent): Lab Results  Component Value Date   HGBA1C 7.6 (H) 05/26/2017      Lab Results  Component Value Date   TSH 0.793 05/26/2017      Assessment & Plan:   1. Type 2 diabetes mellitus with stage 3 chronic kidney disease, with long-term current use of insulin ([place])  - Aletha Halim Duarte has currently uncontrolled symptomatic type 2 DM since 84 years of age,  with most recent A1c of 7.6 %. Recent labs reviewed.  -her diabetes is complicated by stage 3 renal insufficiency and GENISIS SONNIER remains at a high risk for more acute and chronic complications which include CAD, CVA, CKD, retinopathy, and neuropathy. These are all discussed in detail with the patient.  - I have counseled her on diet management by adopting a carbohydrate restricted/protein rich diet.  - Suggestion is made for her to avoid simple carbohydrates  from her diet including Cakes, Sweet Desserts, Ice Cream, Soda (diet and regular), Sweet Tea, Candies, Chips, Cookies, Store Bought Juices, Alcohol in Excess of  1-2 drinks a day, Artificial Sweeteners, and "Sugar-free" Products. This will help patient to have stable blood glucose profile and potentially avoid unintended weight gain.  - I encouraged her to switch to  unprocessed or minimally processed complex starch and increased protein intake (animal or plant source), fruits, and vegetables.  - she is advised to stick to a routine mealtimes to eat 3 meals  a day and avoid unnecessary snacks ( to snack only to correct hypoglycemia).   - she will be scheduled with Jearld Fenton, RDN, CDE for individualized diabetes education.  - I have approached her with the following individualized plan to manage diabetes and patient agrees:   -She is elderly patient with several comorbidities, #1 priority and treating her diabetes would be to avoid hypoglycemia.    -Based on her presentation with A1c of 7.6%, she  would not be considered for prandial insulin.  I discussed and lowered her Lantus to 30 units nightly associated with monitoring of blood glucose 2 times a  day-daily before breakfast and at bedtime and return in 27-month with her logs, meter, and new labs.    -Patient is encouraged to call clinic for blood glucose levels less than 70 or above 300 mg /dl.  -She is not a candidate for metformin, SGLT2 inhibitors, nor incretin therapy.  - Patient specific target  A1c;  LDL, HDL, Triglycerides, and  Waist Circumference were discussed in detail.  2) BP/HTN: Her blood pressure is controlled to target.  Advised to continue current medications including losartan . 3) Lipids/HPL:   She has no recent lipid panel to review.  She is advised to continue her simvastatin 40 mg p.o. Nightly.  4)  Weight/Diet: CDE Consult will be initiated , exercise, and detailed carbohydrates information provided.  5) Chronic Care/Health Maintenance:  -she  is on ACEI/ARB and Statin medications and  is encouraged to initiate and continue to follow up with Ophthalmology, Dentist,  Podiatrist at least yearly or according to recommendations, and advised to  stay away from smoking. I have recommended yearly flu vaccine and pneumonia vaccine at least every 5 years; moderate intensity exercise for up to 150 minutes weekly; and  sleep for at least 7 hours a day.  - I advised patient to maintain close follow up with Rosita Fire, MD for primary care needs.  - Time spent with the patient: 45 minutes, of which >50% was spent in obtaining information about her symptoms, reviewing her previous labs, evaluations, and treatments, counseling her about her currently uncontrolled and complicated type 2 diabetes, hyperlipidemia, hypertension, and developing developing  plans for long term treatment based on the latest recommendations.  [name] participated in the discussions, expressed understanding, and voiced agreement with the above  plans.  All questions were answered to her satisfaction. she is encouraged to contact clinic should she have any questions or concerns prior to her return visit.  Follow up plan: - Return in about 8 weeks (around 01/26/2018) for Follow up with Pre-visit Labs, Meter, and Logs.  Glade Lloyd, MD [facility] [address] [place], [place] [address] Phone: [phone]  Fax: [phone]    12/01/2017, 1:29 PM  This note was partially dictated with voice recognition software. Similar sounding words can be transcribed inadequately or may not  be corrected upon review.

## 2017-12-01 NOTE — Patient Instructions (Signed)

## 2017-12-05 ENCOUNTER — Telehealth: Payer: Self-pay | Admitting: "Endocrinology

## 2017-12-05 NOTE — Telephone Encounter (Signed)
Amy Stein is stating that she picked up the Lantus and Needles that Dr. Dorris Fetch called in for her at the pharmacy and the needles are to big, please advise?

## 2017-12-06 MED ORDER — PEN NEEDLES 32G X 4 MM MISC: 1.0000 | Freq: Every day | 3 refills | Status: DC

## 2017-12-20 ENCOUNTER — Other Ambulatory Visit: Payer: 59 | Admitting: Adult Health

## 2017-12-20 ENCOUNTER — Encounter: Payer: Self-pay | Admitting: *Deleted

## 2017-12-27 ENCOUNTER — Other Ambulatory Visit (HOSPITAL_COMMUNITY): Payer: Self-pay | Admitting: Internal Medicine

## 2017-12-27 DIAGNOSIS — Z1231 Encounter for screening mammogram for malignant neoplasm of breast: Secondary | ICD-10-CM

## 2017-12-29 ENCOUNTER — Ambulatory Visit (HOSPITAL_COMMUNITY)
Admission: RE | Admit: 2017-12-29 | Discharge: 2017-12-29 | Disposition: A | Discharge status: Home / Self Care | Payer: 59 | Source: Ambulatory Visit | Attending: Internal Medicine | Admitting: Internal Medicine

## 2017-12-29 DIAGNOSIS — Z1231 Encounter for screening mammogram for malignant neoplasm of breast: Secondary | ICD-10-CM | POA: Insufficient documentation

## 2018-01-16 ENCOUNTER — Ambulatory Visit (INDEPENDENT_AMBULATORY_CARE_PROVIDER_SITE_OTHER): Payer: 59 | Admitting: Adult Health

## 2018-01-16 ENCOUNTER — Other Ambulatory Visit (HOSPITAL_COMMUNITY)
Admission: RE | Admit: 2018-01-16 | Discharge: 2018-01-16 | Disposition: A | Discharge status: Home / Self Care | Payer: 59 | Source: Ambulatory Visit | Attending: Adult Health | Admitting: Adult Health

## 2018-01-16 ENCOUNTER — Other Ambulatory Visit: Payer: Self-pay

## 2018-01-16 ENCOUNTER — Encounter: Payer: Self-pay | Admitting: Adult Health

## 2018-01-16 VITALS — BP 123/79 | HR 77 | Ht 62.0 in | Wt 154.0 lb

## 2018-01-16 DIAGNOSIS — Z1211 Encounter for screening for malignant neoplasm of colon: Secondary | ICD-10-CM | POA: Diagnosis not present

## 2018-01-16 DIAGNOSIS — Z1212 Encounter for screening for malignant neoplasm of rectum: Secondary | ICD-10-CM

## 2018-01-16 DIAGNOSIS — Z01419 Encounter for gynecological examination (general) (routine) without abnormal findings: Secondary | ICD-10-CM

## 2018-01-16 LAB — HEMOCCULT GUIAC POC 1CARD (OFFICE): Fecal Occult Blood, POC: NEGATIVE

## 2018-01-16 MED ORDER — MEDROXYPROGESTERONE ACETATE 10 MG PO TABS: 10.0000 mg | ORAL_TABLET | ORAL | 4 refills | Status: DC

## 2018-01-16 NOTE — Progress Notes (Signed)
Patient ID: Amy Stein, female   DOB: 11-Jul-1933, 84 y.o.   MRN: [id number] History of Present Illness: Amy Stein is a 84 year old black female, single, with a friend(who is older) in for a well woman gyn exam and she requests a pap. PCP is Dr Legrand Rams.   Current Medications, Allergies, Past Medical History, Past Surgical History, Family History and Social History were reviewed in [employer] record.     Review of Systems: Patient denies any headaches, hearing loss, fatigue, blurred vision, shortness of breath, chest pain, abdominal pain, problems with bowel movements, urination,(has to go often) or intercourse. No joint pain or mood swings. No vaginal bleeding.   Physical Exam:BP 123/79 (BP Location: Left Arm, Patient Position: Sitting, Cuff Size: Normal)   Pulse 77   Ht 5\' 2"  (1.575 m)   Wt 154 lb (69.9 kg)   BMI 28.17 kg/m  General:  Well developed, well nourished, no acute distress Skin:  Warm and dry Neck:  Midline trachea, normal thyroid, good ROM, no lymphadenopathy,no carotid bruits heard. Lungs; Clear to auscultation bilaterally Breast:  No dominant palpable mass, retraction, or nipple discharge Cardiovascular: Regular rate and rhythm Abdomen:  Soft, non tender, no hepatosplenomegaly Pelvic:  External genitalia is normal in appearance, no lesions.  The vagina is pink with fairly good moisture and loss of rugae.Marland Kitchen Urethra has no lesions or masses. The cervix is smooth, pap with HPV performed.  Uterus is felt to be normal size, shape, and contour.  No adnexal masses or tenderness noted.Bladder is non tender, no masses felt. Rectal: Good sphincter tone, no polyps, or hemorrhoids felt.  Hemoccult negative. Extremities/musculoskeletal:  No swelling or varicosities noted, no clubbing or cyanosis Psych:  No mood changes, alert and cooperative,seems happy PHQ 2 score 0. Examination chaperoned by Estill Bamberg Rash LPN.  Impression: 1. Encounter for gynecological examination  with Papanicolaou smear of cervix   2. Screening for colorectal cancer       Plan: Physical in 1 year Mammogram yearly Labs with PCP Meds ordered this encounter  Medications  . medroxyPROGESTERone (PROVERA) 10 MG tablet    Sig: Take 1 tablet (10 mg total) by mouth See admin instructions. Take 10 mg by mouth daily for the first 10 days of the month    Dispense:  30 tablet    Refill:  4    Order Specific Question:   Supervising Provider    Answer:   Florian Buff [[id number]]  Colonoscopy per GI

## 2018-01-17 LAB — CYTOLOGY - PAP
Diagnosis: NEGATIVE
HPV: NOT DETECTED

## 2018-01-25 NOTE — Congregational Nurse Program (Signed)
  Dept: [phone]   Congregational Nurse Program Note  Date of Encounter: 01/25/2018  Past Medical History: Past Medical History:  Diagnosis Date  . Diabetes mellitus   . Hypertension   . Shingles    on both legs 1/18    Encounter Details: CNP Questionnaire - 01/25/18 1136      Questionnaire   Patient Status  Not Applicable    Race  Black or African American    Location Patient [place], [employer] Insurance;Medicare    Uninsured  Not Applicable    Food  Yes, have food insecurities    Housing/Utilities  Yes, have permanent housing    Transportation  No transportation needs    Interpersonal Safety  Yes, feel physically and emotionally safe where you currently live    Medication  No medication insecurities    Medical Provider  Yes    Referrals  Not Applicable    ED Visit Averted  Not Applicable    Life-Saving Intervention Made  Not Applicable     Client was assisted with BP check today, 121/67 P70. Client has PCP. Questions about diet and controlling blood sugar. Diabetic teaching offered and was given informtion about Diabetic classes offered at [facility] by diabetic educator, was given card with dates and times of diabetic classes and nutrition classes. Also client was interested in [place] and was given phone number to call for more information.  Also give reading glasses.  Amy Stein  [phone].

## 2018-02-01 ENCOUNTER — Encounter: Payer: Self-pay | Admitting: "Endocrinology

## 2018-02-01 ENCOUNTER — Other Ambulatory Visit: Payer: Self-pay | Admitting: "Endocrinology

## 2018-02-01 ENCOUNTER — Ambulatory Visit (INDEPENDENT_AMBULATORY_CARE_PROVIDER_SITE_OTHER): Payer: 59 | Admitting: "Endocrinology

## 2018-02-01 VITALS — BP 139/80 | HR 79 | Ht 62.0 in | Wt 151.0 lb

## 2018-02-01 DIAGNOSIS — E1122 Type 2 diabetes mellitus with diabetic chronic kidney disease: Secondary | ICD-10-CM | POA: Diagnosis not present

## 2018-02-01 DIAGNOSIS — E559 Vitamin D deficiency, unspecified: Secondary | ICD-10-CM | POA: Insufficient documentation

## 2018-02-01 DIAGNOSIS — N183 Chronic kidney disease, stage 3 unspecified: Secondary | ICD-10-CM

## 2018-02-01 DIAGNOSIS — Z794 Long term (current) use of insulin: Secondary | ICD-10-CM

## 2018-02-01 DIAGNOSIS — I10 Essential (primary) hypertension: Secondary | ICD-10-CM | POA: Diagnosis not present

## 2018-02-01 DIAGNOSIS — E782 Mixed hyperlipidemia: Secondary | ICD-10-CM

## 2018-02-01 LAB — HEMOGLOBIN A1C
EAG (MMOL/L): 11.1 (calc)
Hgb A1c MFr Bld: 8.6 % of total Hgb — ABNORMAL HIGH (ref <5.7)
Mean Plasma Glucose: 200 (calc)

## 2018-02-01 LAB — LIPID PANEL
CHOL/HDL RATIO: 3.4 (calc) (ref <5.0)
CHOLESTEROL: 134 mg/dL (ref <200)
HDL: 39 mg/dL — AB (ref >50)
LDL Cholesterol (Calc): 75 mg/dL (calc)
Non-HDL Cholesterol (Calc): 95 mg/dL (calc) (ref <130)
Triglycerides: 115 mg/dL (ref <150)

## 2018-02-01 LAB — COMPLETE METABOLIC PANEL WITH GFR
AG RATIO: 1.4 (calc) (ref 1.0–2.5)
ALBUMIN MSPROF: 4.2 g/dL (ref 3.6–5.1)
ALKALINE PHOSPHATASE (APISO): 80 U/L (ref 33–130)
ALT: 15 U/L (ref 6–29)
AST: 21 U/L (ref 10–35)
BILIRUBIN TOTAL: 0.4 mg/dL (ref 0.2–1.2)
BUN/Creatinine Ratio: 20 (calc) (ref 6–22)
BUN: 32 mg/dL — AB (ref 7–25)
CALCIUM: 9.5 mg/dL (ref 8.6–10.4)
CO2: 31 mmol/L (ref 20–32)
CREATININE: 1.59 mg/dL — AB (ref 0.60–0.88)
Chloride: 103 mmol/L (ref 98–110)
GFR, Est African American: 35 mL/min/{1.73_m2} — ABNORMAL LOW (ref >=60)
GFR, Est Non African American: 30 mL/min/{1.73_m2} — ABNORMAL LOW (ref >=60)
GLOBULIN: 3 g/dL (ref 1.9–3.7)
Glucose, Bld: 130 mg/dL — ABNORMAL HIGH (ref 65–99)
Potassium: 3.7 mmol/L (ref 3.5–5.3)
Sodium: 141 mmol/L (ref 135–146)
TOTAL PROTEIN: 7.2 g/dL (ref 6.1–8.1)

## 2018-02-01 LAB — MICROALBUMIN / CREATININE URINE RATIO
CREATININE, URINE: 51 mg/dL (ref 20–275)
MICROALB UR: 0.6 mg/dL
MICROALB/CREAT RATIO: 12 ug/mg{creat} (ref <30)

## 2018-02-01 LAB — VITAMIN D 25 HYDROXY (VIT D DEFICIENCY, FRACTURES): Vit D, 25-Hydroxy: 12 ng/mL — ABNORMAL LOW (ref 30–100)

## 2018-02-01 LAB — T4, FREE: Free T4: 0.9 ng/dL (ref 0.8–1.8)

## 2018-02-01 LAB — TSH: TSH: 1.29 mIU/L (ref 0.40–4.50)

## 2018-02-01 MED ORDER — VITAMIN D3 125 MCG (5000 UT) PO CAPS: 5000.0000 [IU] | ORAL_CAPSULE | Freq: Every day | ORAL | 0 refills | Status: DC

## 2018-02-01 MED ORDER — INSULIN GLARGINE 100 UNIT/ML SOLOSTAR PEN: 35.0000 [IU] | PEN_INJECTOR | Freq: Every day | SUBCUTANEOUS | 2 refills | Status: DC

## 2018-02-01 NOTE — Patient Instructions (Signed)

## 2018-02-01 NOTE — Progress Notes (Addendum)
Endocrinology follow-up  Note       02/01/2018, 11:51 AM   Subjective:    Patient ID: Amy Stein, female    DOB: May 11, 1933.  Amy Stein is being seen in follow-up  for management of currently uncontrolled symptomatic type 2 diabetes, hypertension, hyperlipidemia. PMD:   Rosita Fire, MD.   Past Medical History:  Diagnosis Date  . Diabetes mellitus   . Hypertension   . Shingles    on both legs 1/18   Past Surgical History:  Procedure Laterality Date  . COLONOSCOPY    . COLONOSCOPY N/A 02/23/2017   Procedure: COLONOSCOPY;  Surgeon: Daneil Dolin, MD;  Location: [facility];  Service: Endoscopy;  Laterality: N/A;  9:30 Am  . POLYPECTOMY  02/23/2017   Procedure: POLYPECTOMY;  Surgeon: Daneil Dolin, MD;  Location: [facility];  Service: Endoscopy;;  cecum   Social History   Socioeconomic History  . Marital status: Single    Spouse name: Not on file  . Number of children: Not on file  . Years of education: Not on file  . Highest education level: Not on file  Occupational History  . Not on file  Social Needs  . Financial resource strain: Not on file  . Food insecurity:    Worry: Not on file    Inability: Not on file  . Transportation needs:    Medical: Not on file    Non-medical: Not on file  Tobacco Use  . Smoking status: Former Research scientist (life sciences)  . Smokeless tobacco: Never Used  Substance and Sexual Activity  . Alcohol use: No  . Drug use: No  . Sexual activity: Yes    Birth control/protection: Post-menopausal  Lifestyle  . Physical activity:    Days per week: Not on file    Minutes per session: Not on file  . Stress: Not on file  Relationships  . Social connections:    Talks on phone: Not on file    Gets together: Not on file    Attends religious service: Not on file    Active member of club or organization: Not on file    Attends meetings of clubs or organizations: Not on  file    Relationship status: Not on file  Other Topics Concern  . Not on file  Social History Narrative  . Not on file   Outpatient Encounter Medications as of 02/01/2018  Medication Sig  . aspirin EC 81 MG tablet Take 81 mg by mouth daily.  . ferrous sulfate 325 (65 FE) MG tablet Take 1 tablet (325 mg total) by mouth 2 (two) times daily with a meal.  . gabapentin (NEURONTIN) 400 MG capsule Take 400 mg by mouth 3 (three) times daily.   . hydrochlorothiazide (MICROZIDE) 12.5 MG capsule Take 12.5 mg by mouth daily.  . Insulin Glargine (LANTUS SOLOSTAR) 100 UNIT/ML Solostar Pen Inject 35 Units into the skin at bedtime.  . Insulin Pen Needle (PEN NEEDLES) 32G X 4 MM MISC 1 each by Does not apply route at bedtime.  Marland Kitchen labetalol (NORMODYNE) 100 MG tablet Take 100 mg by mouth 2 (two) times  daily.   . losartan (COZAAR) 100 MG tablet Take 100 mg by mouth daily.  . medroxyPROGESTERone (PROVERA) 10 MG tablet Take 1 tablet (10 mg total) by mouth See admin instructions. Take 10 mg by mouth daily for the first 10 days of the month  . omeprazole (PRILOSEC) 20 MG capsule Take 20 mg by mouth daily.  Marland Kitchen oxybutynin (DITROPAN) 5 MG tablet Take 5 mg by mouth daily.  . simvastatin (ZOCOR) 40 MG tablet Take 40 mg by mouth at bedtime.   . vitamin B-12 1000 MCG tablet Take 1 tablet (1,000 mcg total) by mouth daily.  . [DISCONTINUED] Insulin Glargine (LANTUS SOLOSTAR) 100 UNIT/ML Solostar Pen Inject 30 Units into the skin at bedtime.   No facility-administered encounter medications on file as of 02/01/2018.     ALLERGIES: Allergies  Allergen Reactions  . Sulfonamide Derivatives Rash    VACCINATION STATUS:  There is no immunization history on file for this patient.  Diabetes  She presents for her follow-up diabetic visit. She has type 2 diabetes mellitus. Onset time: She was diagnosed at approximate age of 61 years. Her disease course has been worsening. There are no hypoglycemic associated symptoms.  Pertinent negatives for hypoglycemia include no confusion, headaches, pallor or seizures. There are no diabetic associated symptoms. Pertinent negatives for diabetes include no chest pain, no polydipsia, no polyphagia and no polyuria. There are no hypoglycemic complications. Symptoms are worsening. Diabetic complications include nephropathy. Risk factors for coronary artery disease include diabetes mellitus, hypertension, sedentary lifestyle and tobacco exposure. Current diabetic treatment includes insulin injections. Her weight is fluctuating minimally. She is following a generally unhealthy diet. When asked about meal planning, she reported none. She has not had a previous visit with a dietitian. She rarely participates in exercise. Her home blood glucose trend is fluctuating minimally. (She comes in with no meter nor logs.  Her previsit labs show A1c of 8.6% increasing from 7.6%.) An ACE inhibitor/angiotensin II receptor blocker is being taken. Eye exam is current.  Hypertension  This is a chronic problem. The current episode started more than 1 year ago. The problem is controlled. Pertinent negatives include no chest pain, headaches, palpitations or shortness of breath. Risk factors for coronary artery disease include diabetes mellitus, sedentary lifestyle, smoking/tobacco exposure and post-menopausal state. Past treatments include angiotensin blockers. Hypertensive end-organ damage includes kidney disease. Identifiable causes of hypertension include chronic renal disease.    Review of Systems  Constitutional: Negative for chills, fever and unexpected weight change.  HENT: Negative for trouble swallowing and voice change.   Eyes: Negative for visual disturbance.  Respiratory: Negative for cough, shortness of breath and wheezing.   Cardiovascular: Negative for chest pain, palpitations and leg swelling.  Gastrointestinal: Negative for diarrhea, nausea and vomiting.  Endocrine: Negative for cold  intolerance, heat intolerance, polydipsia, polyphagia and polyuria.  Musculoskeletal: Negative for arthralgias and myalgias.  Skin: Negative for color change, pallor, rash and wound.  Neurological: Negative for seizures and headaches.  Psychiatric/Behavioral: Negative for confusion and suicidal ideas.    Objective:    BP 139/80   Pulse 79   Ht 5\' 2"  (1.575 m)   Wt 151 lb (68.5 kg)   BMI 27.62 kg/m   Wt Readings from Last 3 Encounters:  02/01/18 151 lb (68.5 kg)  01/16/18 154 lb (69.9 kg)  12/01/17 154 lb (69.9 kg)     Physical Exam  Constitutional: She is oriented to person, place, and time. She appears well-developed.  HENT:  Head: Normocephalic  and atraumatic.  Eyes: EOM are normal.  Neck: Normal range of motion. Neck supple. No tracheal deviation present. No thyromegaly present.  Cardiovascular: Normal rate and regular rhythm.  Pulmonary/Chest: Effort normal and breath sounds normal.  Abdominal: Soft. Bowel sounds are normal. There is no tenderness. There is no guarding.  Musculoskeletal: Normal range of motion. She exhibits no edema.  Neurological: She is alert and oriented to person, place, and time. She has normal reflexes. No cranial nerve deficit. Coordination normal.  Skin: Skin is warm and dry. No rash noted. No erythema. No pallor.  Psychiatric: She has a normal mood and affect. Judgment normal.    CMP     Component Value Date/Time   NA 141 01/31/2018 0941   K 3.7 01/31/2018 0941   CL 103 01/31/2018 0941   CO2 31 01/31/2018 0941   GLUCOSE 130 (H) 01/31/2018 0941   BUN 32 (H) 01/31/2018 0941   CREATININE 1.59 (H) 01/31/2018 0941   CALCIUM 9.5 01/31/2018 0941   PROT 7.2 01/31/2018 0941   ALBUMIN 3.4 (L) 05/29/2017 2044   AST 21 01/31/2018 0941   ALT 15 01/31/2018 0941   ALKPHOS 75 05/29/2017 2044   BILITOT 0.4 01/31/2018 0941   GFRNONAA 30 (L) 01/31/2018 0941   GFRAA 35 (L) 01/31/2018 0941    Diabetic Labs (most recent): Lab Results  Component Value  Date   HGBA1C 8.6 (H) 01/31/2018   HGBA1C 7.6 (H) 05/26/2017      Lab Results  Component Value Date   TSH 1.29 01/31/2018   TSH 0.793 05/26/2017   FREET4 0.9 01/31/2018   Lipid Panel     Component Value Date/Time   CHOL 134 01/31/2018 0941   TRIG 115 01/31/2018 0941   HDL 39 (L) 01/31/2018 0941   CHOLHDL 3.4 01/31/2018 0941   LDLCALC 75 01/31/2018 0941      Assessment & Plan:   1. Type 2 diabetes mellitus with stage 3 chronic kidney disease, with long-term current use of insulin ([place])  - Aletha Halim Rorke has currently uncontrolled symptomatic type 2 DM since 67 years of ago. -She did not bring any meter nor logs to review.  Her previsit labs show A1c of 8.6% increasing from 7.6%.  - Recent labs reviewed.  -her diabetes is complicated by stage 3 renal insufficiency and EMMALI KAROW remains at a high risk for more acute and chronic complications which include CAD, CVA, CKD, retinopathy, and neuropathy. These are all discussed in detail with the patient.  - I have counseled her on diet management by adopting a carbohydrate restricted/protein rich diet.  -  Suggestion is made for her to avoid simple carbohydrates  from her diet including Cakes, Sweet Desserts / Pastries, Ice Cream, Soda (diet and regular), Sweet Tea, Candies, Chips, Cookies, Store Bought Juices, Alcohol in Excess of  1-2 drinks a day, Artificial Sweeteners, and "Sugar-free" Products. This will help patient to have stable blood glucose profile and potentially avoid unintended weight gain.   - I encouraged her to switch to  unprocessed or minimally processed complex starch and increased protein intake (animal or plant source), fruits, and vegetables.  - she is advised to stick to a routine mealtimes to eat 3 meals  a day and avoid unnecessary snacks ( to snack only to correct hypoglycemia).   - I have approached her with the following individualized plan to manage diabetes and patient agrees:   -She is elderly  patient with several comorbidities, #1 priority and treating her  diabetes would be to avoid hypoglycemia.    -She has questionable compliance for monitoring, promises that she will drop of her readings.   -Given her rising A1c of 8.6% from 7.6%, I discussed and increased her Lantus to 35 units nightly associated with strict monitoring of blood glucose at least twice a day-daily before breakfast and at bedtime.     -Patient is encouraged to call clinic for blood glucose levels less than 70 or above 300 mg /dl.  -She is not a candidate for metformin, SGLT2 inhibitors, nor incretin therapy.  - Patient specific target  A1c;  LDL, HDL, Triglycerides, and  Waist Circumference were discussed in detail.  2) BP/HTN: Her blood pressure is controlled to target.  She is advised to continue current medications including losartan . 3) Lipids/HPL:   Her previsit show LDL of 75.  She will continue to benefit from statin therapy.  She is advised to continue simvastatin 40 mg p.o. nightly.    4)  Weight/Diet: CDE Consult will be initiated , exercise, and detailed carbohydrates information provided.  5) vitamin D deficiency-new diagnosis for I discussed and initiated cholecalciferol 5000 units daily for 90 days.  6) Chronic Care/Health Maintenance:  -she  is on ACEI/ARB and Statin medications and  is encouraged to initiate and continue to follow up with Ophthalmology, Dentist,  Podiatrist at least yearly or according to recommendations, and advised to  stay away from smoking. I have recommended yearly flu vaccine and pneumonia vaccine at least every 5 years; moderate intensity exercise for up to 150 minutes weekly; and  sleep for at least 7 hours a day.  - I advised patient to maintain close follow up with Rosita Fire, MD for primary care needs.  - Time spent with the patient: 25 min, of which >50% was spent in reviewing her  current and  previous labs, previous treatments, and medications doses and  developing a plan for long-term care.  Amy Stein participated in the discussions, expressed understanding, and voiced agreement with the above plans.  All questions were answered to her satisfaction. she is encouraged to contact clinic should she have any questions or concerns prior to her return visit.   Follow up plan: - Return in about 3 months (around 05/04/2018) for Follow up with Pre-visit Labs, Meter, and Logs.  Glade Lloyd, MD [facility] [address] [place], [place] [address] Phone: [phone]  Fax: [phone]    02/01/2018, 11:51 AM  This note was partially dictated with voice recognition software. Similar sounding words can be transcribed inadequately or may not  be corrected upon review.

## 2018-02-20 ENCOUNTER — Encounter: Payer: 59 | Attending: Internal Medicine | Admitting: Nutrition

## 2018-02-20 VITALS — Ht 62.0 in | Wt 151.0 lb

## 2018-02-20 DIAGNOSIS — Z794 Long term (current) use of insulin: Secondary | ICD-10-CM | POA: Insufficient documentation

## 2018-02-20 DIAGNOSIS — E118 Type 2 diabetes mellitus with unspecified complications: Secondary | ICD-10-CM

## 2018-02-20 DIAGNOSIS — N183 Chronic kidney disease, stage 3 unspecified: Secondary | ICD-10-CM

## 2018-02-20 DIAGNOSIS — IMO0002 Reserved for concepts with insufficient information to code with codable children: Secondary | ICD-10-CM

## 2018-02-20 DIAGNOSIS — E1122 Type 2 diabetes mellitus with diabetic chronic kidney disease: Secondary | ICD-10-CM | POA: Diagnosis not present

## 2018-02-20 DIAGNOSIS — E1165 Type 2 diabetes mellitus with hyperglycemia: Secondary | ICD-10-CM

## 2018-02-20 NOTE — Progress Notes (Signed)
  Medical Nutrition Therapy:  Appt start time: 2130 end time:  1630.   Assessment:  Primary concerns today: DIabetes Type 2 with Stg 3 CKD.Amy Stein  Has a female neighbor/ friend here with her. She lives with her grandson.  Eats 2-3 meals per day.  She does the cooking and shopping. She baked, fries most foods. Likes sweets and desserts. Cooks some and eats out some   Checking BS twice a day. FBS 97-201 mg/dl. Before bed 100's. Takes 35 units of lantus daily.  Eats meals inconsistently contributing to elevated blood sugars. Sometimes doesn't feel hungry and then grazes or snacks at other times instead of eating a meal. Wiling to make changes with diet and increase walking some to help improve her DM.   Lab Results  Component Value Date   HGBA1C 8.6 (H) 01/31/2018   CMP Latest Ref Rng & Units 01/31/2018 05/29/2017 05/26/2017  Glucose 65 - 99 mg/dL 130(H) 139(H) 99  BUN 7 - 25 mg/dL 32(H) 23(H) 23(H)  Creatinine 0.60 - 0.88 mg/dL 1.59(H) 1.53(H) 1.22(H)  Sodium 135 - 146 mmol/L 141 136 139  Potassium 3.5 - 5.3 mmol/L 3.7 3.5 3.9  Chloride 98 - 110 mmol/L 103 102 105  CO2 20 - 32 mmol/L 31 20(L) 24  Calcium 8.6 - 10.4 mg/dL 9.5 9.3 8.3(L)  Total Protein 6.1 - 8.1 g/dL 7.2 7.9 -  Total Bilirubin 0.2 - 1.2 mg/dL 0.4 0.6 -  Alkaline Phos 38 - 126 U/L - 75 -  AST 10 - 35 U/L 21 35 -  ALT 6 - 29 U/L 15 29 -     Preferred Learning Style:  No preference indicated   Learning Readiness:   Ready  Change in progress   MEDICATIONS: see list   DIETARY INTAKE:   24-hr recall:  B ( AM): 2 eggs, and 1 cup almond milk Snk ( AM):  L ( PM): apple,  OR bologna sandwich, water Snk ( PM): D ( PM): chicken, mashed potatoes, string beans and collard greens, water Snk ( PM): Beverages: water  Usual physical activity:  walks  Estimated energy needs: 1200  calories 135 g carbohydrates 90 g protein 33 g fat  Progress Towards Goal(s):  In progress.   Nutritional Diagnosis:  NB-1.1 Food and  nutrition-related knowledge deficit As related to Diabetes.  As evidenced by A1C .    Intervention:  Nutrition and Diabetes education provided on My Plate, CHO counting, meal planning, portion sizes, timing of meals, avoiding snacks between meals unless having a low blood sugar, target ranges for A1C and blood sugars, signs/symptoms and treatment of hyper/hypoglycemia, monitoring blood sugars, taking medications as prescribed, benefits of exercising 30 minutes per day and prevention of complications of DM.Amy Stein Goals 1. Follow MY Plate 2. Eat meals on time 3. Cut out snacks 4. Walk to mailbox 2-3 times per day Drink only water  Teaching Method Utilized:  Visual Auditory Hands on  Handouts given during visit include:  My Plate   Meal Plan  Diabetes Instructions    Barriers to learning/adherence to lifestyle change: none  Demonstrated degree of understanding via:  Teach Back   Monitoring/Evaluation:  Dietary intake, exercise, , and body weight in 1 month(s).

## 2018-02-20 NOTE — Patient Instructions (Signed)
Goals 1. Follow MY Plate 2. Eat meals on time 3. Cut out snacks 4. Walk to mailbox 2-3 times per day Drink only water

## 2018-02-21 ENCOUNTER — Encounter: Payer: Self-pay | Admitting: Nutrition

## 2018-05-05 ENCOUNTER — Ambulatory Visit: Payer: Medicaid Other | Admitting: "Endocrinology

## 2018-05-06 LAB — COMPLETE METABOLIC PANEL WITH GFR
AG RATIO: 1.4 (calc) (ref 1.0–2.5)
ALT: 17 U/L (ref 6–29)
AST: 23 U/L (ref 10–35)
Albumin: 4 g/dL (ref 3.6–5.1)
Alkaline phosphatase (APISO): 66 U/L (ref 33–130)
BUN/Creatinine Ratio: 14 (calc) (ref 6–22)
BUN: 20 mg/dL (ref 7–25)
CALCIUM: 9.6 mg/dL (ref 8.6–10.4)
CO2: 29 mmol/L (ref 20–32)
CREATININE: 1.44 mg/dL — AB (ref 0.60–0.88)
Chloride: 107 mmol/L (ref 98–110)
GFR, EST NON AFRICAN AMERICAN: 34 mL/min/{1.73_m2} — AB (ref >=60)
GFR, Est African American: 39 mL/min/{1.73_m2} — ABNORMAL LOW (ref >=60)
Globulin: 2.9 g/dL (calc) (ref 1.9–3.7)
Glucose, Bld: 83 mg/dL (ref 65–139)
POTASSIUM: 4.1 mmol/L (ref 3.5–5.3)
Sodium: 143 mmol/L (ref 135–146)
Total Bilirubin: 0.4 mg/dL (ref 0.2–1.2)
Total Protein: 6.9 g/dL (ref 6.1–8.1)

## 2018-05-06 LAB — HEMOGLOBIN A1C
EAG (MMOL/L): 10.1 (calc)
Hgb A1c MFr Bld: 8 % of total Hgb — ABNORMAL HIGH (ref <5.7)
Mean Plasma Glucose: 183 (calc)

## 2018-05-08 ENCOUNTER — Other Ambulatory Visit: Payer: Self-pay | Admitting: "Endocrinology

## 2018-05-17 ENCOUNTER — Ambulatory Visit: Payer: 59 | Admitting: Nutrition

## 2018-05-19 ENCOUNTER — Ambulatory Visit (INDEPENDENT_AMBULATORY_CARE_PROVIDER_SITE_OTHER): Payer: 59 | Admitting: "Endocrinology

## 2018-05-19 ENCOUNTER — Encounter: Payer: Self-pay | Admitting: "Endocrinology

## 2018-05-19 VITALS — BP 119/72 | HR 78 | Ht 62.0 in | Wt 155.0 lb

## 2018-05-19 DIAGNOSIS — N183 Chronic kidney disease, stage 3 (moderate): Secondary | ICD-10-CM

## 2018-05-19 DIAGNOSIS — E1122 Type 2 diabetes mellitus with diabetic chronic kidney disease: Secondary | ICD-10-CM | POA: Diagnosis not present

## 2018-05-19 DIAGNOSIS — E782 Mixed hyperlipidemia: Secondary | ICD-10-CM

## 2018-05-19 DIAGNOSIS — I10 Essential (primary) hypertension: Secondary | ICD-10-CM

## 2018-05-19 DIAGNOSIS — Z794 Long term (current) use of insulin: Secondary | ICD-10-CM

## 2018-05-19 NOTE — Progress Notes (Signed)
Endocrinology follow-up  Note       05/19/2018, 10:01 AM   Subjective:    Patient ID: Amy Stein, female    DOB: 1933-09-29.  Amy Stein is being seen in follow-up  for management of currently uncontrolled symptomatic type 2 diabetes, hypertension, hyperlipidemia. PMD:   Rosita Fire, MD.   Past Medical History:  Diagnosis Date  . Diabetes mellitus   . Hypertension   . Shingles    on both legs 1/18   Past Surgical History:  Procedure Laterality Date  . COLONOSCOPY    . COLONOSCOPY N/A 02/23/2017   Procedure: COLONOSCOPY;  Surgeon: Daneil Dolin, MD;  [facility];  Service: Endoscopy;  Laterality: N/A;  9:30 Am  . POLYPECTOMY  02/23/2017   Procedure: POLYPECTOMY;  Surgeon: Daneil Dolin, MD;  Location: [facility];  Service: Endoscopy;;  cecum   Social History   Socioeconomic History  . Marital status: Single    Spouse name: Not on file  . Number of children: Not on file  . Years of education: Not on file  . Highest education level: Not on file  Occupational History  . Not on file  Social Needs  . Financial resource strain: Not on file  . Food insecurity:    Worry: Not on file    Inability: Not on file  . Transportation needs:    Medical: Not on file    Non-medical: Not on file  Tobacco Use  . Smoking status: Former Research scientist (life sciences)  . Smokeless tobacco: Never Used  Substance and Sexual Activity  . Alcohol use: No  . Drug use: No  . Sexual activity: Yes    Birth control/protection: Post-menopausal  Lifestyle  . Physical activity:    Days per week: Not on file    Minutes per session: Not on file  . Stress: Not on file  Relationships  . Social connections:    Talks on phone: Not on file    Gets together: Not on file    Attends religious service: Not on file    Active member of club or organization: Not on file    Attends meetings of clubs or organizations: Not on  file    Relationship status: Not on file  Other Topics Concern  . Not on file  Social History Narrative  . Not on file   Outpatient Encounter Medications as of 05/19/2018  Medication Sig  . aspirin EC 81 MG tablet Take 81 mg by mouth daily.  . ferrous sulfate 325 (65 FE) MG tablet Take 1 tablet (325 mg total) by mouth 2 (two) times daily with a meal.  . gabapentin (NEURONTIN) 400 MG capsule Take 400 mg by mouth 3 (three) times daily.   Marland Kitchen HM VITAMIN D3 100 MCG (4000 UT) CAPS TAKE ONE CAPSULE (5000 UNITS TOTAL) BY MOUTH DAILY  . hydrochlorothiazide (MICROZIDE) 12.5 MG capsule Take 12.5 mg by mouth daily.  . Insulin Glargine (LANTUS SOLOSTAR) 100 UNIT/ML Solostar Pen Inject 35 Units into the skin at bedtime.  . Insulin Pen Needle (PEN NEEDLES) 32G X 4 MM MISC 1 each by Does not  apply route at bedtime.  Marland Kitchen labetalol (NORMODYNE) 100 MG tablet Take 100 mg by mouth 2 (two) times daily.   Marland Kitchen losartan (COZAAR) 100 MG tablet Take 100 mg by mouth daily.  . medroxyPROGESTERone (PROVERA) 10 MG tablet Take 1 tablet (10 mg total) by mouth See admin instructions. Take 10 mg by mouth daily for the first 10 days of the month  . omeprazole (PRILOSEC) 20 MG capsule Take 20 mg by mouth daily.  Marland Kitchen oxybutynin (DITROPAN) 5 MG tablet Take 5 mg by mouth daily.  . simvastatin (ZOCOR) 40 MG tablet Take 40 mg by mouth at bedtime.   . vitamin B-12 1000 MCG tablet Take 1 tablet (1,000 mcg total) by mouth daily.   No facility-administered encounter medications on file as of 05/19/2018.     ALLERGIES: Allergies  Allergen Reactions  . Sulfonamide Derivatives Rash    VACCINATION STATUS:  There is no immunization history on file for this patient.  Diabetes  She presents for her follow-up diabetic visit. She has type 2 diabetes mellitus. Onset time: She was diagnosed at approximate age of 83 years. Her disease course has been improving. There are no hypoglycemic associated symptoms. Pertinent negatives for hypoglycemia  include no confusion, headaches, pallor or seizures. There are no diabetic associated symptoms. Pertinent negatives for diabetes include no chest pain, no polydipsia, no polyphagia and no polyuria. There are no hypoglycemic complications. Symptoms are improving. Diabetic complications include nephropathy. Risk factors for coronary artery disease include diabetes mellitus, hypertension, sedentary lifestyle and tobacco exposure. Current diabetic treatment includes insulin injections. Her weight is stable. She is following a generally unhealthy diet. When asked about meal planning, she reported none. She has not had a previous visit with a dietitian. She rarely participates in exercise. Her home blood glucose trend is fluctuating minimally. (She did not bring any meter, presents with suspicious looking blood glucose readings and her logs.  Her A1c is 8% slightly better than last time when it was 8.6%.   ) An ACE inhibitor/angiotensin II receptor blocker is being taken. Eye exam is current.  Hypertension  This is a chronic problem. The current episode started more than 1 year ago. The problem is controlled. Pertinent negatives include no chest pain, headaches, palpitations or shortness of breath. Risk factors for coronary artery disease include diabetes mellitus, sedentary lifestyle, smoking/tobacco exposure and post-menopausal state. Past treatments include angiotensin blockers. Hypertensive end-organ damage includes kidney disease. Identifiable causes of hypertension include chronic renal disease.    Review of Systems  Constitutional: Negative for chills, fever and unexpected weight change.  HENT: Negative for trouble swallowing and voice change.   Eyes: Negative for visual disturbance.  Respiratory: Negative for cough, shortness of breath and wheezing.   Cardiovascular: Negative for chest pain, palpitations and leg swelling.  Gastrointestinal: Negative for diarrhea, nausea and vomiting.  Endocrine:  Negative for cold intolerance, heat intolerance, polydipsia, polyphagia and polyuria.  Musculoskeletal: Negative for arthralgias and myalgias.  Skin: Negative for color change, pallor, rash and wound.  Neurological: Negative for seizures and headaches.  Psychiatric/Behavioral: Negative for confusion and suicidal ideas.    Objective:    BP 119/72   Pulse 78   Ht 5\' 2"  (1.575 m)   Wt 155 lb (70.3 kg)   BMI 28.35 kg/m   Wt Readings from Last 3 Encounters:  05/19/18 155 lb (70.3 kg)  02/20/18 151 lb (68.5 kg)  02/01/18 151 lb (68.5 kg)     Physical Exam Constitutional:  Appearance: She is well-developed.  HENT:     Head: Normocephalic and atraumatic.  Neck:     Musculoskeletal: Normal range of motion and neck supple.     Thyroid: No thyromegaly.     Trachea: No tracheal deviation.  Cardiovascular:     Rate and Rhythm: Normal rate and regular rhythm.  Pulmonary:     Effort: Pulmonary effort is normal.     Breath sounds: Normal breath sounds.  Abdominal:     General: Bowel sounds are normal.     Palpations: Abdomen is soft.     Tenderness: There is no abdominal tenderness. There is no guarding.  Musculoskeletal: Normal range of motion.  Skin:    General: Skin is warm and dry.     Coloration: Skin is not pale.     Findings: No erythema or rash.  Neurological:     Mental Status: She is alert and oriented to person, place, and time.     Cranial Nerves: No cranial nerve deficit.     Coordination: Coordination normal.     Deep Tendon Reflexes: Reflexes are normal and symmetric.  Psychiatric:        Judgment: Judgment normal.     CMP     Component Value Date/Time   NA 143 05/05/2018 1518   K 4.1 05/05/2018 1518   CL 107 05/05/2018 1518   CO2 29 05/05/2018 1518   GLUCOSE 83 05/05/2018 1518   BUN 20 05/05/2018 1518   CREATININE 1.44 (H) 05/05/2018 1518   CALCIUM 9.6 05/05/2018 1518   PROT 6.9 05/05/2018 1518   ALBUMIN 3.4 (L) 05/29/2017 2044   AST 23  05/05/2018 1518   ALT 17 05/05/2018 1518   ALKPHOS 75 05/29/2017 2044   BILITOT 0.4 05/05/2018 1518   GFRNONAA 34 (L) 05/05/2018 1518   GFRAA 39 (L) 05/05/2018 1518    Diabetic Labs (most recent): Lab Results  Component Value Date   HGBA1C 8.0 (H) 05/05/2018   HGBA1C 8.6 (H) 01/31/2018   HGBA1C 7.6 (H) 05/26/2017      Lab Results  Component Value Date   TSH 1.29 01/31/2018   TSH 0.793 05/26/2017   FREET4 0.9 01/31/2018   Lipid Panel     Component Value Date/Time   CHOL 134 01/31/2018 0941   TRIG 115 01/31/2018 0941   HDL 39 (L) 01/31/2018 0941   CHOLHDL 3.4 01/31/2018 0941   LDLCALC 75 01/31/2018 0941      Assessment & Plan:   1. Type 2 diabetes mellitus with stage 3 chronic kidney disease, with long-term current use of insulin (Amy Stein)  - Amy Stein has currently uncontrolled symptomatic type 2 DM since 67 years of ago. -Once again, patient present with no meter.  Her logs are suspicious.  Her A1c is 8.0% improving from 8.6%.    - Recent labs reviewed.  -her diabetes is complicated by stage 3 renal insufficiency and Amy Stein remains at a high risk for more acute and chronic complications which include CAD, CVA, CKD, retinopathy, and neuropathy. These are all discussed in detail with the patient.  - I have counseled her on diet management by adopting a carbohydrate restricted/protein rich diet.  - Patient admits there is a room for improvement in her diet and drink choices. -  Suggestion is made for her to avoid simple carbohydrates  from her diet including Cakes, Sweet Desserts / Pastries, Ice Cream, Soda (diet and regular), Sweet Tea, Candies, Chips, Cookies, Store Bought Juices, Alcohol in Excess  of  1-2 drinks a day, Artificial Sweeteners, and "Sugar-free" Products. This will help patient to have stable blood glucose profile and potentially avoid unintended weight gain.  - I encouraged her to switch to  unprocessed or minimally processed complex starch and  increased protein intake (animal or plant source), fruits, and vegetables.  - she is advised to stick to a routine mealtimes to eat 3 meals  a day and avoid unnecessary snacks ( to snack only to correct hypoglycemia).   - I have approached her with the following individualized plan to manage diabetes and patient agrees:   -She is elderly patient with several comorbidities, #1 priority and treating her diabetes would be to avoid hypoglycemia.    -She has questionable compliance for monitoring, promises that she will bring her meter and logs next visit.   -Given her A1c of 8%, I discussed and continued her basal insulin Lantus at 35 units nightly associated with strict monitoring of blood glucose 2 times a day-daily before breakfast and at bedtime.    -Patient is encouraged to call clinic for blood glucose levels less than 70 or above 300 mg /dl.  -She is not a candidate for metformin, SGLT2 inhibitors, nor incretin therapy.  - Patient specific target  A1c;  LDL, HDL, Triglycerides, and  Waist Circumference were discussed in detail.  2) BP/HTN: Her blood pressure is controlled to target.  She is advised to continue current medications including losartan . 3) Lipids/HPL:   Her previsit show LDL of 75.  She will continue to benefit from statin therapy. She is advised to continue simvastatin 40 mg p.o. nightly.    4)  Weight/Diet: CDE Consult will be initiated , exercise, and detailed carbohydrates information provided.  5) vitamin D deficiency-new diagnosis for I discussed and initiated cholecalciferol 5000 units daily for 90 days.  6) Chronic Care/Health Maintenance:  -she  is on ACEI/ARB and Statin medications and  is encouraged to initiate and continue to follow up with Ophthalmology, Dentist,  Podiatrist at least yearly or according to recommendations, and advised to  stay away from smoking. I have recommended yearly flu vaccine and pneumonia vaccine at least every 5 years; moderate  intensity exercise for up to 150 minutes weekly; and  sleep for at least 7 hours a day.  - I advised patient to maintain close follow up with Rosita Fire, MD for primary care needs.  - Time spent with the patient: 25 min, of which >50% was spent in reviewing her blood glucose logs , discussing her hypoglycemia and hyperglycemia episodes, reviewing her current and  previous labs / studies and medications  doses and developing a plan to avoid hypoglycemia and hyperglycemia. Please refer to Patient Instructions for Blood Glucose Monitoring and Insulin/Medications Dosing Guide"  in media tab for additional information. Amy Stein participated in the discussions, expressed understanding, and voiced agreement with the above plans.  All questions were answered to her satisfaction. she is encouraged to contact clinic should she have any questions or concerns prior to her return visit.   Follow up plan: - No follow-ups on file.  Glade Lloyd, MD [facility] [address] [place], [place] [address] Phone: [phone]  Fax: [phone]    05/19/2018, 10:01 AM  This note was partially dictated with voice recognition software. Similar sounding words can be transcribed inadequately or may not  be corrected upon review.

## 2018-05-19 NOTE — Patient Instructions (Signed)

## 2018-06-09 NOTE — Congregational Nurse Program (Unsigned)
  Dept: [phone]   Congregational Nurse Program Note  Date of Encounter: 06/07/2018  Past Medical History: Past Medical History:  Diagnosis Date  . Diabetes mellitus   . Hypertension   . Shingles    on both legs 1/18    Encounter Details: CNP Questionnaire - 06/09/18 1015      Questionnaire   Race  Black or African American    Location Patient [place]  Medicare    Uninsured  Not Applicable    Food  Yes, have food insecurities    Housing/Utilities  Yes, have permanent housing    Transportation  No transportation needs    Interpersonal Safety  Yes, feel physically and emotionally safe where you currently live    Medication  No medication insecurities    Medical Provider  Yes    Referrals  Not Applicable    ED Visit Averted  Not Applicable    Life-Saving Intervention Made  Not Applicable      Client was assisted with BP reading today.  Normal limits. Client has PCP and is compliant with followup Checkups to prevent further problems. Amy Stein [phone].

## 2018-09-19 ENCOUNTER — Ambulatory Visit: Payer: Medicaid Other | Admitting: "Endocrinology

## 2018-10-11 ENCOUNTER — Encounter (INDEPENDENT_AMBULATORY_CARE_PROVIDER_SITE_OTHER): Payer: Medicaid Other | Admitting: Ophthalmology

## 2018-10-24 ENCOUNTER — Encounter (INDEPENDENT_AMBULATORY_CARE_PROVIDER_SITE_OTHER): Payer: Medicaid Other | Admitting: Ophthalmology

## 2018-10-30 ENCOUNTER — Ambulatory Visit (HOSPITAL_COMMUNITY)
Admission: RE | Admit: 2018-10-30 | Discharge: 2018-10-30 | Disposition: A | Discharge status: Home / Self Care | Payer: 59 | Source: Ambulatory Visit | Attending: Internal Medicine | Admitting: Internal Medicine

## 2018-10-30 ENCOUNTER — Other Ambulatory Visit: Payer: Self-pay

## 2018-10-30 ENCOUNTER — Other Ambulatory Visit (HOSPITAL_COMMUNITY): Payer: Self-pay | Admitting: Internal Medicine

## 2018-10-30 DIAGNOSIS — M25512 Pain in left shoulder: Secondary | ICD-10-CM | POA: Insufficient documentation

## 2018-12-19 ENCOUNTER — Other Ambulatory Visit (HOSPITAL_COMMUNITY): Payer: Self-pay | Admitting: Internal Medicine

## 2018-12-19 DIAGNOSIS — Z1231 Encounter for screening mammogram for malignant neoplasm of breast: Secondary | ICD-10-CM

## 2019-01-01 ENCOUNTER — Ambulatory Visit (HOSPITAL_COMMUNITY)
Admission: RE | Admit: 2019-01-01 | Discharge: 2019-01-01 | Disposition: A | Discharge status: Home / Self Care | Payer: 59 | Source: Ambulatory Visit | Attending: Internal Medicine | Admitting: Internal Medicine

## 2019-01-01 DIAGNOSIS — Z1231 Encounter for screening mammogram for malignant neoplasm of breast: Secondary | ICD-10-CM | POA: Insufficient documentation

## 2019-01-17 ENCOUNTER — Other Ambulatory Visit: Payer: Self-pay

## 2019-01-17 ENCOUNTER — Ambulatory Visit (INDEPENDENT_AMBULATORY_CARE_PROVIDER_SITE_OTHER): Payer: 59 | Admitting: Orthopedic Surgery

## 2019-01-17 ENCOUNTER — Ambulatory Visit: Payer: 59

## 2019-01-17 VITALS — BP 131/71 | HR 76 | Temp 97.1°F | Ht 62.0 in | Wt 178.0 lb

## 2019-01-17 DIAGNOSIS — M19012 Primary osteoarthritis, left shoulder: Secondary | ICD-10-CM

## 2019-01-17 DIAGNOSIS — M25512 Pain in left shoulder: Secondary | ICD-10-CM | POA: Diagnosis not present

## 2019-01-17 DIAGNOSIS — M19019 Primary osteoarthritis, unspecified shoulder: Secondary | ICD-10-CM

## 2019-01-17 MED ORDER — NAPROXEN 500 MG PO TABS: 500.0000 mg | ORAL_TABLET | Freq: Two times a day (BID) | ORAL | 0 refills | Status: DC

## 2019-01-17 NOTE — Patient Instructions (Signed)

## 2019-01-17 NOTE — Progress Notes (Signed)
Chief Complaint  Patient presents with  . Shoulder Pain    Left shoulder pain, no injury, referred by Dr. Legrand Rams.   Amy Stein presents as an 85 year old female with a 1 to 50-month history of pain left shoulder and deltoid with aching and stiffness no history of trauma.  She did try Tylenol no relief  Review of Systems  Unable to perform ROS: Age   Past Medical History:  Diagnosis Date  . Diabetes mellitus   . Hypertension   . Shingles    on both legs 1/18   Past Surgical History:  Procedure Laterality Date  . COLONOSCOPY    . COLONOSCOPY N/A 02/23/2017   Procedure: COLONOSCOPY;  Surgeon: Daneil Dolin, MD;  [facility];  Service: Endoscopy;  Laterality: N/A;  9:30 Am  . POLYPECTOMY  02/23/2017   Procedure: POLYPECTOMY;  Surgeon: Daneil Dolin, MD;  [facility];  Service: Endoscopy;;  cecum    BP 131/71   Pulse 76   Temp (!) 97.1 F (36.2 C)   Ht 5\' 2"  (1.575 m)   Wt 178 lb (80.7 kg)   BMI 32.56 kg/m   She is a pleasant well-developed well-nourished female oriented x3 mood and affect normal gait assisted with a cane in the right hand with good balance.  Her right shoulder looks normal with no swelling full range of motion is stable her strength and muscle tone are normal the skin is excellent she has a good pulse no lymph nodes enlarged and normal sensation  On the opposite side of decreased external rotation abduction and flexion stable to inferior subluxation test weakness in all cuff muscle skin normal pulse good lymph nodes negative sensation normal  She did have an x-ray she has osteoarthritis grade 1-2 left shoulder  Certainly recommend nonoperative treatment in this case Physical therapy Anti-inflammatory Injection  Procedure note the subacromial injection shoulder left   Verbal consent was obtained to inject the  Left   Shoulder  Timeout was completed to confirm the injection site is a subacromial space of the  left   shoulder  Medication used Depo-Medrol 40 mg and lidocaine 1% 3 cc  Anesthesia was provided by ethyl chloride  The injection was performed in the left  posterior subacromial space. After pinning the skin with alcohol and anesthetized the skin with ethyl chloride the subacromial space was injected using a 20-gauge needle. There were no complications  Sterile dressing was applied.  Meds ordered this encounter  Medications  . naproxen (NAPROSYN) 500 MG tablet    Sig: Take 1 tablet (500 mg total) by mouth 2 (two) times daily with a meal.    Dispense:  60 tablet    Refill:  0

## 2019-02-19 ENCOUNTER — Other Ambulatory Visit: Payer: Self-pay | Admitting: "Endocrinology

## 2019-02-19 DIAGNOSIS — N183 Chronic kidney disease, stage 3 unspecified: Secondary | ICD-10-CM

## 2019-02-20 LAB — COMPLETE METABOLIC PANEL WITH GFR
AG Ratio: 1.3 (calc) (ref 1.0–2.5)
ALT: 20 U/L (ref 6–29)
AST: 25 U/L (ref 10–35)
Albumin: 3.9 g/dL (ref 3.6–5.1)
Alkaline phosphatase (APISO): 81 U/L (ref 37–153)
BUN/Creatinine Ratio: 21 (calc) (ref 6–22)
BUN: 30 mg/dL — ABNORMAL HIGH (ref 7–25)
CO2: 28 mmol/L (ref 20–32)
Calcium: 9.6 mg/dL (ref 8.6–10.4)
Chloride: 102 mmol/L (ref 98–110)
Creat: 1.42 mg/dL — ABNORMAL HIGH (ref 0.60–0.88)
GFR, Est African American: 39 mL/min/{1.73_m2} — ABNORMAL LOW (ref >=60)
GFR, Est Non African American: 34 mL/min/{1.73_m2} — ABNORMAL LOW (ref >=60)
Globulin: 2.9 g/dL (calc) (ref 1.9–3.7)
Glucose, Bld: 134 mg/dL (ref 65–139)
Potassium: 3.8 mmol/L (ref 3.5–5.3)
Sodium: 140 mmol/L (ref 135–146)
Total Bilirubin: 0.4 mg/dL (ref 0.2–1.2)
Total Protein: 6.8 g/dL (ref 6.1–8.1)

## 2019-02-20 LAB — MICROALBUMIN / CREATININE URINE RATIO
Creatinine, Urine: 37 mg/dL (ref 20–275)
Microalb Creat Ratio: 22 mcg/mg creat (ref <30)
Microalb, Ur: 0.8 mg/dL

## 2019-02-20 LAB — HEMOGLOBIN A1C
Hgb A1c MFr Bld: 7.6 % of total Hgb — ABNORMAL HIGH (ref <5.7)
Mean Plasma Glucose: 171 (calc)
eAG (mmol/L): 9.5 (calc)

## 2019-02-27 ENCOUNTER — Encounter: Payer: Self-pay | Admitting: "Endocrinology

## 2019-02-27 ENCOUNTER — Other Ambulatory Visit: Payer: Self-pay

## 2019-02-27 ENCOUNTER — Ambulatory Visit (INDEPENDENT_AMBULATORY_CARE_PROVIDER_SITE_OTHER): Payer: 59 | Admitting: "Endocrinology

## 2019-02-27 VITALS — BP 138/73 | HR 75 | Ht 62.0 in | Wt 179.0 lb

## 2019-02-27 DIAGNOSIS — E1122 Type 2 diabetes mellitus with diabetic chronic kidney disease: Secondary | ICD-10-CM

## 2019-02-27 DIAGNOSIS — N183 Chronic kidney disease, stage 3 unspecified: Secondary | ICD-10-CM

## 2019-02-27 DIAGNOSIS — Z794 Long term (current) use of insulin: Secondary | ICD-10-CM

## 2019-02-27 DIAGNOSIS — I10 Essential (primary) hypertension: Secondary | ICD-10-CM

## 2019-02-27 DIAGNOSIS — E559 Vitamin D deficiency, unspecified: Secondary | ICD-10-CM | POA: Diagnosis not present

## 2019-02-27 DIAGNOSIS — E782 Mixed hyperlipidemia: Secondary | ICD-10-CM

## 2019-02-27 NOTE — Progress Notes (Signed)
Endocrinology follow-up  Note       02/27/2019, 11:49 AM   Subjective:    Patient ID: Amy Stein, female    DOB: 10/16/1933.  Amy Stein is being seen in follow-up  for management of currently uncontrolled symptomatic type 2 diabetes, hypertension, hyperlipidemia. PMD:   Rosita Fire, MD.   Past Medical History:  Diagnosis Date  . Diabetes mellitus   . Hypertension   . Shingles    on both legs 1/18   Past Surgical History:  Procedure Laterality Date  . COLONOSCOPY    . COLONOSCOPY N/A 02/23/2017   Procedure: COLONOSCOPY;  Surgeon: Daneil Dolin, MD;  Location: [facility];  Service: Endoscopy;  Laterality: N/A;  9:30 Am  . POLYPECTOMY  02/23/2017   Procedure: POLYPECTOMY;  Surgeon: Daneil Dolin, MD;  Location: [facility];  Service: Endoscopy;;  cecum   Social History   Socioeconomic History  . Marital status: Single    Spouse name: Not on file  . Number of children: Not on file  . Years of education: Not on file  . Highest education level: Not on file  Occupational History  . Not on file  Social Needs  . Financial resource strain: Not on file  . Food insecurity    Worry: Not on file    Inability: Not on file  . Transportation needs    Medical: Not on file    Non-medical: Not on file  Tobacco Use  . Smoking status: Former Research scientist (life sciences)  . Smokeless tobacco: Never Used  Substance and Sexual Activity  . Alcohol use: No  . Drug use: No  . Sexual activity: Yes    Birth control/protection: Post-menopausal  Lifestyle  . Physical activity    Days per week: Not on file    Minutes per session: Not on file  . Stress: Not on file  Relationships  . Social Herbalist on phone: Not on file    Gets together: Not on file    Attends religious service: Not on file    Active member of club or organization: Not on file    Attends meetings of clubs or organizations: Not on file     Relationship status: Not on file  Other Topics Concern  . Not on file  Social History Narrative  . Not on file   Outpatient Encounter Medications as of 02/27/2019  Medication Sig  . aspirin EC 81 MG tablet Take 81 mg by mouth daily.  . ferrous sulfate 325 (65 FE) MG tablet Take 1 tablet (325 mg total) by mouth 2 (two) times daily with a meal.  . gabapentin (NEURONTIN) 400 MG capsule Take 400 mg by mouth 3 (three) times daily.   Marland Kitchen HM VITAMIN D3 100 MCG (4000 UT) CAPS TAKE ONE CAPSULE (5000 UNITS TOTAL) BY MOUTH DAILY  . hydrochlorothiazide (MICROZIDE) 12.5 MG capsule Take 12.5 mg by mouth daily.  . Insulin Glargine (LANTUS SOLOSTAR) 100 UNIT/ML Solostar Pen Inject 35 Units into the skin at bedtime.  . Insulin Pen Needle (PEN NEEDLES) 32G X 4 MM MISC 1 each by Does not  apply route at bedtime.  Marland Kitchen labetalol (NORMODYNE) 100 MG tablet Take 100 mg by mouth 2 (two) times daily.   Marland Kitchen losartan (COZAAR) 100 MG tablet Take 100 mg by mouth daily.  . medroxyPROGESTERone (PROVERA) 10 MG tablet Take 1 tablet (10 mg total) by mouth See admin instructions. Take 10 mg by mouth daily for the first 10 days of the month  . naproxen (NAPROSYN) 500 MG tablet Take 1 tablet (500 mg total) by mouth 2 (two) times daily with a meal.  . omeprazole (PRILOSEC) 20 MG capsule Take 20 mg by mouth daily.  Marland Kitchen oxybutynin (DITROPAN) 5 MG tablet Take 5 mg by mouth daily.  . simvastatin (ZOCOR) 40 MG tablet Take 40 mg by mouth at bedtime.   . vitamin B-12 1000 MCG tablet Take 1 tablet (1,000 mcg total) by mouth daily.   No facility-administered encounter medications on file as of 02/27/2019.     ALLERGIES: Allergies  Allergen Reactions  . Sulfonamide Derivatives Rash    VACCINATION STATUS:  There is no immunization history on file for this patient.  Diabetes She presents for her follow-up diabetic visit. She has type 2 diabetes mellitus. Onset time: She was diagnosed at approximate age of 33 years. Her disease course  has been improving. There are no hypoglycemic associated symptoms. Pertinent negatives for hypoglycemia include no confusion, headaches, pallor or seizures. There are no diabetic associated symptoms. Pertinent negatives for diabetes include no chest pain, no polydipsia, no polyphagia and no polyuria. There are no hypoglycemic complications. Symptoms are improving. Diabetic complications include nephropathy. Risk factors for coronary artery disease include diabetes mellitus, hypertension, sedentary lifestyle and tobacco exposure. Current diabetic treatment includes insulin injections. Her weight is fluctuating minimally. She is following a generally unhealthy diet. When asked about meal planning, she reported none. She has not had a previous visit with a dietitian. She rarely participates in exercise. Her home blood glucose trend is fluctuating minimally. (She is currently in nursing home, did not send her logs.  Her point-of-care A1c 7.6%, progressively improving from 8.6%.  ) An ACE inhibitor/angiotensin II receptor blocker is being taken. Eye exam is current.  Hypertension This is a chronic problem. The current episode started more than 1 year ago. The problem is controlled. Pertinent negatives include no chest pain, headaches, palpitations or shortness of breath. Risk factors for coronary artery disease include diabetes mellitus, sedentary lifestyle, smoking/tobacco exposure and post-menopausal state. Past treatments include angiotensin blockers. Hypertensive end-organ damage includes kidney disease. Identifiable causes of hypertension include chronic renal disease.    Review of Systems  Constitutional: Negative for chills, fever and unexpected weight change.  HENT: Negative for trouble swallowing and voice change.   Eyes: Negative for visual disturbance.  Respiratory: Negative for cough, shortness of breath and wheezing.   Cardiovascular: Negative for chest pain, palpitations and leg swelling.   Gastrointestinal: Negative for diarrhea, nausea and vomiting.  Endocrine: Negative for cold intolerance, heat intolerance, polydipsia, polyphagia and polyuria.  Musculoskeletal: Negative for arthralgias and myalgias.  Skin: Negative for color change, pallor, rash and wound.  Neurological: Negative for seizures and headaches.  Psychiatric/Behavioral: Negative for confusion and suicidal ideas.    Objective:    BP 138/73   Pulse 75   Ht 5\' 2"  (1.575 m)   Wt 179 lb (81.2 kg)   BMI 32.74 kg/m   Wt Readings from Last 3 Encounters:  02/27/19 179 lb (81.2 kg)  01/17/19 178 lb (80.7 kg)  05/19/18 155 lb (70.3 kg)  Physical Exam Constitutional:      Appearance: She is well-developed.  HENT:     Head: Normocephalic and atraumatic.  Neck:     Musculoskeletal: Normal range of motion and neck supple.     Thyroid: No thyromegaly.     Trachea: No tracheal deviation.  Cardiovascular:     Rate and Rhythm: Normal rate and regular rhythm.  Pulmonary:     Effort: Pulmonary effort is normal.     Breath sounds: Normal breath sounds.  Abdominal:     General: Bowel sounds are normal.     Palpations: Abdomen is soft.     Tenderness: There is no abdominal tenderness. There is no guarding.  Musculoskeletal: Normal range of motion.  Skin:    General: Skin is warm and dry.     Coloration: Skin is not pale.     Findings: No erythema or rash.  Neurological:     Mental Status: She is alert and oriented to person, place, and time.     Cranial Nerves: No cranial nerve deficit.     Coordination: Coordination normal.     Deep Tendon Reflexes: Reflexes are normal and symmetric.  Psychiatric:        Judgment: Judgment normal.     CMP     Component Value Date/Time   NA 140 02/19/2019 1207   K 3.8 02/19/2019 1207   CL 102 02/19/2019 1207   CO2 28 02/19/2019 1207   GLUCOSE 134 02/19/2019 1207   BUN 30 (H) 02/19/2019 1207   CREATININE 1.42 (H) 02/19/2019 1207   CALCIUM 9.6 02/19/2019 1207    PROT 6.8 02/19/2019 1207   ALBUMIN 3.4 (L) 05/29/2017 2044   AST 25 02/19/2019 1207   ALT 20 02/19/2019 1207   ALKPHOS 75 05/29/2017 2044   BILITOT 0.4 02/19/2019 1207   GFRNONAA 34 (L) 02/19/2019 1207   GFRAA 39 (L) 02/19/2019 1207    Diabetic Labs (most recent): Lab Results  Component Value Date   HGBA1C 7.6 (H) 02/19/2019   HGBA1C 8.0 (H) 05/05/2018   HGBA1C 8.6 (H) 01/31/2018      Lab Results  Component Value Date   TSH 1.29 01/31/2018   TSH 0.793 05/26/2017   FREET4 0.9 01/31/2018   Lipid Panel     Component Value Date/Time   CHOL 134 01/31/2018 0941   TRIG 115 01/31/2018 0941   HDL 39 (L) 01/31/2018 0941   CHOLHDL 3.4 01/31/2018 0941   LDLCALC 75 01/31/2018 0941      Assessment & Plan:   1. Type 2 diabetes mellitus with stage 3 chronic kidney disease, with long-term current use of insulin ([place])  - Aletha Halim Staples has currently uncontrolled symptomatic type 2 DM since 67 years of ago.  She is accompanied by her aide from nursing home. -Point-of-care A1c 7.6%, nursing home did not send her logs to review today.  We have requested for both logs to be sent in.    - Recent labs reviewed.  -her diabetes is complicated by stage 3 renal insufficiency and JOLIET MALLOZZI remains at a high risk for more acute and chronic complications which include CAD, CVA, CKD, retinopathy, and neuropathy. These are all discussed in detail with the patient.  - I have counseled her on diet management by adopting a carbohydrate restricted/protein rich diet.  - she  admits there is a room for improvement in her diet and drink choices. -  Suggestion is made for her to avoid simple carbohydrates  from her  diet including Cakes, Sweet Desserts / Pastries, Ice Cream, Soda (diet and regular), Sweet Tea, Candies, Chips, Cookies, Sweet Pastries,  Store Bought Juices, Alcohol in Excess of  1-2 drinks a day, Artificial Sweeteners, Coffee Creamer, and "Sugar-free" Products. This will help patient to  have stable blood glucose profile and potentially avoid unintended weight gain.   - I encouraged her to switch to  unprocessed or minimally processed complex starch and increased protein intake (animal or plant source), fruits, and vegetables.  - she is advised to stick to a routine mealtimes to eat 3 meals  a day and avoid unnecessary snacks ( to snack only to correct hypoglycemia).   - I have approached her with the following individualized plan to manage diabetes and patient agrees:   -She is elderly patient with several comorbidities, #1 priority and treating her diabetes would be to avoid hypoglycemia.    -Given her A1c of  7.6%, she will not need prandial insulin for now.  She is advised to continue Lantus 35 units nightly, associated with monitoring of blood glucose 2 times a day-daily before breakfast and at bedtime.   -Patient is encouraged to call clinic for blood glucose levels less than 70 or above 300 mg /dl.  -She is not a candidate for metformin, SGLT2 inhibitors. -She will continue to benefit from [place].  I discussed and continue Tradjenta 5 mg p.o. daily at breakfast. - Patient specific target  A1c;  LDL, HDL, Triglycerides, and  Waist Circumference were discussed in detail.  2) BP/HTN: Her blood pressure is controlled to target.  She is advised to continue current medications including losartan . 3) Lipids/HPL:   Her previsit show LDL of 75.  She will continue to benefit from statin therapy. She is advised to continue simvastatin 40 mg p.o. nightly.    4)  Weight/Diet: CDE Consult will be initiated , exercise, and detailed carbohydrates information provided.  5) vitamin D deficiency-new diagnosis for -She is continued on vitamin D supplement,  cholecalciferol 4000 units daily for 90 days.  6) Chronic Care/Health Maintenance:  -she  is on ACEI/ARB and Statin medications and  is encouraged to initiate and continue to follow up with Ophthalmology, Dentist,  Podiatrist  at least yearly or according to recommendations, and advised to  stay away from smoking. I have recommended yearly flu vaccine and pneumonia vaccine at least every 5 years; moderate intensity exercise for up to 150 minutes weekly; and  sleep for at least 7 hours a day.  - I advised patient to maintain close follow up with Rosita Fire, MD for primary care needs.  - Time spent with the patient: 25 min, of which >50% was spent in reviewing her blood glucose logs , discussing her hypoglycemia and hyperglycemia episodes, reviewing her current and  previous labs / studies and medications  doses and developing a plan to avoid hypoglycemia and hyperglycemia. Please refer to Patient Instructions for Blood Glucose Monitoring and Insulin/Medications Dosing Guide"  in media tab for additional information. Please  also refer to " Patient Self Inventory" in the Media  tab for reviewed elements of pertinent patient history.  Amy Stein participated in the discussions, expressed understanding, and voiced agreement with the above plans.  All questions were answered to her satisfaction. she is encouraged to contact clinic should she have any questions or concerns prior to her return visit.   Follow up plan: - Return in about 3 months (around 05/30/2019) for Bring Meter and Logs- A1c in  Office.  Glade Lloyd, MD [facility] [address] [place], [place] [address] Phone: [phone]  Fax: [phone]    02/27/2019, 11:49 AM  This note was partially dictated with voice recognition software. Similar sounding words can be transcribed inadequately or may not  be corrected upon review.

## 2019-03-05 ENCOUNTER — Telehealth: Payer: Self-pay | Admitting: "Endocrinology

## 2019-03-05 ENCOUNTER — Other Ambulatory Visit: Payer: Self-pay

## 2019-03-05 MED ORDER — LANTUS SOLOSTAR 100 UNIT/ML ~~LOC~~ SOPN: 35.0000 [IU] | PEN_INJECTOR | Freq: Every day | SUBCUTANEOUS | 1 refills | Status: DC

## 2019-03-05 NOTE — Telephone Encounter (Signed)
Rx sent 

## 2019-03-05 NOTE — Telephone Encounter (Signed)
Pt needs refill on Lantus

## 2019-03-05 NOTE — Telephone Encounter (Signed)
[facility]

## 2019-04-09 ENCOUNTER — Telehealth: Payer: Self-pay | Admitting: Adult Health

## 2019-04-09 NOTE — Telephone Encounter (Signed)
Tried to reach the patient to remind her of her appointment/restrictions.  Spoke w/her grandson.

## 2019-04-10 ENCOUNTER — Encounter: Payer: Self-pay | Admitting: Adult Health

## 2019-04-10 ENCOUNTER — Other Ambulatory Visit: Payer: Self-pay

## 2019-04-10 ENCOUNTER — Ambulatory Visit (INDEPENDENT_AMBULATORY_CARE_PROVIDER_SITE_OTHER): Payer: 59 | Admitting: Adult Health

## 2019-04-10 VITALS — BP 126/75 | HR 73 | Ht 60.0 in | Wt 173.5 lb

## 2019-04-10 DIAGNOSIS — Z1212 Encounter for screening for malignant neoplasm of rectum: Secondary | ICD-10-CM

## 2019-04-10 DIAGNOSIS — Z01419 Encounter for gynecological examination (general) (routine) without abnormal findings: Secondary | ICD-10-CM

## 2019-04-10 DIAGNOSIS — Z1211 Encounter for screening for malignant neoplasm of colon: Secondary | ICD-10-CM

## 2019-04-10 LAB — HEMOCCULT GUIAC POC 1CARD (OFFICE): Fecal Occult Blood, POC: NEGATIVE

## 2019-04-10 NOTE — Progress Notes (Signed)
Patient ID: Amy Stein, female   DOB: 05/26/1933, 85 y.o.   MRN: [id number] History of Present Illness: Amy Stein is a 53 year old black female, single, PM with an older friend, in for a well woman gyn exam,she had a normal pap with negative HPV 01/16/18.  PCP is Dr Legrand Rams    Current Medications, Allergies, Past Medical History, Past Surgical History, Family History and Social History were reviewed in [place] record.     Review of Systems: Patient denies any headaches, hearing loss, fatigue, blurred vision, shortness of breath, chest pain, abdominal pain, problems with bowel movements, urination, or intercourse. No joint pain or mood swings.    Physical Exam:BP 126/75 (BP Location: Left Arm, Patient Position: Sitting, Cuff Size: Normal)   Pulse 73   Ht 5' (1.524 m)   Wt 173 lb 8 oz (78.7 kg)   BMI 33.88 kg/m  General:  Well developed, well nourished, no acute distress Skin:  Warm and dry Neck:  Midline trachea, normal thyroid, good ROM, no lymphadenopathy,no carotid bruits heard  Lungs; Clear to auscultation bilaterally Breast:  No dominant palpable mass, retraction, or nipple discharge Cardiovascular: Regular rate and rhythm Abdomen:  Soft, non tender, no hepatosplenomegaly,has bruise near navel where she got insulin injection  Pelvic:  External genitalia is normal in appearance, no lesions.  The vagina is pale, with loss of moisture and rugae. Urethra has no lesions or masses. The cervix is smooth.  Uterus is felt to be normal size, shape, and contour.  No adnexal masses or tenderness noted.Bladder is non tender, no masses felt. Rectal: Good sphincter tone, no polyps, or hemorrhoids felt.  Hemoccult negative. Extremities/musculoskeletal:  No swelling or varicosities noted, no clubbing or cyanosis Psych:  No mood changes, alert and cooperative,seems happy Fall risk is low PHQ 2 score is 0 Examination chaperoned by Levy Pupa LPN.  Impression and Plan:  1.  Encounter for well woman exam with routine gynecological exam Physical in 1 year at her request Labs with PCP Mammogram yearly Pap in 2022  2. Screening for colorectal cancer

## 2019-04-24 ENCOUNTER — Ambulatory Visit: Payer: 59 | Attending: Internal Medicine

## 2019-04-24 ENCOUNTER — Other Ambulatory Visit: Payer: Self-pay

## 2019-04-24 DIAGNOSIS — Z20822 Contact with and (suspected) exposure to covid-19: Secondary | ICD-10-CM

## 2019-04-25 ENCOUNTER — Telehealth: Payer: Self-pay | Admitting: *Deleted

## 2019-04-25 LAB — NOVEL CORONAVIRUS, NAA: SARS-CoV-2, NAA: NOT DETECTED

## 2019-04-25 NOTE — Telephone Encounter (Signed)
Calling for Covid 19 results. Not available at this time.

## 2019-04-27 ENCOUNTER — Telehealth: Payer: Self-pay | Admitting: *Deleted

## 2019-04-27 NOTE — Telephone Encounter (Signed)
Pt given result of COVID test; she verbalized understanding. 

## 2019-05-09 ENCOUNTER — Other Ambulatory Visit: Payer: Self-pay | Admitting: Internal Medicine

## 2019-05-09 DIAGNOSIS — N183 Chronic kidney disease, stage 3 unspecified: Secondary | ICD-10-CM

## 2019-05-15 ENCOUNTER — Other Ambulatory Visit: Payer: Self-pay

## 2019-05-15 ENCOUNTER — Ambulatory Visit (HOSPITAL_COMMUNITY)
Admission: RE | Admit: 2019-05-15 | Discharge: 2019-05-15 | Disposition: A | Discharge status: Home / Self Care | Payer: 59 | Source: Ambulatory Visit | Attending: Internal Medicine | Admitting: Internal Medicine

## 2019-05-15 DIAGNOSIS — N183 Chronic kidney disease, stage 3 unspecified: Secondary | ICD-10-CM | POA: Insufficient documentation

## 2019-05-30 ENCOUNTER — Ambulatory Visit (INDEPENDENT_AMBULATORY_CARE_PROVIDER_SITE_OTHER): Payer: Medicare Other | Admitting: "Endocrinology

## 2019-05-30 ENCOUNTER — Encounter: Payer: Self-pay | Admitting: "Endocrinology

## 2019-05-30 ENCOUNTER — Other Ambulatory Visit: Payer: Self-pay

## 2019-05-30 VITALS — BP 109/70 | HR 82 | Ht 60.0 in | Wt 164.6 lb

## 2019-05-30 DIAGNOSIS — N183 Chronic kidney disease, stage 3 unspecified: Secondary | ICD-10-CM

## 2019-05-30 DIAGNOSIS — E1122 Type 2 diabetes mellitus with diabetic chronic kidney disease: Secondary | ICD-10-CM | POA: Diagnosis not present

## 2019-05-30 DIAGNOSIS — I10 Essential (primary) hypertension: Secondary | ICD-10-CM

## 2019-05-30 DIAGNOSIS — Z794 Long term (current) use of insulin: Secondary | ICD-10-CM

## 2019-05-30 DIAGNOSIS — E782 Mixed hyperlipidemia: Secondary | ICD-10-CM | POA: Diagnosis not present

## 2019-05-30 LAB — POCT GLYCOSYLATED HEMOGLOBIN (HGB A1C): Hemoglobin A1C: 7.5 % — AB (ref 4.0–5.6)

## 2019-05-30 NOTE — Patient Instructions (Signed)

## 2019-05-30 NOTE — Progress Notes (Signed)
Endocrinology follow-up  Note       05/30/2019, 12:58 PM   Subjective:    Patient ID: Amy Stein, female    DOB: May 18, 1933.  Amy Stein is being seen in follow-up  for management of currently uncontrolled symptomatic type 2 diabetes, hypertension, hyperlipidemia. PMD:   Rosita Fire, MD.   Past Medical History:  Diagnosis Date  . Diabetes mellitus   . Hypertension   . Shingles    on both legs 1/18   Past Surgical History:  Procedure Laterality Date  . COLONOSCOPY    . COLONOSCOPY N/A 02/23/2017   Procedure: COLONOSCOPY;  Surgeon: Daneil Dolin, MD;  Location: [facility];  Service: Endoscopy;  Laterality: N/A;  9:30 Am  . POLYPECTOMY  02/23/2017   Procedure: POLYPECTOMY;  Surgeon: Daneil Dolin, MD;  Location: [facility];  Service: Endoscopy;;  cecum   Social History   Socioeconomic History  . Marital status: Single    Spouse name: Not on file  . Number of children: Not on file  . Years of education: Not on file  . Highest education level: Not on file  Occupational History  . Not on file  Tobacco Use  . Smoking status: Former Research scientist (life sciences)  . Smokeless tobacco: Never Used  Substance and Sexual Activity  . Alcohol use: No  . Drug use: No  . Sexual activity: Yes    Birth control/protection: Post-menopausal  Other Topics Concern  . Not on file  Social History Narrative  . Not on file   Social Determinants of Health   Financial Resource Strain:   . Difficulty of Paying Living Expenses: Not on file  Food Insecurity:   . Worried About Charity fundraiser in the Last Year: Not on file  . Ran Out of Food in the Last Year: Not on file  Transportation Needs:   . Lack of Transportation (Medical): Not on file  . Lack of Transportation (Non-Medical): Not on file  Physical Activity:   . Days of Exercise per Week: Not on file  . Minutes of Exercise per Session: Not on file  Stress:    . Feeling of Stress : Not on file  Social Connections:   . Frequency of Communication with Friends and Family: Not on file  . Frequency of Social Gatherings with Friends and Family: Not on file  . Attends Religious Services: Not on file  . Active Member of Clubs or Organizations: Not on file  . Attends Archivist Meetings: Not on file  . Marital Status: Not on file   Outpatient Encounter Medications as of 05/30/2019  Medication Sig  . aspirin EC 81 MG tablet Take 81 mg by mouth daily.  . ferrous sulfate 325 (65 FE) MG tablet Take 1 tablet (325 mg total) by mouth 2 (two) times daily with a meal.  . gabapentin (NEURONTIN) 400 MG capsule Take 400 mg by mouth 3 (three) times daily.   Marland Kitchen HM VITAMIN D3 100 MCG (4000 UT) CAPS TAKE ONE CAPSULE (5000 UNITS TOTAL) BY MOUTH DAILY  . hydrochlorothiazide (MICROZIDE) 12.5 MG capsule Take 12.5 mg by mouth  daily.  . Insulin Glargine (LANTUS SOLOSTAR) 100 UNIT/ML Solostar Pen Inject 35 Units into the skin at bedtime.  . Insulin Pen Needle (PEN NEEDLES) 32G X 4 MM MISC 1 each by Does not apply route at bedtime.  Marland Kitchen labetalol (NORMODYNE) 100 MG tablet Take 100 mg by mouth 2 (two) times daily.   Marland Kitchen losartan (COZAAR) 100 MG tablet Take 100 mg by mouth daily.  . medroxyPROGESTERone (PROVERA) 10 MG tablet Take 1 tablet (10 mg total) by mouth See admin instructions. Take 10 mg by mouth daily for the first 10 days of the month  . naproxen (NAPROSYN) 500 MG tablet Take 1 tablet (500 mg total) by mouth 2 (two) times daily with a meal.  . omeprazole (PRILOSEC) 20 MG capsule Take 20 mg by mouth daily.  Marland Kitchen oxybutynin (DITROPAN) 5 MG tablet Take 5 mg by mouth daily.  . simvastatin (ZOCOR) 40 MG tablet Take 40 mg by mouth at bedtime.   . vitamin B-12 1000 MCG tablet Take 1 tablet (1,000 mcg total) by mouth daily.   No facility-administered encounter medications on file as of 05/30/2019.    ALLERGIES: Allergies  Allergen Reactions  . Sulfa Antibiotics   .  Sulfonamide Derivatives Rash    VACCINATION STATUS:  There is no immunization history on file for this patient.  Diabetes She presents for her follow-up diabetic visit. She has type 2 diabetes mellitus. Onset time: She was diagnosed at approximate age of 41 years. Her disease course has been improving. There are no hypoglycemic associated symptoms. Pertinent negatives for hypoglycemia include no confusion, headaches, pallor or seizures. There are no diabetic associated symptoms. Pertinent negatives for diabetes include no chest pain, no polydipsia, no polyphagia and no polyuria. There are no hypoglycemic complications. Symptoms are improving. Diabetic complications include nephropathy. Risk factors for coronary artery disease include diabetes mellitus, hypertension, sedentary lifestyle and tobacco exposure. Current diabetic treatment includes insulin injections. Her weight is decreasing steadily. She is following a generally unhealthy diet. When asked about meal planning, she reported none. She has not had a previous visit with a dietitian. She rarely participates in exercise. Her home blood glucose trend is fluctuating minimally. Her breakfast blood glucose range is generally 140-180 mg/dl. Her overall blood glucose range is 140-180 mg/dl. (She is returning with controlled glycemic profile both fasting and postprandial.  And her A1c of 7.5%, progressively improving from 8.6%.    ) An ACE inhibitor/angiotensin II receptor blocker is being taken. Eye exam is current.  Hypertension This is a chronic problem. The current episode started more than 1 year ago. The problem is controlled. Pertinent negatives include no chest pain, headaches, palpitations or shortness of breath. Risk factors for coronary artery disease include diabetes mellitus, sedentary lifestyle, smoking/tobacco exposure and post-menopausal state. Past treatments include angiotensin blockers. Hypertensive end-organ damage includes kidney  disease. Identifiable causes of hypertension include chronic renal disease.    Review of Systems  Constitutional: Negative for chills, fever and unexpected weight change.  HENT: Negative for trouble swallowing and voice change.   Eyes: Negative for visual disturbance.  Respiratory: Negative for cough, shortness of breath and wheezing.   Cardiovascular: Negative for chest pain, palpitations and leg swelling.  Gastrointestinal: Negative for diarrhea, nausea and vomiting.  Endocrine: Negative for cold intolerance, heat intolerance, polydipsia, polyphagia and polyuria.  Musculoskeletal: Negative for arthralgias and myalgias.  Skin: Negative for color change, pallor, rash and wound.  Neurological: Negative for seizures and headaches.  Psychiatric/Behavioral: Negative for confusion and suicidal  ideas.    Objective:    BP 109/70   Pulse 82   Ht 5' (1.524 m)   Wt 164 lb 9.6 oz (74.7 kg)   BMI 32.15 kg/m   Wt Readings from Last 3 Encounters:  05/30/19 164 lb 9.6 oz (74.7 kg)  04/10/19 173 lb 8 oz (78.7 kg)  02/27/19 179 lb (81.2 kg)     Physical Exam Constitutional:      Appearance: She is well-developed.  HENT:     Head: Normocephalic and atraumatic.  Neck:     Thyroid: No thyromegaly.     Trachea: No tracheal deviation.  Cardiovascular:     Rate and Rhythm: Normal rate and regular rhythm.  Pulmonary:     Effort: Pulmonary effort is normal.     Breath sounds: Normal breath sounds.  Abdominal:     General: Bowel sounds are normal.     Palpations: Abdomen is soft.     Tenderness: There is no abdominal tenderness. There is no guarding.  Musculoskeletal:        General: Normal range of motion.     Cervical back: Normal range of motion and neck supple.  Skin:    General: Skin is warm and dry.     Coloration: Skin is not pale.     Findings: No erythema or rash.  Neurological:     Mental Status: She is alert and oriented to person, place, and time.     Cranial Nerves: No  cranial nerve deficit.     Coordination: Coordination normal.     Deep Tendon Reflexes: Reflexes are normal and symmetric.  Psychiatric:        Judgment: Judgment normal.     CMP     Component Value Date/Time   NA 140 02/19/2019 1207   K 3.8 02/19/2019 1207   CL 102 02/19/2019 1207   CO2 28 02/19/2019 1207   GLUCOSE 134 02/19/2019 1207   BUN 30 (H) 02/19/2019 1207   CREATININE 1.42 (H) 02/19/2019 1207   CALCIUM 9.6 02/19/2019 1207   PROT 6.8 02/19/2019 1207   ALBUMIN 3.4 (L) 05/29/2017 2044   AST 25 02/19/2019 1207   ALT 20 02/19/2019 1207   ALKPHOS 75 05/29/2017 2044   BILITOT 0.4 02/19/2019 1207   GFRNONAA 34 (L) 02/19/2019 1207   GFRAA 39 (L) 02/19/2019 1207    Diabetic Labs (most recent): Lab Results  Component Value Date   HGBA1C 7.5 (A) 05/30/2019   HGBA1C 7.6 (H) 02/19/2019   HGBA1C 8.0 (H) 05/05/2018      Lab Results  Component Value Date   TSH 1.29 01/31/2018   TSH 0.793 05/26/2017   FREET4 0.9 01/31/2018   Lipid Panel     Component Value Date/Time   CHOL 134 01/31/2018 0941   TRIG 115 01/31/2018 0941   HDL 39 (L) 01/31/2018 0941   CHOLHDL 3.4 01/31/2018 0941   LDLCALC 75 01/31/2018 0941      Assessment & Plan:   1. Type 2 diabetes mellitus with stage 3 chronic kidney disease, with long-term current use of insulin ([place])  - Amy Stein has currently uncontrolled symptomatic type 2 DM since 67 years of ago.  She is returning with controlled glycemic profile to near target levels and A1c at point-of-care was 7.5%, improving from 8.6%.   - Recent labs reviewed.  -her diabetes is complicated by stage 3 renal insufficiency and Amy Stein remains at a high risk for more acute and chronic complications which  include CAD, CVA, CKD, retinopathy, and neuropathy. These are all discussed in detail with the patient.  - I have counseled her on diet management by adopting a carbohydrate restricted/protein rich diet.  - she  admits there is a room for  improvement in her diet and drink choices. -  Suggestion is made for her to avoid simple carbohydrates  from her diet including Cakes, Sweet Desserts / Pastries, Ice Cream, Soda (diet and regular), Sweet Tea, Candies, Chips, Cookies, Sweet Pastries,  Store Bought Juices, Alcohol in Excess of  1-2 drinks a day, Artificial Sweeteners, Coffee Creamer, and "Sugar-free" Products. This will help patient to have stable blood glucose profile and potentially avoid unintended weight gain.  - I encouraged her to switch to  unprocessed or minimally processed complex starch and increased protein intake (animal or plant source), fruits, and vegetables.  - she is advised to stick to a routine mealtimes to eat 3 meals  a day and avoid unnecessary snacks ( to snack only to correct hypoglycemia).   - I have approached her with the following individualized plan to manage diabetes and patient agrees:   -She is elderly patient with several comorbidities, #1 priority and treating her diabetes would be to avoid hypoglycemia.    -Given her A1c of  7.5%, she will not need prandial insulin for now.  She is advised to continue Lantus 35 units nightly, associated with monitoring of blood glucose 2 times a day-daily before breakfast and at bedtime.   -Patient is encouraged to call clinic for blood glucose levels less than 70 or above 300 mg /dl.  -She is not a candidate for metformin, SGLT2 inhibitors. -She will be considered for low-dose glipizide if she loses control during subsequent visits. -- Patient specific target  A1c;  LDL, HDL, Triglycerides, and  Waist Circumference were discussed in detail.  2) BP/HTN: Her blood pressure is controlled to target.  She is advised to continue current medications including losartan . 3) Lipids/HPL:   Her previsit show LDL of 75.  She will continue to benefit from statin therapy. She is advised to continue simvastatin 40 mg p.o. nightly.      4)  Weight/Diet: CDE Consult will be  initiated , exercise, and detailed carbohydrates information provided.  5) vitamin D deficiency-new diagnosis for -She is continued on vitamin D supplement,  cholecalciferol 4000 units daily for 90 days.  6) Chronic Care/Health Maintenance:  -she  is on ACEI/ARB and Statin medications and  is encouraged to initiate and continue to follow up with Ophthalmology, Dentist,  Podiatrist at least yearly or according to recommendations, and advised to  stay away from smoking. I have recommended yearly flu vaccine and pneumonia vaccine at least every 5 years; moderate intensity exercise for up to 150 minutes weekly; and  sleep for at least 7 hours a day.  - I advised patient to maintain close follow up with Rosita Fire, MD for primary care needs.  - Time spent on this patient care encounter:  35 min, of which > 50% was spent in  counseling and the rest reviewing her blood glucose logs , discussing her hypoglycemia and hyperglycemia episodes, reviewing her current and  previous labs / studies  ( including abstraction from other facilities) and medications  doses and developing a  long term treatment plan and documenting her care.   Please refer to Patient Instructions for Blood Glucose Monitoring and Insulin/Medications Dosing Guide"  in media tab for additional information. Please  also refer  to " Patient Self Inventory" in the Media  tab for reviewed elements of pertinent patient history.  Amy Stein participated in the discussions, expressed understanding, and voiced agreement with the above plans.  All questions were answered to her satisfaction. she is encouraged to contact clinic should she have any questions or concerns prior to her return visit.    Follow up plan: - Return in about 4 months (around 09/27/2019) for Bring Meter and Logs- A1c in Office, Follow up with Pre-visit Labs.  Glade Lloyd, MD [facility] [address] [place], [place] [address] Phone: [phone]  Fax: [phone]    05/30/2019, 12:58 PM  This note was partially dictated with voice recognition software. Similar sounding words can be transcribed inadequately or may not  be corrected upon review.

## 2019-05-31 DIAGNOSIS — G301 Alzheimer's disease with late onset: Secondary | ICD-10-CM | POA: Diagnosis not present

## 2019-06-07 DIAGNOSIS — E785 Hyperlipidemia, unspecified: Secondary | ICD-10-CM | POA: Diagnosis not present

## 2019-06-07 DIAGNOSIS — E1142 Type 2 diabetes mellitus with diabetic polyneuropathy: Secondary | ICD-10-CM | POA: Diagnosis not present

## 2019-07-06 ENCOUNTER — Other Ambulatory Visit (HOSPITAL_COMMUNITY): Payer: Self-pay | Admitting: Nephrology

## 2019-07-06 DIAGNOSIS — N189 Chronic kidney disease, unspecified: Secondary | ICD-10-CM

## 2019-07-19 ENCOUNTER — Ambulatory Visit (HOSPITAL_COMMUNITY)
Admission: RE | Admit: 2019-07-19 | Discharge: 2019-07-19 | Disposition: A | Discharge status: Home / Self Care | Payer: 59 | Source: Ambulatory Visit | Attending: Nephrology | Admitting: Nephrology

## 2019-07-19 ENCOUNTER — Other Ambulatory Visit: Payer: Self-pay

## 2019-07-19 DIAGNOSIS — N189 Chronic kidney disease, unspecified: Secondary | ICD-10-CM | POA: Diagnosis not present

## 2019-09-18 DIAGNOSIS — E1122 Type 2 diabetes mellitus with diabetic chronic kidney disease: Secondary | ICD-10-CM | POA: Diagnosis not present

## 2019-09-18 DIAGNOSIS — N183 Chronic kidney disease, stage 3 unspecified: Secondary | ICD-10-CM | POA: Diagnosis not present

## 2019-09-18 DIAGNOSIS — Z794 Long term (current) use of insulin: Secondary | ICD-10-CM | POA: Diagnosis not present

## 2019-09-18 DIAGNOSIS — E559 Vitamin D deficiency, unspecified: Secondary | ICD-10-CM | POA: Diagnosis not present

## 2019-09-19 LAB — COMPLETE METABOLIC PANEL WITH GFR
AG Ratio: 1.4 (calc) (ref 1.0–2.5)
ALT: 16 U/L (ref 6–29)
AST: 26 U/L (ref 10–35)
Albumin: 4.2 g/dL (ref 3.6–5.1)
Alkaline phosphatase (APISO): 76 U/L (ref 37–153)
BUN/Creatinine Ratio: 24 (calc) — ABNORMAL HIGH (ref 6–22)
BUN: 47 mg/dL — ABNORMAL HIGH (ref 7–25)
CO2: 27 mmol/L (ref 20–32)
Calcium: 10.1 mg/dL (ref 8.6–10.4)
Chloride: 102 mmol/L (ref 98–110)
Creat: 2 mg/dL — ABNORMAL HIGH (ref 0.60–0.88)
GFR, Est African American: 26 mL/min/{1.73_m2} — ABNORMAL LOW (ref >=60)
GFR, Est Non African American: 23 mL/min/{1.73_m2} — ABNORMAL LOW (ref >=60)
Globulin: 2.9 g/dL (calc) (ref 1.9–3.7)
Glucose, Bld: 147 mg/dL — ABNORMAL HIGH (ref 65–99)
Potassium: 3.6 mmol/L (ref 3.5–5.3)
Sodium: 141 mmol/L (ref 135–146)
Total Bilirubin: 0.4 mg/dL (ref 0.2–1.2)
Total Protein: 7.1 g/dL (ref 6.1–8.1)

## 2019-09-19 LAB — MICROALBUMIN / CREATININE URINE RATIO
Creatinine, Urine: 248 mg/dL (ref 20–275)
Microalb Creat Ratio: 10 mcg/mg creat (ref <30)
Microalb, Ur: 2.6 mg/dL

## 2019-09-19 LAB — VITAMIN D 25 HYDROXY (VIT D DEFICIENCY, FRACTURES): Vit D, 25-Hydroxy: 73 ng/mL (ref 30–100)

## 2019-09-19 LAB — LIPID PANEL
Cholesterol: 129 mg/dL (ref <200)
HDL: 39 mg/dL — ABNORMAL LOW (ref >=50)
LDL Cholesterol (Calc): 67 mg/dL (calc)
Non-HDL Cholesterol (Calc): 90 mg/dL (calc) (ref <130)
Total CHOL/HDL Ratio: 3.3 (calc) (ref <5.0)
Triglycerides: 143 mg/dL (ref <150)

## 2019-09-19 LAB — T4, FREE: Free T4: 0.9 ng/dL (ref 0.8–1.8)

## 2019-09-19 LAB — TSH: TSH: 1.68 mIU/L (ref 0.40–4.50)

## 2019-09-27 ENCOUNTER — Other Ambulatory Visit: Payer: Self-pay

## 2019-09-27 ENCOUNTER — Ambulatory Visit (INDEPENDENT_AMBULATORY_CARE_PROVIDER_SITE_OTHER): Payer: Medicare Other | Admitting: "Endocrinology

## 2019-09-27 ENCOUNTER — Encounter: Payer: Self-pay | Admitting: "Endocrinology

## 2019-09-27 VITALS — BP 103/58 | HR 82 | Ht 60.0 in | Wt 166.2 lb

## 2019-09-27 DIAGNOSIS — E782 Mixed hyperlipidemia: Secondary | ICD-10-CM

## 2019-09-27 DIAGNOSIS — E1122 Type 2 diabetes mellitus with diabetic chronic kidney disease: Secondary | ICD-10-CM

## 2019-09-27 DIAGNOSIS — I10 Essential (primary) hypertension: Secondary | ICD-10-CM | POA: Diagnosis not present

## 2019-09-27 DIAGNOSIS — E559 Vitamin D deficiency, unspecified: Secondary | ICD-10-CM

## 2019-09-27 DIAGNOSIS — N183 Chronic kidney disease, stage 3 unspecified: Secondary | ICD-10-CM

## 2019-09-27 DIAGNOSIS — Z794 Long term (current) use of insulin: Secondary | ICD-10-CM

## 2019-09-27 LAB — POCT GLYCOSYLATED HEMOGLOBIN (HGB A1C): Hemoglobin A1C: 7.1 % — AB (ref 4.0–5.6)

## 2019-09-27 NOTE — Patient Instructions (Signed)

## 2019-09-27 NOTE — Progress Notes (Signed)
Endocrinology follow-up  Note       09/27/2019, 2:42 PM   Subjective:    Patient ID: Amy Stein, female    DOB: 1933/05/13.  Amy Stein is being seen in follow-up  for management of currently uncontrolled symptomatic type 2 diabetes, hypertension, hyperlipidemia. PMD:   Rosita Fire, MD.   Past Medical History:  Diagnosis Date  . Diabetes mellitus   . Hypertension   . Shingles    on both legs 1/18   Past Surgical History:  Procedure Laterality Date  . COLONOSCOPY    . COLONOSCOPY N/A 02/23/2017   Procedure: COLONOSCOPY;  Surgeon: Daneil Dolin, MD;  Location: [facility];  Service: Endoscopy;  Laterality: N/A;  9:30 Am  . POLYPECTOMY  02/23/2017   Procedure: POLYPECTOMY;  Surgeon: Daneil Dolin, MD;  Location: [facility];  Service: Endoscopy;;  cecum   Social History   Socioeconomic History  . Marital status: Single    Spouse name: Not on file  . Number of children: Not on file  . Years of education: Not on file  . Highest education level: Not on file  Occupational History  . Not on file  Tobacco Use  . Smoking status: Former Research scientist (life sciences)  . Smokeless tobacco: Never Used  Vaping Use  . Vaping Use: Never used  Substance and Sexual Activity  . Alcohol use: No  . Drug use: No  . Sexual activity: Yes    Birth control/protection: Post-menopausal  Other Topics Concern  . Not on file  Social History Narrative  . Not on file   Social Determinants of Health   Financial Resource Strain:   . Difficulty of Paying Living Expenses:   Food Insecurity:   . Worried About Charity fundraiser in the Last Year:   . Arboriculturist in the Last Year:   Transportation Needs:   . Film/video editor (Medical):   Marland Kitchen Lack of Transportation (Non-Medical):   Physical Activity:   . Days of Exercise per Week:   . Minutes of Exercise per Session:   Stress:   . Feeling of Stress :   Social  Connections:   . Frequency of Communication with Friends and Family:   . Frequency of Social Gatherings with Friends and Family:   . Attends Religious Services:   . Active Member of Clubs or Organizations:   . Attends Archivist Meetings:   Marland Kitchen Marital Status:    Outpatient Encounter Medications as of 09/27/2019  Medication Sig  . aspirin EC 81 MG tablet Take 81 mg by mouth daily.  . ferrous sulfate 325 (65 FE) MG tablet Take 1 tablet (325 mg total) by mouth 2 (two) times daily with a meal.  . gabapentin (NEURONTIN) 400 MG capsule Take 400 mg by mouth 3 (three) times daily.   Marland Kitchen HM VITAMIN D3 100 MCG (4000 UT) CAPS TAKE ONE CAPSULE (5000 UNITS TOTAL) BY MOUTH DAILY  . hydrochlorothiazide (MICROZIDE) 12.5 MG capsule Take 12.5 mg by mouth daily.  . Insulin Glargine (LANTUS SOLOSTAR) 100 UNIT/ML Solostar Pen Inject 35 Units into the skin at bedtime.  Marland Kitchen  Insulin Pen Needle (PEN NEEDLES) 32G X 4 MM MISC 1 each by Does not apply route at bedtime.  Marland Kitchen labetalol (NORMODYNE) 100 MG tablet Take 100 mg by mouth 2 (two) times daily.   Marland Kitchen losartan (COZAAR) 100 MG tablet Take 100 mg by mouth daily.  . medroxyPROGESTERone (PROVERA) 10 MG tablet Take 1 tablet (10 mg total) by mouth See admin instructions. Take 10 mg by mouth daily for the first 10 days of the month  . naproxen (NAPROSYN) 500 MG tablet Take 1 tablet (500 mg total) by mouth 2 (two) times daily with a meal.  . omeprazole (PRILOSEC) 20 MG capsule Take 20 mg by mouth daily.  Marland Kitchen oxybutynin (DITROPAN) 5 MG tablet Take 5 mg by mouth daily.  . simvastatin (ZOCOR) 40 MG tablet Take 40 mg by mouth at bedtime.   . vitamin B-12 1000 MCG tablet Take 1 tablet (1,000 mcg total) by mouth daily.   No facility-administered encounter medications on file as of 09/27/2019.    ALLERGIES: Allergies  Allergen Reactions  . Sulfa Antibiotics   . Sulfonamide Derivatives Rash    VACCINATION STATUS:  There is no immunization history on file for this  patient.  Diabetes She presents for her follow-up diabetic visit. She has type 2 diabetes mellitus. Onset time: She was diagnosed at approximate age of 39 years. Her disease course has been improving. There are no hypoglycemic associated symptoms. Pertinent negatives for hypoglycemia include no confusion, headaches, pallor or seizures. There are no diabetic associated symptoms. Pertinent negatives for diabetes include no chest pain, no polydipsia, no polyphagia and no polyuria. There are no hypoglycemic complications. Symptoms are improving. Diabetic complications include nephropathy. Risk factors for coronary artery disease include diabetes mellitus, hypertension, sedentary lifestyle and tobacco exposure. Current diabetic treatment includes insulin injections. Her weight is fluctuating minimally. She is following a generally unhealthy diet. When asked about meal planning, she reported none. She has not had a previous visit with a dietitian. She rarely participates in exercise. Her home blood glucose trend is fluctuating minimally. Her breakfast blood glucose range is generally 130-140 mg/dl. Her overall blood glucose range is 140-180 mg/dl. (She is returning with controlled glycemic profile both fasting and postprandial.  Her point-of-care A1c 7.1%, improving from 8.6%.  She did not document or report any hypoglycemia.   ) An ACE inhibitor/angiotensin II receptor blocker is being taken. Eye exam is current.  Hypertension This is a chronic problem. The current episode started more than 1 year ago. The problem is controlled. Pertinent negatives include no chest pain, headaches, palpitations or shortness of breath. Risk factors for coronary artery disease include diabetes mellitus, sedentary lifestyle, smoking/tobacco exposure and post-menopausal state. Past treatments include angiotensin blockers. Hypertensive end-organ damage includes kidney disease. Identifiable causes of hypertension include chronic renal  disease.    Review of Systems  Constitutional: Negative for chills, fever and unexpected weight change.  HENT: Negative for trouble swallowing and voice change.   Eyes: Negative for visual disturbance.  Respiratory: Negative for cough, shortness of breath and wheezing.   Cardiovascular: Negative for chest pain, palpitations and leg swelling.  Gastrointestinal: Negative for diarrhea, nausea and vomiting.  Endocrine: Negative for cold intolerance, heat intolerance, polydipsia, polyphagia and polyuria.  Musculoskeletal: Negative for arthralgias and myalgias.  Skin: Negative for color change, pallor, rash and wound.  Neurological: Negative for seizures and headaches.  Psychiatric/Behavioral: Negative for confusion and suicidal ideas.    Objective:    BP (!) 103/58   Pulse 82  Ht 5' (1.524 m)   Wt 166 lb 3.2 oz (75.4 kg)   BMI 32.46 kg/m   Wt Readings from Last 3 Encounters:  09/27/19 166 lb 3.2 oz (75.4 kg)  05/30/19 164 lb 9.6 oz (74.7 kg)  04/10/19 173 lb 8 oz (78.7 kg)     Physical Exam Constitutional:      Appearance: She is well-developed.  HENT:     Head: Normocephalic and atraumatic.  Neck:     Thyroid: No thyromegaly.     Trachea: No tracheal deviation.  Cardiovascular:     Rate and Rhythm: Normal rate and regular rhythm.  Pulmonary:     Effort: Pulmonary effort is normal.     Breath sounds: Normal breath sounds.  Abdominal:     General: Bowel sounds are normal.     Palpations: Abdomen is soft.     Tenderness: There is no abdominal tenderness. There is no guarding.  Musculoskeletal:        General: Normal range of motion.     Cervical back: Normal range of motion and neck supple.  Skin:    General: Skin is warm and dry.     Coloration: Skin is not pale.     Findings: No erythema or rash.  Neurological:     Mental Status: She is alert and oriented to person, place, and time.     Cranial Nerves: No cranial nerve deficit.     Coordination: Coordination  normal.     Deep Tendon Reflexes: Reflexes are normal and symmetric.  Psychiatric:        Judgment: Judgment normal.     CMP     Component Value Date/Time   NA 141 09/18/2019 0848   K 3.6 09/18/2019 0848   CL 102 09/18/2019 0848   CO2 27 09/18/2019 0848   GLUCOSE 147 (H) 09/18/2019 0848   BUN 47 (H) 09/18/2019 0848   CREATININE 2.00 (H) 09/18/2019 0848   CALCIUM 10.1 09/18/2019 0848   PROT 7.1 09/18/2019 0848   ALBUMIN 3.4 (L) 05/29/2017 2044   AST 26 09/18/2019 0848   ALT 16 09/18/2019 0848   ALKPHOS 75 05/29/2017 2044   BILITOT 0.4 09/18/2019 0848   GFRNONAA 23 (L) 09/18/2019 0848   GFRAA 26 (L) 09/18/2019 0848    Diabetic Labs (most recent): Lab Results  Component Value Date   HGBA1C 7.1 (A) 09/27/2019   HGBA1C 7.5 (A) 05/30/2019   HGBA1C 7.6 (H) 02/19/2019      Lab Results  Component Value Date   TSH 1.68 09/18/2019   TSH 1.29 01/31/2018   TSH 0.793 05/26/2017   FREET4 0.9 09/18/2019   FREET4 0.9 01/31/2018   Lipid Panel     Component Value Date/Time   CHOL 129 09/18/2019 0848   TRIG 143 09/18/2019 0848   HDL 39 (L) 09/18/2019 0848   CHOLHDL 3.3 09/18/2019 0848   LDLCALC 67 09/18/2019 0848      Assessment & Plan:   1. Type 2 diabetes mellitus with stage 3 chronic kidney disease, with long-term current use of insulin ([place])  - Aletha Halim Andres has currently uncontrolled symptomatic type 2 DM since 67 years of ago.   She is returning with controlled glycemic profile both fasting and postprandial.  Her point-of-care A1c 7.1%, improving from 8.6%.  She did not document or report any hypoglycemia.    - Recent labs reviewed.  -her diabetes is complicated by stage 3 renal insufficiency and URSALA CRESSY remains at a high risk  for more acute and chronic complications which include CAD, CVA, CKD, retinopathy, and neuropathy. These are all discussed in detail with the patient.  - I have counseled her on diet management by adopting a carbohydrate  restricted/protein rich diet.  - she  admits there is a room for improvement in her diet and drink choices. -  Suggestion is made for her to avoid simple carbohydrates  from her diet including Cakes, Sweet Desserts / Pastries, Ice Cream, Soda (diet and regular), Sweet Tea, Candies, Chips, Cookies, Sweet Pastries,  Store Bought Juices, Alcohol in Excess of  1-2 drinks a day, Artificial Sweeteners, Coffee Creamer, and "Sugar-free" Products. This will help patient to have stable blood glucose profile and potentially avoid unintended weight gain.   - I encouraged her to switch to  unprocessed or minimally processed complex starch and increased protein intake (animal or plant source), fruits, and vegetables.  - she is advised to stick to a routine mealtimes to eat 3 meals  a day and avoid unnecessary snacks ( to snack only to correct hypoglycemia).   - I have approached her with the following individualized plan to manage diabetes and patient agrees:   -She is elderly patient with several comorbidities,  #1 priority and treating her diabetes would be to avoid hypoglycemia.    -Given her A1c of  7.1%, she would not need prandial insulin for now.  She is advised to continue Lantus 25 units nightly,  associated with monitoring of blood glucose 2 times a day-daily before breakfast and at bedtime.   -Patient is encouraged to call clinic for blood glucose levels less than 70 or above 300 mg /dl.  -She is not a candidate for metformin, SGLT2 inhibitors. -She will be considered for low-dose glipizide if she loses control during subsequent visits. -- Patient specific target  A1c;  LDL, HDL, Triglycerides, and  Waist Circumference were discussed in detail.  2) BP/HTN:  Her blood pressure is controlled to target.  She is advised to continue current medications including losartan . 3) Lipids/HPL:   Her previsit show LDL of 75.  She will continue to benefit from statin therapy. She is advised to continue  simvastatin 40 mg p.o. nightly.        4)  Weight/Diet: Her BMI is 32.4-may benefit from some weight loss.  CDE Consult will be initiated , exercise, and detailed carbohydrates information provided.  5) vitamin D deficiency-new diagnosis for -She is continued on vitamin D supplement,  cholecalciferol 4000 units daily for 90 days.  6) Chronic Care/Health Maintenance:  -she  is on ACEI/ARB and Statin medications and  is encouraged to initiate and continue to follow up with Ophthalmology, Dentist,  Podiatrist at least yearly or according to recommendations, and advised to  stay away from smoking. I have recommended yearly flu vaccine and pneumonia vaccine at least every 5 years; moderate intensity exercise for up to 150 minutes weekly; and  sleep for at least 7 hours a day.  - I advised patient to maintain close follow up with Rosita Fire, MD for primary care needs.  - Time spent on this patient care encounter:  35 min, of which > 50% was spent in  counseling and the rest reviewing her blood glucose logs , discussing her hypoglycemia and hyperglycemia episodes, reviewing her current and  previous labs / studies  ( including abstraction from other facilities) and medications  doses and developing a  long term treatment plan and documenting her care.   Please  refer to Patient Instructions for Blood Glucose Monitoring and Insulin/Medications Dosing Guide"  in media tab for additional information. Please  also refer to " Patient Self Inventory" in the Media  tab for reviewed elements of pertinent patient history.  Amy Stein participated in the discussions, expressed understanding, and voiced agreement with the above plans.  All questions were answered to her satisfaction. she is encouraged to contact clinic should she have any questions or concerns prior to her return visit.   Follow up plan: - Return in about 3 months (around 12/28/2019) for Bring Meter and Logs- A1c in Office.  Glade Lloyd,  MD [facility] [address] [place], [place] [address] Phone: [phone]  Fax: [phone]    09/27/2019, 2:42 PM  This note was partially dictated with voice recognition software. Similar sounding words can be transcribed inadequately or may not  be corrected upon review.

## 2019-09-28 DIAGNOSIS — G301 Alzheimer's disease with late onset: Secondary | ICD-10-CM | POA: Diagnosis not present

## 2019-09-29 DIAGNOSIS — G301 Alzheimer's disease with late onset: Secondary | ICD-10-CM | POA: Diagnosis not present

## 2019-09-29 DIAGNOSIS — E1142 Type 2 diabetes mellitus with diabetic polyneuropathy: Secondary | ICD-10-CM | POA: Diagnosis not present

## 2019-10-28 DIAGNOSIS — G301 Alzheimer's disease with late onset: Secondary | ICD-10-CM | POA: Diagnosis not present

## 2019-10-31 DIAGNOSIS — N1831 Chronic kidney disease, stage 3a: Secondary | ICD-10-CM | POA: Diagnosis not present

## 2019-10-31 DIAGNOSIS — E1142 Type 2 diabetes mellitus with diabetic polyneuropathy: Secondary | ICD-10-CM | POA: Diagnosis not present

## 2019-10-31 DIAGNOSIS — E785 Hyperlipidemia, unspecified: Secondary | ICD-10-CM | POA: Diagnosis not present

## 2019-11-08 DIAGNOSIS — L84 Corns and callosities: Secondary | ICD-10-CM | POA: Diagnosis not present

## 2019-11-08 DIAGNOSIS — E119 Type 2 diabetes mellitus without complications: Secondary | ICD-10-CM | POA: Diagnosis not present

## 2019-11-08 DIAGNOSIS — B351 Tinea unguium: Secondary | ICD-10-CM | POA: Diagnosis not present

## 2019-11-08 DIAGNOSIS — M76821 Posterior tibial tendinitis, right leg: Secondary | ICD-10-CM | POA: Diagnosis not present

## 2019-11-15 DIAGNOSIS — N189 Chronic kidney disease, unspecified: Secondary | ICD-10-CM | POA: Diagnosis not present

## 2019-11-15 DIAGNOSIS — E559 Vitamin D deficiency, unspecified: Secondary | ICD-10-CM | POA: Diagnosis not present

## 2019-11-15 DIAGNOSIS — I129 Hypertensive chronic kidney disease with stage 1 through stage 4 chronic kidney disease, or unspecified chronic kidney disease: Secondary | ICD-10-CM | POA: Diagnosis not present

## 2019-11-15 DIAGNOSIS — E1122 Type 2 diabetes mellitus with diabetic chronic kidney disease: Secondary | ICD-10-CM | POA: Diagnosis not present

## 2019-11-15 DIAGNOSIS — D472 Monoclonal gammopathy: Secondary | ICD-10-CM | POA: Diagnosis not present

## 2019-11-16 DIAGNOSIS — N189 Chronic kidney disease, unspecified: Secondary | ICD-10-CM | POA: Diagnosis not present

## 2019-11-16 DIAGNOSIS — D472 Monoclonal gammopathy: Secondary | ICD-10-CM | POA: Diagnosis not present

## 2019-11-16 DIAGNOSIS — D638 Anemia in other chronic diseases classified elsewhere: Secondary | ICD-10-CM | POA: Diagnosis not present

## 2019-11-16 DIAGNOSIS — I129 Hypertensive chronic kidney disease with stage 1 through stage 4 chronic kidney disease, or unspecified chronic kidney disease: Secondary | ICD-10-CM | POA: Diagnosis not present

## 2019-11-16 DIAGNOSIS — E1122 Type 2 diabetes mellitus with diabetic chronic kidney disease: Secondary | ICD-10-CM | POA: Diagnosis not present

## 2019-12-01 DIAGNOSIS — E1142 Type 2 diabetes mellitus with diabetic polyneuropathy: Secondary | ICD-10-CM | POA: Diagnosis not present

## 2019-12-01 DIAGNOSIS — I10 Essential (primary) hypertension: Secondary | ICD-10-CM | POA: Diagnosis not present

## 2019-12-24 ENCOUNTER — Other Ambulatory Visit (HOSPITAL_COMMUNITY): Payer: Self-pay | Admitting: Internal Medicine

## 2019-12-24 DIAGNOSIS — Z1231 Encounter for screening mammogram for malignant neoplasm of breast: Secondary | ICD-10-CM

## 2019-12-31 ENCOUNTER — Other Ambulatory Visit: Payer: Self-pay

## 2019-12-31 ENCOUNTER — Encounter: Payer: Self-pay | Admitting: Nurse Practitioner

## 2019-12-31 ENCOUNTER — Ambulatory Visit (INDEPENDENT_AMBULATORY_CARE_PROVIDER_SITE_OTHER): Payer: Medicare Other | Admitting: Nurse Practitioner

## 2019-12-31 VITALS — BP 106/66 | HR 75 | Wt 157.2 lb

## 2019-12-31 DIAGNOSIS — E782 Mixed hyperlipidemia: Secondary | ICD-10-CM

## 2019-12-31 DIAGNOSIS — E559 Vitamin D deficiency, unspecified: Secondary | ICD-10-CM

## 2019-12-31 DIAGNOSIS — I10 Essential (primary) hypertension: Secondary | ICD-10-CM | POA: Diagnosis not present

## 2019-12-31 DIAGNOSIS — Z794 Long term (current) use of insulin: Secondary | ICD-10-CM

## 2019-12-31 DIAGNOSIS — N184 Chronic kidney disease, stage 4 (severe): Secondary | ICD-10-CM

## 2019-12-31 DIAGNOSIS — E1122 Type 2 diabetes mellitus with diabetic chronic kidney disease: Secondary | ICD-10-CM | POA: Diagnosis not present

## 2019-12-31 LAB — POCT GLYCOSYLATED HEMOGLOBIN (HGB A1C): Hemoglobin A1C: 6.9 % — AB (ref 4.0–5.6)

## 2019-12-31 NOTE — Progress Notes (Signed)
Endocrinology follow-up  Note       12/31/2019, 10:12 AM   Subjective:    Patient ID: Amy Stein, female    DOB: 16-Nov-1933.  Amy Stein is being seen in follow-up  for management of currently uncontrolled symptomatic type 2 diabetes, hypertension, hyperlipidemia. PMD:   Rosita Fire, MD.   Past Medical History:  Diagnosis Date  . Diabetes mellitus   . Hypertension   . Shingles    on both legs 1/18   Past Surgical History:  Procedure Laterality Date  . COLONOSCOPY    . COLONOSCOPY N/A 02/23/2017   Procedure: COLONOSCOPY;  Surgeon: Daneil Dolin, MD;  Location: [facility];  Service: Endoscopy;  Laterality: N/A;  9:30 Am  . POLYPECTOMY  02/23/2017   Procedure: POLYPECTOMY;  Surgeon: Daneil Dolin, MD;  Location: [facility];  Service: Endoscopy;;  cecum   Social History   Socioeconomic History  . Marital status: Single    Spouse name: Not on file  . Number of children: Not on file  . Years of education: Not on file  . Highest education level: Not on file  Occupational History  . Not on file  Tobacco Use  . Smoking status: Former Research scientist (life sciences)  . Smokeless tobacco: Never Used  Vaping Use  . Vaping Use: Never used  Substance and Sexual Activity  . Alcohol use: No  . Drug use: No  . Sexual activity: Yes    Birth control/protection: Post-menopausal  Other Topics Concern  . Not on file  Social History Narrative  . Not on file   Social Determinants of Health   Financial Resource Strain:   . Difficulty of Paying Living Expenses: Not on file  Food Insecurity:   . Worried About Charity fundraiser in the Last Year: Not on file  . Ran Out of Food in the Last Year: Not on file  Transportation Needs:   . Lack of Transportation (Medical): Not on file  . Lack of Transportation (Non-Medical): Not on file  Physical Activity:   . Days of Exercise per Week: Not on file  . Minutes of  Exercise per Session: Not on file  Stress:   . Feeling of Stress : Not on file  Social Connections:   . Frequency of Communication with Friends and Family: Not on file  . Frequency of Social Gatherings with Friends and Family: Not on file  . Attends Religious Services: Not on file  . Active Member of Clubs or Organizations: Not on file  . Attends Archivist Meetings: Not on file  . Marital Status: Not on file   Outpatient Encounter Medications as of 12/31/2019  Medication Sig  . ACCU-CHEK GUIDE test strip USE TO TEST THREE TIMESCDAILY.  Marland Kitchen amLODipine (NORVASC) 5 MG tablet Take 5 mg by mouth daily.  Marland Kitchen aspirin EC 81 MG tablet Take 81 mg by mouth daily.  . ferrous sulfate 325 (65 FE) MG tablet Take 1 tablet (325 mg total) by mouth 2 (two) times daily with a meal.  . gabapentin (NEURONTIN) 400 MG capsule Take 400 mg by mouth 3 (three) times  daily.   . HM VITAMIN D3 100 MCG (4000 UT) CAPS TAKE ONE CAPSULE (5000 UNITS TOTAL) BY MOUTH DAILY  . hydrochlorothiazide (MICROZIDE) 12.5 MG capsule Take 12.5 mg by mouth daily.  . Insulin Glargine (LANTUS SOLOSTAR) 100 UNIT/ML Solostar Pen Inject 35 Units into the skin at bedtime.  . Insulin Pen Needle (PEN NEEDLES) 32G X 4 MM MISC 1 each by Does not apply route at bedtime.  Marland Kitchen labetalol (NORMODYNE) 100 MG tablet Take 100 mg by mouth 2 (two) times daily.   Marland Kitchen losartan (COZAAR) 100 MG tablet Take 100 mg by mouth daily.  . medroxyPROGESTERone (PROVERA) 10 MG tablet Take 1 tablet (10 mg total) by mouth See admin instructions. Take 10 mg by mouth daily for the first 10 days of the month  . naproxen (NAPROSYN) 500 MG tablet Take 1 tablet (500 mg total) by mouth 2 (two) times daily with a meal.  . omeprazole (PRILOSEC) 20 MG capsule Take 20 mg by mouth daily.  Marland Kitchen oxybutynin (DITROPAN-XL) 10 MG 24 hr tablet Take 10 mg by mouth daily.  . simvastatin (ZOCOR) 40 MG tablet Take 40 mg by mouth at bedtime.   . TRADJENTA 5 MG TABS tablet Take 5 mg by mouth  daily.  . vitamin B-12 1000 MCG tablet Take 1 tablet (1,000 mcg total) by mouth daily.  . [DISCONTINUED] oxybutynin (DITROPAN) 5 MG tablet Take 5 mg by mouth daily.   No facility-administered encounter medications on file as of 12/31/2019.    ALLERGIES: Allergies  Allergen Reactions  . Sulfa Antibiotics   . Sulfonamide Derivatives Rash    VACCINATION STATUS:  There is no immunization history on file for this patient.  Diabetes She presents for her follow-up diabetic visit. She has type 2 diabetes mellitus. Onset time: She was diagnosed at approximate age of 71 years. Her disease course has been improving. There are no hypoglycemic associated symptoms. Pertinent negatives for hypoglycemia include no confusion, headaches, pallor or seizures. There are no diabetic associated symptoms. Pertinent negatives for diabetes include no chest pain, no polydipsia, no polyphagia and no polyuria. There are no hypoglycemic complications. Symptoms are improving. Diabetic complications include nephropathy. Risk factors for coronary artery disease include diabetes mellitus, hypertension, sedentary lifestyle, tobacco exposure, dyslipidemia and post-menopausal. Current diabetic treatment includes insulin injections. She is compliant with treatment all of the time. Her weight is decreasing steadily. She is following a generally unhealthy diet. When asked about meal planning, she reported none. She has not had a previous visit with a dietitian. She rarely participates in exercise. Her home blood glucose trend is fluctuating minimally. Her breakfast blood glucose range is generally 130-140 mg/dl. Her overall blood glucose range is 130-140 mg/dl. (She presents today with her meter and logs showing at target fasting and postprandial glycemic profile.  She denies any episodes of hypoglycemia.  Her POCT A1C today is 6.9%, improving from last visit of 7.1%. ) An ACE inhibitor/angiotensin II receptor blocker is being taken.  She does not see a podiatrist.Eye exam is current.  Hypertension This is a chronic problem. The current episode started more than 1 year ago. The problem has been gradually improving since onset. The problem is controlled. Pertinent negatives include no chest pain, headaches, palpitations or shortness of breath. There are no associated agents to hypertension. Risk factors for coronary artery disease include diabetes mellitus, sedentary lifestyle, smoking/tobacco exposure, post-menopausal state and dyslipidemia. Past treatments include angiotensin blockers and diuretics. The current treatment provides moderate improvement. Compliance problems include exercise.  Hypertensive end-organ damage includes kidney disease. Identifiable causes of hypertension include chronic renal disease.   Review of systems  Constitutional: + Minimally fluctuating body weight,  current Body mass index is 30.7 kg/m. , no fatigue, no subjective hyperthermia, no subjective hypothermia Eyes: no blurry vision, no xerophthalmia ENT: no sore throat, no nodules palpated in throat, no dysphagia/odynophagia, no hoarseness Cardiovascular: no chest pain, no shortness of breath, no palpitations, no leg swelling Respiratory: no cough, no shortness of breath Gastrointestinal: no nausea/vomiting/diarrhea Musculoskeletal: no muscle/joint aches Skin: no rashes, no hyperemia Neurological: no tremors, no numbness, no tingling, no dizziness Psychiatric: no depression, no anxiety  Objective:    BP 106/66 (BP Location: Right Arm, Patient Position: Sitting)   Pulse 75   Wt 157 lb 3.2 oz (71.3 kg)   BMI 30.70 kg/m   Wt Readings from Last 3 Encounters:  12/31/19 157 lb 3.2 oz (71.3 kg)  09/27/19 166 lb 3.2 oz (75.4 kg)  05/30/19 164 lb 9.6 oz (74.7 kg)    BP Readings from Last 3 Encounters:  12/31/19 106/66  09/27/19 (!) 103/58  05/30/19 109/70    Physical Exam- Limited  Constitutional:  Body mass index is 30.7 kg/m. , not in  acute distress, normal state of mind Eyes:  EOMI, no exophthalmos Neck: Supple Thyroid: No gross goiter Cardiovascular: RRR, no murmers, rubs, or gallops, no edema Respiratory: Adequate breathing efforts, no crackles, rales, rhonchi, or wheezing Musculoskeletal: no gross deformities, strength intact in all four extremities, no gross restriction of joint movements Skin:  no rashes, no hyperemia Neurological: no tremor with outstretched hands   CMP     Component Value Date/Time   NA 141 09/18/2019 0848   K 3.6 09/18/2019 0848   CL 102 09/18/2019 0848   CO2 27 09/18/2019 0848   GLUCOSE 147 (H) 09/18/2019 0848   BUN 47 (H) 09/18/2019 0848   CREATININE 2.00 (H) 09/18/2019 0848   CALCIUM 10.1 09/18/2019 0848   PROT 7.1 09/18/2019 0848   ALBUMIN 3.4 (L) 05/29/2017 2044   AST 26 09/18/2019 0848   ALT 16 09/18/2019 0848   ALKPHOS 75 05/29/2017 2044   BILITOT 0.4 09/18/2019 0848   GFRNONAA 23 (L) 09/18/2019 0848   GFRAA 26 (L) 09/18/2019 0848    Diabetic Labs (most recent): Lab Results  Component Value Date   HGBA1C 6.9 (A) 12/31/2019   HGBA1C 7.1 (A) 09/27/2019   HGBA1C 7.5 (A) 05/30/2019      Lab Results  Component Value Date   TSH 1.68 09/18/2019   TSH 1.29 01/31/2018   TSH 0.793 05/26/2017   FREET4 0.9 09/18/2019   FREET4 0.9 01/31/2018   Lipid Panel     Component Value Date/Time   CHOL 129 09/18/2019 0848   TRIG 143 09/18/2019 0848   HDL 39 (L) 09/18/2019 0848   CHOLHDL 3.3 09/18/2019 0848   LDLCALC 67 09/18/2019 0848      Assessment & Plan:   1. Type 2 diabetes mellitus with stage 3 chronic kidney disease, with long-term current use of insulin ([place])  - Amy Stein has currently uncontrolled symptomatic type 2 DM since 67 years of ago.    She presents today with her meter and logs showing at target fasting and postprandial glycemic profile.  She denies any episodes of hypoglycemia.  Her POCT A1C today is 6.9%, improving from last visit of 7.1%.  -  Recent labs reviewed.  -her diabetes is complicated by stage 4 renal insufficiency and Amy Stein remains  at a high risk for more acute and chronic complications which include CAD, CVA, CKD, retinopathy, and neuropathy. These are all discussed in detail with the patient.  - I have counseled her on diet management by adopting a carbohydrate restricted/protein rich diet.  - The patient admits there is a room for improvement in their diet and drink choices. -  Suggestion is made for the patient to avoid simple carbohydrates from their diet including Cakes, Sweet Desserts / Pastries, Ice Cream, Soda (diet and regular), Sweet Tea, Candies, Chips, Cookies, Sweet Pastries,  Store Bought Juices, Alcohol in Excess of  1-2 drinks a day, Artificial Sweeteners, Coffee Creamer, and "Sugar-free" Products. This will help patient to have stable blood glucose profile and potentially avoid unintended weight gain.   - I encouraged the patient to switch to  unprocessed or minimally processed complex starch and increased protein intake (animal or plant source), fruits, and vegetables.   - Patient is advised to stick to a routine mealtimes to eat 3 meals  a day and avoid unnecessary snacks ( to snack only to correct hypoglycemia).  - I have approached her with the following individualized plan to manage diabetes and patient agrees:   -She is elderly patient with several comorbidities,  #1 priority and treating her diabetes would be to avoid hypoglycemia.    -Given her A1c of  6.9%, she would not need prandial insulin for now.   -She is advised to continue Lantus 35 units SQ daily at bedtime.    -She is encouraged to continue monitoring blood glucose at least twice daily, before breakfast and before bed and to call the clinic if blood glucose is below 70 or greater than 200 for 3 tests in a row.  -She is not a candidate for metformin, SGLT2 inhibitors.  -She will be considered for low-dose glipizide if she loses  control during subsequent visits. -- Patient specific target  A1c;  LDL, HDL, Triglycerides, and  Waist Circumference were discussed in detail.  2) BP/HTN:  Her blood pressure is controlled to target.  She is advised to continue Amlodipine 5 mg po daily, and Losartan 100 mg po daily.  3) Lipids/HPL:    Her previsit lipid panel on 09/18/19 shows controlled LDL of 67 and triglycerides of 143.  She is advised to continue Simvastatin 40 mg po daily at bedtime.  Side effects and precautions discussed with her.  4)  Weight/Diet:  Her Body mass index is 30.7 kg/m.-may benefit from minimal weight loss.  CDE Consult will be initiated , exercise, and detailed carbohydrates information provided.  5) Vitamin D deficiency- -Her most recent vitamin D level was 73 on 09/18/19.  She is advised to continue daily maintenance dose of vitamin D 3 4000 units daily.  6) Chronic Care/Health Maintenance: -she  is on ACEI/ARB and Statin medications and  is encouraged to initiate and continue to follow up with Ophthalmology, Dentist,  Podiatrist at least yearly or according to recommendations, and advised to  stay away from smoking. I have recommended yearly flu vaccine and pneumonia vaccine at least every 5 years; moderate intensity exercise for up to 150 minutes weekly; and  sleep for at least 7 hours a day.  - I advised patient to maintain close follow up with Rosita Fire, MD for primary care needs.  - Time spent on this patient care encounter:  35 min, of which > 50% was spent in  counseling and the rest reviewing her blood glucose logs , discussing her  hypoglycemia and hyperglycemia episodes, reviewing her current and  previous labs / studies  ( including abstraction from other facilities) and medications  doses and developing a  long term treatment plan and documenting her care.   Please refer to Patient Instructions for Blood Glucose Monitoring and Insulin/Medications Dosing Guide"  in media tab for additional  information. Please  also refer to " Patient Self Inventory" in the Media  tab for reviewed elements of pertinent patient history.  Amy Stein participated in the discussions, expressed understanding, and voiced agreement with the above plans.  All questions were answered to her satisfaction. she is encouraged to contact clinic should she have any questions or concerns prior to her return visit.   Follow up plan: - Return in about 4 months (around 05/01/2020) for Diabetes follow up, Previsit labs, Virtual visit ok.  Rayetta Pigg, [facility] [address] [place], [place] [address] Phone: [phone] Fax: [phone]  12/31/2019, 10:12 AM  This note was partially dictated with voice recognition software. Similar sounding words can be transcribed inadequately or may not  be corrected upon review.

## 2019-12-31 NOTE — Patient Instructions (Signed)

## 2020-01-01 DIAGNOSIS — N183 Chronic kidney disease, stage 3 unspecified: Secondary | ICD-10-CM | POA: Diagnosis not present

## 2020-01-01 DIAGNOSIS — E1142 Type 2 diabetes mellitus with diabetic polyneuropathy: Secondary | ICD-10-CM | POA: Diagnosis not present

## 2020-01-03 ENCOUNTER — Encounter: Payer: Self-pay | Admitting: Orthopedic Surgery

## 2020-01-03 ENCOUNTER — Ambulatory Visit (HOSPITAL_COMMUNITY): Payer: Medicaid Other

## 2020-01-03 ENCOUNTER — Other Ambulatory Visit: Payer: Self-pay

## 2020-01-03 ENCOUNTER — Ambulatory Visit: Payer: Medicaid Other | Admitting: Orthopedic Surgery

## 2020-01-03 ENCOUNTER — Ambulatory Visit: Payer: Medicaid Other

## 2020-01-03 ENCOUNTER — Ambulatory Visit (INDEPENDENT_AMBULATORY_CARE_PROVIDER_SITE_OTHER): Payer: Medicare Other | Admitting: Orthopedic Surgery

## 2020-01-03 VITALS — Ht 62.0 in | Wt 155.0 lb

## 2020-01-03 DIAGNOSIS — M25512 Pain in left shoulder: Secondary | ICD-10-CM

## 2020-01-03 DIAGNOSIS — G8929 Other chronic pain: Secondary | ICD-10-CM | POA: Diagnosis not present

## 2020-01-03 DIAGNOSIS — N3000 Acute cystitis without hematuria: Secondary | ICD-10-CM | POA: Insufficient documentation

## 2020-01-03 NOTE — Progress Notes (Addendum)
Chief Complaint  Patient presents with  . Shoulder Pain    left painful with abduction for a year     Follow-up appointment chronic pain left shoulder seen last year treated with naproxen and subacromial injection for osteoarthritis presents back with lateral deltoid pain decreased range of motion  Past Medical History:  Diagnosis Date  . Diabetes mellitus   . Hypertension   . Shingles    on both legs 1/18    Exam decreased external rotation abduction and flexion but reasonable cuff strength tenderness along the anterior joint line  X-rays show glenohumeral arthritis see report  Discussion with the patient regarding treatment options recommend continue naproxen repeat injection follow-up as needed discussed with her probable future need for shoulder replacement  Procedure note the subacromial injection shoulder left   Verbal consent was obtained to inject the  Left   Shoulder  Timeout was completed to confirm the injection site is a subacromial space of the  left  shoulder  Medication used Depo-Medrol 40 mg and lidocaine 1% 3 cc  Anesthesia was provided by ethyl chloride  The injection was performed in the left  posterior subacromial space. After pinning the skin with alcohol and anesthetized the skin with ethyl chloride the subacromial space was injected using a 20-gauge needle. There were no complications  Sterile dressing was applied.

## 2020-01-03 NOTE — Patient Instructions (Signed)
You have arthritis of the shoulder   You have received an injection of steroids into the joint. 15% of patients will have increased pain within the 24 hours postinjection.   This is transient and will go away.   We recommend that you use ice packs on the injection site for 20 minutes every 2 hours and extra strength Tylenol 2 tablets every 8 as needed until the pain resolves.  If you continue to have pain after taking the Tylenol and using the ice please call the office for further instructions.

## 2020-01-17 ENCOUNTER — Other Ambulatory Visit: Payer: Self-pay

## 2020-01-17 ENCOUNTER — Ambulatory Visit (HOSPITAL_COMMUNITY)
Admission: RE | Admit: 2020-01-17 | Discharge: 2020-01-17 | Disposition: A | Discharge status: Home / Self Care | Payer: Medicare Other | Source: Ambulatory Visit | Attending: Internal Medicine | Admitting: Internal Medicine

## 2020-01-17 DIAGNOSIS — Z1231 Encounter for screening mammogram for malignant neoplasm of breast: Secondary | ICD-10-CM | POA: Insufficient documentation

## 2020-01-17 DIAGNOSIS — Z7689 Persons encountering health services in other specified circumstances: Secondary | ICD-10-CM | POA: Diagnosis not present

## 2020-01-22 ENCOUNTER — Other Ambulatory Visit (HOSPITAL_COMMUNITY): Payer: Self-pay | Admitting: Internal Medicine

## 2020-01-22 DIAGNOSIS — R928 Other abnormal and inconclusive findings on diagnostic imaging of breast: Secondary | ICD-10-CM

## 2020-01-24 ENCOUNTER — Ambulatory Visit (INDEPENDENT_AMBULATORY_CARE_PROVIDER_SITE_OTHER): Payer: Medicare Other | Admitting: Orthopedic Surgery

## 2020-01-24 ENCOUNTER — Encounter: Payer: Self-pay | Admitting: Orthopedic Surgery

## 2020-01-24 ENCOUNTER — Other Ambulatory Visit: Payer: Self-pay

## 2020-01-24 VITALS — BP 121/74 | HR 75 | Ht 62.0 in | Wt 158.0 lb

## 2020-01-24 DIAGNOSIS — M19012 Primary osteoarthritis, left shoulder: Secondary | ICD-10-CM | POA: Diagnosis not present

## 2020-01-24 DIAGNOSIS — G8929 Other chronic pain: Secondary | ICD-10-CM

## 2020-01-24 DIAGNOSIS — M25512 Pain in left shoulder: Secondary | ICD-10-CM

## 2020-01-24 DIAGNOSIS — M19019 Primary osteoarthritis, unspecified shoulder: Secondary | ICD-10-CM

## 2020-01-24 NOTE — Progress Notes (Signed)
Chief Complaint  Patient presents with  . Shoulder Pain    left     86 yo female with OA left shoulder   H/o HTN and DM  C/o pain left shoulder decreased rom and trouble getting dressed   She takes tylenol , tried naproxen and a subacromial injection    Physical Exam   BP 121/74   Pulse 75   Ht 5\' 2"  (1.575 m)   Wt 158 lb (71.7 kg)   BMI 28.90 kg/m   Left shoulder   ER 40   arom flex: 80, prom 110  Neg drop test   Xray DJD LT SHOULDER   Referral to Dr Amedeo Kinsman

## 2020-01-24 NOTE — Patient Instructions (Addendum)
See Dr Amedeo Kinsman for left Total Shoulder   Shoulder Joint Replacement Shoulder joint replacement is surgery to replace damaged parts of the shoulder joint with artificial parts (prostheses). Two parts may be used to replace this joint:  The humeral component replaces the head of the upper arm bone (humerus). This is a rounded ball that is attached to a stem that fits into the humerus.  The glenoid component replaces the socket (glenoid depression). The prostheses are usually made of metal and plastic. Depending on the damage to your shoulder, the surgeon may replace just the humeral head (hemiarthroplasty) or replace both the humeral head and the glenoid (total shoulder replacement). The surrounding muscles and tendons hold the prosthetic parts in place. This procedure may be done to relieve joint pain or to treat severe shoulder fractures or arthritis. This surgery may be done if other non-surgical treatments have not worked. Tell a health care provider about:  Any allergies you have.  All medicines you are taking, including vitamins, herbs, eye drops, creams, and over-the-counter medicines.  Any problems you or family members have had with anesthetic medicines.  Any blood disorders you have.  Any surgeries you have had.  Any medical conditions you have.  Whether you are pregnant or may be pregnant. What are the risks? Generally, this is a safe procedure. However, problems may occur, including:  Infection.  Bleeding.  Allergic reactions to medicines.  Damage to other structures, organs, or nerves.  Fracture of the upper arm bone during or after surgery.  Instability of the shoulder after surgery.  Loosening of the glenoid component over time.  Unusual bone growth.  Failure of bone healing after surgery. What happens before the procedure? Medicines  Ask your health care provider about: ? Changing or stopping your regular medicines. This is especially important if you  are taking diabetes medicines or blood thinners. ? Taking medicines such as aspirin and ibuprofen. These medicines can thin your blood. Do not take these medicines before your procedure if your health care provider instructs you not to.  You may be given antibiotic medicine to help prevent infection. Staying hydrated Follow instructions from your health care provider about hydration, which may include:  Up to 2 hours before the procedure - you may continue to drink clear liquids, such as water, clear fruit juice, black coffee, and plain tea. Eating and drinking restrictions Follow instructions from your health care provider about eating and drinking, which may include:  8 hours before the procedure - stop eating heavy meals or foods such as meat, fried foods, or fatty foods.  6 hours before the procedure - stop eating light meals or foods, such as toast or cereal.  6 hours before the procedure - stop drinking milk or drinks that contain milk.  2 hours before the procedure - stop drinking clear liquids. General instructions  Plan to have someone take you home from the hospital or clinic.  Plan to have someone with you for 24 hours after the procedure. It is also recommended that you have someone to assist you at home for the first few weeks after the procedure.  Do not use any products that contain nicotine or tobacco, such as cigarettes and e-cigarettes. If you need help quitting, ask your healthcare provider.  Ask your health care provider how your surgical site will be marked or identified. What happens during the procedure?  To reduce your risk of infection: ? Your health care team will wash or sanitize their hands. ?  Your skin will be washed with soap. ? Hair may be removed from the surgical area.  An IV tube will be inserted into one of your veins.  You will be given one or more of the following: ? A medicine to help you relax (sedative). ? A medicine to make you fall  asleep (general anesthetic). ? A medicine that is injected into your shoulder area to numb everything around the injection site (regional anesthetic).  An incision will be made on the front of the shoulder from the collarbone (clavicle) to the point where the shoulder muscle (deltoid) attaches to the upper arm bone.  The upper arm bone will be removed from the socket to expose the ball-like end of the upper arm.  The center cavity of the humerus bone will be cleaned and enlarged to create a hollow area that matches the shape of the implant stem. The top end of the bone will be smoothed so the stem will be level with the bone surface when it is inserted.  If the ball of the prosthesis is a separate piece, the proper size will be selected and attached.  If the socket portion of the joint is healthy and the surrounding muscles are in good condition, the surgeon may decide not to replace it. However, if the socket needs to be replaced: ? The surgeon will prepare the socket surface by removing the remaining damaged cartilage. ? The socket bone will be gently reshaped to fit the implant. ? The glenoid component will be implanted and cemented into position.  The arm bone, with its new artificial head, will be replaced in the socket. The surgeon will reattach the supporting tendons and close the incision with sutures or stitches.  A bandage (dressing) will be placed over your incision.  Your arm will be placed in a sling or immobilizer, and a support pillow will be placed under your elbow.  Tubes will be placed to remove excess drainage. These are usually removed after a couple of days. The procedure may vary among health care providers and hospitals. What happens after the procedure?  Your blood pressure, heart rate, breathing rate, and blood oxygen level will be monitored until the medicines you were given have worn off.  Your arm will be numb if you were given a regional anesthetic. This may  last until the next day.  You will be given pain medicine as needed.  Your arm and shoulder will be stiff and bruised. This will improve over time.  An icing device will be placed around your shoulder. This helps to control pain and swelling.  Your arm will be in a sling or immobilizer. You will need to wear this for 2-4 weeks after surgery or as told by your health care provider.  Your health care team may begin to show you exercises for your shoulder.  Do not use your arm to push yourself up in bed or from a chair.  Do not lift anything that is heavier than a cup of coffee.  Do not drive for 24 hours if you were given a sedative. Ask your health care provider when it is safe for you to drive. Summary  Shoulder joint replacement is surgery to replace damaged parts of the shoulder joint with artificial parts (prostheses).  The surgeon may replace just the humeral head (hemiarthroplasty) or replace both the humeral head and the glenoid (total shoulder replacement), based on the damage to your shoulder.  Taking medicine, icing the painful area, and  doing exercises as told by your health care provider will help control your shoulder pain, swelling, and stiffness after surgery.  After the procedure, do not lift anything that is heavier than a cup of coffee and do not use your arm to push yourself up in bed or from a chair. This information is not intended to replace advice given to you by your health care provider. Make sure you discuss any questions you have with your health care provider. Document Revised: 03/11/2017 Document Reviewed: 01/12/2016 Elsevier Patient Education  2020 [employer].

## 2020-01-28 ENCOUNTER — Other Ambulatory Visit (HOSPITAL_COMMUNITY): Payer: Self-pay | Admitting: Gerontology

## 2020-01-28 DIAGNOSIS — R928 Other abnormal and inconclusive findings on diagnostic imaging of breast: Secondary | ICD-10-CM

## 2020-01-31 DIAGNOSIS — I10 Essential (primary) hypertension: Secondary | ICD-10-CM | POA: Diagnosis not present

## 2020-01-31 DIAGNOSIS — E1142 Type 2 diabetes mellitus with diabetic polyneuropathy: Secondary | ICD-10-CM | POA: Diagnosis not present

## 2020-02-01 DIAGNOSIS — E1142 Type 2 diabetes mellitus with diabetic polyneuropathy: Secondary | ICD-10-CM | POA: Diagnosis not present

## 2020-02-01 DIAGNOSIS — G301 Alzheimer's disease with late onset: Secondary | ICD-10-CM | POA: Diagnosis not present

## 2020-02-01 DIAGNOSIS — I10 Essential (primary) hypertension: Secondary | ICD-10-CM | POA: Diagnosis not present

## 2020-02-01 DIAGNOSIS — Z23 Encounter for immunization: Secondary | ICD-10-CM | POA: Diagnosis not present

## 2020-02-01 DIAGNOSIS — R040 Epistaxis: Secondary | ICD-10-CM | POA: Diagnosis not present

## 2020-02-12 ENCOUNTER — Ambulatory Visit (INDEPENDENT_AMBULATORY_CARE_PROVIDER_SITE_OTHER): Payer: Medicare Other | Admitting: Orthopedic Surgery

## 2020-02-12 ENCOUNTER — Encounter: Payer: Self-pay | Admitting: Orthopedic Surgery

## 2020-02-12 ENCOUNTER — Other Ambulatory Visit: Payer: Self-pay

## 2020-02-12 VITALS — BP 114/67 | HR 89 | Ht 62.0 in | Wt 158.0 lb

## 2020-02-12 DIAGNOSIS — M19012 Primary osteoarthritis, left shoulder: Secondary | ICD-10-CM | POA: Diagnosis not present

## 2020-02-12 NOTE — Progress Notes (Signed)
New Patient Visit  Assessment: Amy Stein is a 86 y.o. female with the following: Left shoulder glenohumeral arthritis  Plan: I had an extensive discussion with Amy Stein in clinic with the assistance of her personal aide regarding her left shoulder.  I outlined 3 potential options for treatment given her severe glenohumeral arthritis.  She can continue with her current regimen of Tylenol and ice, with activities as tolerated.  We can also try a steroid injection in clinic today.  I also discussed proceeding with left total shoulder versus reverse shoulder arthroplasty, and discussed this briefly.  After answering all of her questions, I recommended a steroid injection, which was completed in clinic today.  We can continue with steroid injections if this is effective for her pain.  If she is interested in total shoulder arthroplasty in the future, I have asked that she come with family, so that we can discuss this in great detail.  There will be much to consider in regards to her undergoing surgery, as well as the recovery period.  If she is interested in proceeding with surgery, I would have her see her primary care physician to obtain medical and surgical clearance.  We would also need to obtain a CT scan prior to surgery.  No follow-up is scheduled at this time, if they have any issues in the future, have asked them to contact the clinic to schedule another appointment.   Procedure note injection Left shoulder    Verbal consent was obtained to inject the left shoulder, subacromial space Timeout was completed to confirm the site of injection.  The skin was prepped with alcohol and ethyl chloride was sprayed at the injection site.  A 21-gauge needle was used to inject 40 mg of Depo-Medrol and 1% lidocaine (3 cc) into the subacromial space of the left shoulder using a posterolateral approach.  There were no complications. A sterile bandage was applied.   Follow-up: Return if symptoms worsen or  fail to improve.  Subjective:  Chief Complaint  Patient presents with  . Shoulder Pain    left /     History of Present Illness: Amy Stein is a 86 y.o. RHD female who presents for evaluation of her left shoulder.  Briefly, she has been seen by my partner, Dr. Aline Brochure, in the past and continues to have debilitating pain in her left shoulder.  This has been progressively worsening over several years.  She denies a specific injury.  She has had multiple injections with Dr. Aline Brochure in the past, which did provide some relief.  Otherwise, she is taking Tylenol and icing her shoulder as needed.  No recent physical therapy.  She lives by herself, but has an in-home aide stay with her on a regular basis, and has some family members nearby.  Review of Systems: No fevers or chills No chest pain No shortness of breath No numbness or tingling.  Medical History:  Past Medical History:  Diagnosis Date  . Diabetes mellitus   . Hypertension   . Shingles    on both legs 1/18    Past Surgical History:  Procedure Laterality Date  . COLONOSCOPY    . COLONOSCOPY N/A 02/23/2017   Procedure: COLONOSCOPY;  Surgeon: Daneil Dolin, MD;  Location: [facility];  Service: Endoscopy;  Laterality: N/A;  9:30 Am  . POLYPECTOMY  02/23/2017   Procedure: POLYPECTOMY;  Surgeon: Daneil Dolin, MD;  Location: [facility];  Service: Endoscopy;;  cecum  Family History  Problem Relation Age of Onset  . Throat cancer Father   . Pneumonia Mother   . Pancreatic cancer Brother    Social History   Tobacco Use  . Smoking status: Former Research scientist (life sciences)  . Smokeless tobacco: Never Used  Vaping Use  . Vaping Use: Never used  Substance Use Topics  . Alcohol use: No  . Drug use: No    Allergies  Allergen Reactions  . Sulfa Antibiotics   . Sulfonamide Derivatives Rash    Current Meds  Medication Sig  . ACCU-CHEK GUIDE test strip USE TO TEST THREE TIMESCDAILY.  Marland Kitchen amLODipine (NORVASC) 5 MG tablet Take  5 mg by mouth daily.  Marland Kitchen aspirin EC 81 MG tablet Take 81 mg by mouth daily.  . ferrous sulfate 325 (65 FE) MG tablet Take 1 tablet (325 mg total) by mouth 2 (two) times daily with a meal.  . gabapentin (NEURONTIN) 400 MG capsule Take 400 mg by mouth 3 (three) times daily.   Marland Kitchen HM VITAMIN D3 100 MCG (4000 UT) CAPS TAKE ONE CAPSULE (5000 UNITS TOTAL) BY MOUTH DAILY  . hydrochlorothiazide (MICROZIDE) 12.5 MG capsule Take 12.5 mg by mouth daily.  . Insulin Glargine (LANTUS SOLOSTAR) 100 UNIT/ML Solostar Pen Inject 35 Units into the skin at bedtime.  . Insulin Pen Needle (PEN NEEDLES) 32G X 4 MM MISC 1 each by Does not apply route at bedtime.  Marland Kitchen labetalol (NORMODYNE) 100 MG tablet Take 100 mg by mouth 2 (two) times daily.   Marland Kitchen losartan (COZAAR) 100 MG tablet Take 100 mg by mouth daily.  Marland Kitchen omeprazole (PRILOSEC) 20 MG capsule Take 20 mg by mouth daily.   Marland Kitchen oxybutynin (DITROPAN-XL) 10 MG 24 hr tablet Take 10 mg by mouth daily.  . simvastatin (ZOCOR) 40 MG tablet Take 40 mg by mouth at bedtime.   . TRADJENTA 5 MG TABS tablet Take 5 mg by mouth daily.  . vitamin B-12 1000 MCG tablet Take 1 tablet (1,000 mcg total) by mouth daily.    Objective: BP 114/67   Pulse 89   Ht 5\' 2"  (1.575 m)   Wt 158 lb (71.7 kg)   BMI 28.90 kg/m   Physical Exam:  General: Elderly female, alert and oriented.  No acute distress.  Evaluation left shoulder demonstrates no obvious deformity.  Mild atrophy is appreciated within the posterior aspect of her shoulder.  Severely limited range of motion, including 110 degrees of forward flexion, 30 degrees of external rotation at her side.  Internal rotation to her lumbar spine.  5/5 strength in the supraspinatus, as well as the infraspinatus.  Negative belly press.  Crepitus is appreciated with range of motion.    IMAGING: I personally ordered and reviewed the following images:  X-rays were obtained at a previous clinic appointment and demonstrates severe glenohumeral  arthritis.  Inferior osteophyte on the humeral head.  No proximal humeral migration.  New Medications:  No orders of the defined types were placed in this encounter.     Mordecai Rasmussen, MD  02/12/2020 2:42 PM

## 2020-02-12 NOTE — Patient Instructions (Signed)
We discussed total shoulder replacement in clinic today.  If this is something you would like to pursue, or discuss in more detail, I would recommend a family member come to the next appointment so that we can discuss it in more detail.    Instructions Following Joint Injections  In clinic today, you received an injection in one of your joints (sometimes more than one).  Occasionally, you can have some pain at the injection site, this is normal.  You can place ice at the injection site, or take over-the-counter medications such as Tylenol (acetaminophen) or Advil (ibuprofen).  Please follow all directions listed on the bottle.  If your joint (knee or shoulder) becomes swollen, red or very painful, please contact the clinic for additional assistance.   Two medications were injected, including lidocaine and a steroid (often referred to as cortisone).  Lidocaine is effective almost immediately but wears off quickly.  However, the steroid can take a few days to improve your symptoms.  In some cases, it can make your pain worse for a couple of days.  Do not be concerned if this happens as it is common.  You can apply ice or take some over-the-counter medications as needed.

## 2020-02-15 ENCOUNTER — Ambulatory Visit (HOSPITAL_COMMUNITY)
Admission: RE | Admit: 2020-02-15 | Discharge: 2020-02-15 | Disposition: A | Discharge status: Home / Self Care | Payer: Medicare Other | Source: Ambulatory Visit | Attending: Gerontology | Admitting: Gerontology

## 2020-02-15 ENCOUNTER — Other Ambulatory Visit: Payer: Self-pay

## 2020-02-15 ENCOUNTER — Other Ambulatory Visit (HOSPITAL_COMMUNITY): Payer: Self-pay | Admitting: Gerontology

## 2020-02-15 ENCOUNTER — Ambulatory Visit: Payer: Medicare Other | Admitting: Orthopedic Surgery

## 2020-02-15 DIAGNOSIS — R928 Other abnormal and inconclusive findings on diagnostic imaging of breast: Secondary | ICD-10-CM

## 2020-02-19 ENCOUNTER — Other Ambulatory Visit: Payer: Self-pay

## 2020-02-19 ENCOUNTER — Other Ambulatory Visit (HOSPITAL_COMMUNITY): Payer: Self-pay | Admitting: Gerontology

## 2020-02-19 ENCOUNTER — Ambulatory Visit (HOSPITAL_COMMUNITY)
Admission: RE | Admit: 2020-02-19 | Discharge: 2020-02-19 | Disposition: A | Discharge status: Home / Self Care | Payer: Medicare Other | Source: Ambulatory Visit | Attending: Gerontology | Admitting: Gerontology

## 2020-02-19 ENCOUNTER — Encounter (HOSPITAL_COMMUNITY): Payer: Self-pay

## 2020-02-19 DIAGNOSIS — R928 Other abnormal and inconclusive findings on diagnostic imaging of breast: Secondary | ICD-10-CM | POA: Insufficient documentation

## 2020-02-19 DIAGNOSIS — N6011 Diffuse cystic mastopathy of right breast: Secondary | ICD-10-CM | POA: Diagnosis not present

## 2020-02-19 DIAGNOSIS — N6314 Unspecified lump in the right breast, lower inner quadrant: Secondary | ICD-10-CM | POA: Diagnosis not present

## 2020-02-19 DIAGNOSIS — N6081 Other benign mammary dysplasias of right breast: Secondary | ICD-10-CM | POA: Diagnosis not present

## 2020-02-19 MED ORDER — LIDOCAINE-EPINEPHRINE (PF) 1 %-1:200000 IJ SOLN
INTRAMUSCULAR | Status: AC
  Administered 2020-02-19: 30 mL
  Filled 2020-02-19: qty 30

## 2020-02-19 MED ORDER — SODIUM BICARBONATE 4.2 % IV SOLN
INTRAVENOUS | Status: AC
  Administered 2020-02-19: 2.5 meq
  Filled 2020-02-19: qty 10

## 2020-02-19 MED ORDER — LIDOCAINE HCL (PF) 2 % IJ SOLN
INTRAMUSCULAR | Status: AC
  Administered 2020-02-19: 10 mL
  Filled 2020-02-19: qty 10

## 2020-02-19 NOTE — Discharge Instructions (Signed)
Breast Biopsy, Care After These instructions give you information about caring for yourself after your procedure. Your doctor may also give you more specific instructions. Call your doctor if you have any problems or questions after your procedure. What can I expect after the procedure? After your procedure, it is common to have:  Bruising on your breast.  Numbness, tingling, or pain near your biopsy site. Follow these instructions at home: Medicines  Take over-the-counter and prescription medicines only as told by your doctor.  Do not drive for 24 hours if you were given a medicine to help you relax (sedative) during your procedure.  Do not drink alcohol while taking pain medicine.  Do not drive or use heavy machinery while taking prescription pain medicine. Biopsy site care      Follow instructions from your doctor about how to take care of your cut from surgery (incision) or your puncture area. Make sure you: ? Wash your hands with soap and water before you change your bandage (dressing). If you cannot use soap and water, use hand sanitizer. ? Change your bandage as told by your doctor. ? Leave stitches (sutures), skin glue, or skin tape (adhesive strips) in place. They may need to stay in place for 2 weeks or longer. If tape strips get loose and curl up, you may trim the loose edges. Do not remove tape strips completely unless your doctor says it is okay.  If you have stitches, keep them dry when you take a bath or a shower.  Check your cut or puncture area every day for signs of infection. Check for: ? Redness, swelling, or pain. ? Fluid or blood. ? Warmth. ? Pus or a bad smell.  Protect the biopsy area. Do not let the area get bumped. Activity  If you had a cut during your procedure, avoid activities that could pull your cut open. These include: ? Stretching. ? Reaching over your head. ? Exercise. ? Sports. ? Lifting anything that weighs more than 3 lb (1.4  kg).  Return to your normal activities as told by your doctor. Ask your doctor what activities are safe for you. Managing pain, stiffness, and swelling If told, put ice on the biopsy site to relieve swelling:  Put ice in a plastic bag.  Place a towel between your skin and the bag.  Leave the ice on for 20 minutes, 2-3 times a day. General instructions  Continue your normal diet.  Wear a good support bra for as long as told by your doctor.  Get checked for extra fluid around your lymph nodes (lymphedema) as often as told by your doctor.  Keep all follow-up visits as told by your doctor. This is important. Contact a doctor if:  You notice any of the following at the biopsy site: ? More redness, swelling, or pain. ? More fluid or blood coming from the site. ? The site feels warm to the touch. ? Pus or a bad smell coming from the site. ? The site breaks open after the stitches or skin tape strips have been removed.  You have a rash.  You have a fever. Get help right away if:  You have more bleeding from the biopsy site. Get help right away if bleeding is more than a small spot.  You have trouble breathing.  You have red streaks around the biopsy site. Summary  After your procedure, it is common to have bruising, numbness, tingling, or pain near the biopsy site.  Do not drive   or use heavy machinery while taking prescription pain medicine.  Wear a good support bra for as long as told by your doctor.  If you had a cut during your procedure, avoid activities that may pull the cut open. Ask your doctor what activities are safe for you. This information is not intended to replace advice given to you by your health care provider. Make sure you discuss any questions you have with your health care provider. Document Revised: 09/15/2017 Document Reviewed: 09/15/2017 Elsevier Patient Education  2020 [employer]  

## 2020-02-19 NOTE — Sedation Documentation (Signed)
PT tolerated right breast biopsy well today with NAD noted. PT verbalized understanding of discharge instructions. PT ambulated back to the mammogram area this time for imaging of clip placement.

## 2020-02-20 DIAGNOSIS — I129 Hypertensive chronic kidney disease with stage 1 through stage 4 chronic kidney disease, or unspecified chronic kidney disease: Secondary | ICD-10-CM | POA: Diagnosis not present

## 2020-02-20 DIAGNOSIS — D472 Monoclonal gammopathy: Secondary | ICD-10-CM | POA: Diagnosis not present

## 2020-02-20 DIAGNOSIS — N189 Chronic kidney disease, unspecified: Secondary | ICD-10-CM | POA: Diagnosis not present

## 2020-02-20 DIAGNOSIS — D638 Anemia in other chronic diseases classified elsewhere: Secondary | ICD-10-CM | POA: Diagnosis not present

## 2020-02-20 DIAGNOSIS — E1122 Type 2 diabetes mellitus with diabetic chronic kidney disease: Secondary | ICD-10-CM | POA: Diagnosis not present

## 2020-02-20 LAB — SURGICAL PATHOLOGY

## 2020-02-28 DIAGNOSIS — I10 Essential (primary) hypertension: Secondary | ICD-10-CM | POA: Diagnosis not present

## 2020-02-28 DIAGNOSIS — Z0001 Encounter for general adult medical examination with abnormal findings: Secondary | ICD-10-CM | POA: Diagnosis not present

## 2020-02-28 DIAGNOSIS — E1142 Type 2 diabetes mellitus with diabetic polyneuropathy: Secondary | ICD-10-CM | POA: Diagnosis not present

## 2020-02-28 DIAGNOSIS — I951 Orthostatic hypotension: Secondary | ICD-10-CM | POA: Diagnosis not present

## 2020-02-28 DIAGNOSIS — N1831 Chronic kidney disease, stage 3a: Secondary | ICD-10-CM | POA: Diagnosis not present

## 2020-02-28 DIAGNOSIS — R42 Dizziness and giddiness: Secondary | ICD-10-CM | POA: Diagnosis not present

## 2020-02-28 DIAGNOSIS — N183 Chronic kidney disease, stage 3 unspecified: Secondary | ICD-10-CM | POA: Diagnosis not present

## 2020-02-29 DIAGNOSIS — L309 Dermatitis, unspecified: Secondary | ICD-10-CM | POA: Diagnosis not present

## 2020-02-29 DIAGNOSIS — B351 Tinea unguium: Secondary | ICD-10-CM | POA: Diagnosis not present

## 2020-02-29 DIAGNOSIS — E119 Type 2 diabetes mellitus without complications: Secondary | ICD-10-CM | POA: Diagnosis not present

## 2020-02-29 DIAGNOSIS — L84 Corns and callosities: Secondary | ICD-10-CM | POA: Diagnosis not present

## 2020-02-29 DIAGNOSIS — M76821 Posterior tibial tendinitis, right leg: Secondary | ICD-10-CM | POA: Diagnosis not present

## 2020-02-29 DIAGNOSIS — M2141 Flat foot [pes planus] (acquired), right foot: Secondary | ICD-10-CM | POA: Diagnosis not present

## 2020-03-03 DIAGNOSIS — I10 Essential (primary) hypertension: Secondary | ICD-10-CM | POA: Diagnosis not present

## 2020-03-03 DIAGNOSIS — N189 Chronic kidney disease, unspecified: Secondary | ICD-10-CM | POA: Diagnosis not present

## 2020-03-03 DIAGNOSIS — D638 Anemia in other chronic diseases classified elsewhere: Secondary | ICD-10-CM | POA: Diagnosis not present

## 2020-03-03 DIAGNOSIS — I129 Hypertensive chronic kidney disease with stage 1 through stage 4 chronic kidney disease, or unspecified chronic kidney disease: Secondary | ICD-10-CM | POA: Diagnosis not present

## 2020-03-03 DIAGNOSIS — N183 Chronic kidney disease, stage 3 unspecified: Secondary | ICD-10-CM | POA: Diagnosis not present

## 2020-03-03 DIAGNOSIS — E1142 Type 2 diabetes mellitus with diabetic polyneuropathy: Secondary | ICD-10-CM | POA: Diagnosis not present

## 2020-03-03 DIAGNOSIS — I951 Orthostatic hypotension: Secondary | ICD-10-CM | POA: Diagnosis not present

## 2020-03-03 DIAGNOSIS — D472 Monoclonal gammopathy: Secondary | ICD-10-CM | POA: Diagnosis not present

## 2020-03-03 DIAGNOSIS — N170 Acute kidney failure with tubular necrosis: Secondary | ICD-10-CM | POA: Diagnosis not present

## 2020-03-12 DIAGNOSIS — E1122 Type 2 diabetes mellitus with diabetic chronic kidney disease: Secondary | ICD-10-CM | POA: Diagnosis not present

## 2020-03-12 DIAGNOSIS — N189 Chronic kidney disease, unspecified: Secondary | ICD-10-CM | POA: Diagnosis not present

## 2020-03-12 DIAGNOSIS — N170 Acute kidney failure with tubular necrosis: Secondary | ICD-10-CM | POA: Diagnosis not present

## 2020-03-12 DIAGNOSIS — D472 Monoclonal gammopathy: Secondary | ICD-10-CM | POA: Diagnosis not present

## 2020-03-12 DIAGNOSIS — I129 Hypertensive chronic kidney disease with stage 1 through stage 4 chronic kidney disease, or unspecified chronic kidney disease: Secondary | ICD-10-CM | POA: Diagnosis not present

## 2020-03-21 DIAGNOSIS — D638 Anemia in other chronic diseases classified elsewhere: Secondary | ICD-10-CM | POA: Diagnosis not present

## 2020-03-21 DIAGNOSIS — D472 Monoclonal gammopathy: Secondary | ICD-10-CM | POA: Diagnosis not present

## 2020-03-21 DIAGNOSIS — N170 Acute kidney failure with tubular necrosis: Secondary | ICD-10-CM | POA: Diagnosis not present

## 2020-03-21 DIAGNOSIS — N189 Chronic kidney disease, unspecified: Secondary | ICD-10-CM | POA: Diagnosis not present

## 2020-03-21 DIAGNOSIS — I129 Hypertensive chronic kidney disease with stage 1 through stage 4 chronic kidney disease, or unspecified chronic kidney disease: Secondary | ICD-10-CM | POA: Diagnosis not present

## 2020-04-02 DIAGNOSIS — I10 Essential (primary) hypertension: Secondary | ICD-10-CM | POA: Diagnosis not present

## 2020-04-02 DIAGNOSIS — E1142 Type 2 diabetes mellitus with diabetic polyneuropathy: Secondary | ICD-10-CM | POA: Diagnosis not present

## 2020-04-10 ENCOUNTER — Other Ambulatory Visit: Payer: Medicare Other | Admitting: Adult Health

## 2020-04-16 DIAGNOSIS — Z0001 Encounter for general adult medical examination with abnormal findings: Secondary | ICD-10-CM | POA: Diagnosis not present

## 2020-04-16 DIAGNOSIS — I10 Essential (primary) hypertension: Secondary | ICD-10-CM | POA: Diagnosis not present

## 2020-04-16 DIAGNOSIS — E1142 Type 2 diabetes mellitus with diabetic polyneuropathy: Secondary | ICD-10-CM | POA: Diagnosis not present

## 2020-04-16 DIAGNOSIS — N184 Chronic kidney disease, stage 4 (severe): Secondary | ICD-10-CM | POA: Diagnosis not present

## 2020-04-16 DIAGNOSIS — Z1389 Encounter for screening for other disorder: Secondary | ICD-10-CM | POA: Diagnosis not present

## 2020-04-17 DIAGNOSIS — N183 Chronic kidney disease, stage 3 unspecified: Secondary | ICD-10-CM | POA: Diagnosis not present

## 2020-04-17 DIAGNOSIS — I10 Essential (primary) hypertension: Secondary | ICD-10-CM | POA: Diagnosis not present

## 2020-04-17 DIAGNOSIS — Z0001 Encounter for general adult medical examination with abnormal findings: Secondary | ICD-10-CM | POA: Diagnosis not present

## 2020-04-17 DIAGNOSIS — E1142 Type 2 diabetes mellitus with diabetic polyneuropathy: Secondary | ICD-10-CM | POA: Diagnosis not present

## 2020-04-22 DIAGNOSIS — E1142 Type 2 diabetes mellitus with diabetic polyneuropathy: Secondary | ICD-10-CM | POA: Diagnosis not present

## 2020-04-22 DIAGNOSIS — I10 Essential (primary) hypertension: Secondary | ICD-10-CM | POA: Diagnosis not present

## 2020-04-22 DIAGNOSIS — N184 Chronic kidney disease, stage 4 (severe): Secondary | ICD-10-CM | POA: Diagnosis not present

## 2020-04-24 DIAGNOSIS — N184 Chronic kidney disease, stage 4 (severe): Secondary | ICD-10-CM | POA: Diagnosis not present

## 2020-04-24 DIAGNOSIS — E1122 Type 2 diabetes mellitus with diabetic chronic kidney disease: Secondary | ICD-10-CM | POA: Diagnosis not present

## 2020-04-24 DIAGNOSIS — Z794 Long term (current) use of insulin: Secondary | ICD-10-CM | POA: Diagnosis not present

## 2020-04-25 LAB — COMPREHENSIVE METABOLIC PANEL
ALT: 14 IU/L (ref 0–32)
AST: 21 IU/L (ref 0–40)
Albumin/Globulin Ratio: 1.4 (ref 1.2–2.2)
Albumin: 4.1 g/dL (ref 3.6–4.6)
Alkaline Phosphatase: 106 IU/L (ref 44–121)
BUN/Creatinine Ratio: 16 (ref 12–28)
BUN: 21 mg/dL (ref 8–27)
Bilirubin Total: 0.2 mg/dL (ref 0.0–1.2)
CO2: 24 mmol/L (ref 20–29)
Calcium: 9.7 mg/dL (ref 8.7–10.3)
Chloride: 103 mmol/L (ref 96–106)
Creatinine, Ser: 1.34 mg/dL — ABNORMAL HIGH (ref 0.57–1.00)
GFR calc Af Amer: 42 mL/min/{1.73_m2} — ABNORMAL LOW (ref >59)
GFR calc non Af Amer: 36 mL/min/{1.73_m2} — ABNORMAL LOW (ref >59)
Globulin, Total: 3 g/dL (ref 1.5–4.5)
Glucose: 134 mg/dL — ABNORMAL HIGH (ref 65–99)
Potassium: 4.4 mmol/L (ref 3.5–5.2)
Sodium: 143 mmol/L (ref 134–144)
Total Protein: 7.1 g/dL (ref 6.0–8.5)

## 2020-04-25 LAB — HEMOGLOBIN A1C
Est. average glucose Bld gHb Est-mCnc: 143 mg/dL
Hgb A1c MFr Bld: 6.6 % — ABNORMAL HIGH (ref 4.8–5.6)

## 2020-05-01 ENCOUNTER — Ambulatory Visit (INDEPENDENT_AMBULATORY_CARE_PROVIDER_SITE_OTHER): Payer: Medicare Other | Admitting: Nurse Practitioner

## 2020-05-01 ENCOUNTER — Encounter: Payer: Self-pay | Admitting: Nurse Practitioner

## 2020-05-01 ENCOUNTER — Other Ambulatory Visit: Payer: Self-pay

## 2020-05-01 VITALS — BP 144/86 | HR 85 | Ht 62.0 in | Wt 153.6 lb

## 2020-05-01 DIAGNOSIS — I10 Essential (primary) hypertension: Secondary | ICD-10-CM

## 2020-05-01 DIAGNOSIS — Z794 Long term (current) use of insulin: Secondary | ICD-10-CM | POA: Diagnosis not present

## 2020-05-01 DIAGNOSIS — E559 Vitamin D deficiency, unspecified: Secondary | ICD-10-CM

## 2020-05-01 DIAGNOSIS — E1122 Type 2 diabetes mellitus with diabetic chronic kidney disease: Secondary | ICD-10-CM

## 2020-05-01 DIAGNOSIS — N1832 Chronic kidney disease, stage 3b: Secondary | ICD-10-CM

## 2020-05-01 DIAGNOSIS — E782 Mixed hyperlipidemia: Secondary | ICD-10-CM

## 2020-05-01 NOTE — Patient Instructions (Signed)
Diabetes Mellitus and Nutrition, Adult When you have diabetes, or diabetes mellitus, it is very important to have healthy eating habits because your blood sugar (glucose) levels are greatly affected by what you eat and drink. Eating healthy foods in the right amounts, at about the same times every day, can help you:  Control your blood glucose.  Lower your risk of heart disease.  Improve your blood pressure.  Reach or maintain a healthy weight. What can affect my meal plan? Every person with diabetes is different, and each person has different needs for a meal plan. Your health care provider may recommend that you work with a dietitian to make a meal plan that is best for you. Your meal plan may vary depending on factors such as:  The calories you need.  The medicines you take.  Your weight.  Your blood glucose, blood pressure, and cholesterol levels.  Your activity level.  Other health conditions you have, such as heart or kidney disease. How do carbohydrates affect me? Carbohydrates, also called carbs, affect your blood glucose level more than any other type of food. Eating carbs naturally raises the amount of glucose in your blood. Carb counting is a method for keeping track of how many carbs you eat. Counting carbs is important to keep your blood glucose at a healthy level, especially if you use insulin or take certain oral diabetes medicines. It is important to know how many carbs you can safely have in each meal. This is different for every person. Your dietitian can help you calculate how many carbs you should have at each meal and for each snack. How does alcohol affect me? Alcohol can cause a sudden decrease in blood glucose (hypoglycemia), especially if you use insulin or take certain oral diabetes medicines. Hypoglycemia can be a life-threatening condition. Symptoms of hypoglycemia, such as sleepiness, dizziness, and confusion, are similar to symptoms of having too much  alcohol.  Do not drink alcohol if: ? Your health care provider tells you not to drink. ? You are pregnant, may be pregnant, or are planning to become pregnant.  If you drink alcohol: ? Do not drink on an empty stomach. ? Limit how much you use to:  0-1 drink a day for women.  0-2 drinks a day for men. ? Be aware of how much alcohol is in your drink. In the [place] one drink equals one 12 oz bottle of beer (355 mL), one 5 oz glass of wine (148 mL), or one 1 oz glass of hard liquor (44 mL). ? Keep yourself hydrated with water, diet soda, or unsweetened iced tea.  Keep in mind that regular soda, juice, and other mixers may contain a lot of sugar and must be counted as carbs. What are tips for following this plan? Reading food labels  Start by checking the serving size on the "Nutrition Facts" label of packaged foods and drinks. The amount of calories, carbs, fats, and other nutrients listed on the label is based on one serving of the item. Many items contain more than one serving per package.  Check the total grams (g) of carbs in one serving. You can calculate the number of servings of carbs in one serving by dividing the total carbs by 15. For example, if a food has 30 g of total carbs per serving, it would be equal to 2 servings of carbs.  Check the number of grams (g) of saturated fats and trans fats in one serving. Choose foods that have   a low amount or none of these fats.  Check the number of milligrams (mg) of salt (sodium) in one serving. Most people should limit total sodium intake to less than 2,300 mg per day.  Always check the nutrition information of foods labeled as "low-fat" or "nonfat." These foods may be higher in added sugar or refined carbs and should be avoided.  Talk to your dietitian to identify your daily goals for nutrients listed on the label. Shopping  Avoid buying canned, pre-made, or processed foods. These foods tend to be high in fat, sodium, and added  sugar.  Shop around the outside edge of the grocery store. This is where you will most often find fresh fruits and vegetables, bulk grains, fresh meats, and fresh dairy. Cooking  Use low-heat cooking methods, such as baking, instead of high-heat cooking methods like deep frying.  Cook using healthy oils, such as olive, canola, or sunflower oil.  Avoid cooking with butter, cream, or high-fat meats. Meal planning  Eat meals and snacks regularly, preferably at the same times every day. Avoid going long periods of time without eating.  Eat foods that are high in fiber, such as fresh fruits, vegetables, beans, and whole grains. Talk with your dietitian about how many servings of carbs you can eat at each meal.  Eat 4-6 oz (112-168 g) of lean protein each day, such as lean meat, chicken, fish, eggs, or tofu. One ounce (oz) of lean protein is equal to: ? 1 oz (28 g) of meat, chicken, or fish. ? 1 egg. ?  cup (62 g) of tofu.  Eat some foods each day that contain healthy fats, such as avocado, nuts, seeds, and fish.   What foods should I eat? Fruits Berries. Apples. Oranges. Peaches. Apricots. Plums. Grapes. Mango. Papaya. Pomegranate. Kiwi. Cherries. Vegetables Lettuce. Spinach. Leafy greens, including kale, chard, collard greens, and mustard greens. Beets. Cauliflower. Cabbage. Broccoli. Carrots. Green beans. Tomatoes. Peppers. Onions. Cucumbers. Brussels sprouts. Grains Whole grains, such as whole-wheat or whole-grain bread, crackers, tortillas, cereal, and pasta. Unsweetened oatmeal. Quinoa. Kling or wild rice. Meats and other proteins Seafood. Poultry without skin. Lean cuts of poultry and beef. Tofu. Nuts. Seeds. Dairy Low-fat or fat-free dairy products such as milk, yogurt, and cheese. The items listed above may not be a complete list of foods and beverages you can eat. Contact a dietitian for more information. What foods should I avoid? Fruits Fruits canned with  syrup. Vegetables Canned vegetables. Frozen vegetables with butter or cream sauce. Grains Refined white flour and flour products such as bread, pasta, snack foods, and cereals. Avoid all processed foods. Meats and other proteins Fatty cuts of meat. Poultry with skin. Breaded or fried meats. Processed meat. Avoid saturated fats. Dairy Full-fat yogurt, cheese, or milk. Beverages Sweetened drinks, such as soda or iced tea. The items listed above may not be a complete list of foods and beverages you should avoid. Contact a dietitian for more information. Questions to ask a health care provider  Do I need to meet with a diabetes educator?  Do I need to meet with a dietitian?  What number can I call if I have questions?  When are the best times to check my blood glucose? Where to find more information:  American Diabetes Association: diabetes.org  Academy of Nutrition and Dietetics: www.eatright.org  National Institute of Diabetes and Digestive and Kidney Diseases: www.niddk.nih.gov  Association of Diabetes Care and Education Specialists: www.diabeteseducator.org Summary  It is important to have healthy eating   habits because your blood sugar (glucose) levels are greatly affected by what you eat and drink.  A healthy meal plan will help you control your blood glucose and maintain a healthy lifestyle.  Your health care provider may recommend that you work with a dietitian to make a meal plan that is best for you.  Keep in mind that carbohydrates (carbs) and alcohol have immediate effects on your blood glucose levels. It is important to count carbs and to use alcohol carefully. This information is not intended to replace advice given to you by your health care provider. Make sure you discuss any questions you have with your health care provider. Document Revised: 03/06/2019 Document Reviewed: 03/06/2019 Elsevier Patient Education  2021 Elsevier Inc.  

## 2020-05-01 NOTE — Progress Notes (Signed)
Endocrinology follow-up  Note       05/01/2020, 9:58 AM   Subjective:    Patient ID: Amy Stein, female    DOB: 09-09-1933.  Amy Stein is being seen in follow-up for management of currently uncontrolled symptomatic type 2 diabetes, hypertension, hyperlipidemia. PMD:   Rosita Fire, MD.   Past Medical History:  Diagnosis Date  . Diabetes mellitus   . Hypertension   . Shingles    on both legs 1/18   Past Surgical History:  Procedure Laterality Date  . COLONOSCOPY    . COLONOSCOPY N/A 02/23/2017   Procedure: COLONOSCOPY;  Surgeon: Daneil Dolin, MD;  Location: [facility];  Service: Endoscopy;  Laterality: N/A;  9:30 Am  . POLYPECTOMY  02/23/2017   Procedure: POLYPECTOMY;  Surgeon: Daneil Dolin, MD;  Location: [facility];  Service: Endoscopy;;  cecum   Social History   Socioeconomic History  . Marital status: Single    Spouse name: Not on file  . Number of children: Not on file  . Years of education: Not on file  . Highest education level: Not on file  Occupational History  . Not on file  Tobacco Use  . Smoking status: Former Research scientist (life sciences)  . Smokeless tobacco: Never Used  Vaping Use  . Vaping Use: Never used  Substance and Sexual Activity  . Alcohol use: No  . Drug use: No  . Sexual activity: Yes    Birth control/protection: Post-menopausal  Other Topics Concern  . Not on file  Social History Narrative  . Not on file   Social Determinants of Health   Financial Resource Strain: Not on file  Food Insecurity: Not on file  Transportation Needs: Not on file  Physical Activity: Not on file  Stress: Not on file  Social Connections: Not on file   Outpatient Encounter Medications as of 05/01/2020  Medication Sig  . ACCU-CHEK GUIDE test strip USE TO TEST THREE TIMESCDAILY.  Marland Kitchen amLODipine (NORVASC) 5 MG tablet Take 5 mg by mouth daily.  Marland Kitchen aspirin EC 81 MG tablet Take 81 mg by mouth  daily.  . ferrous sulfate 325 (65 FE) MG tablet Take 1 tablet (325 mg total) by mouth 2 (two) times daily with a meal.  . gabapentin (NEURONTIN) 400 MG capsule Take 400 mg by mouth 3 (three) times daily.   Marland Kitchen HM VITAMIN D3 100 MCG (4000 UT) CAPS TAKE ONE CAPSULE (5000 UNITS TOTAL) BY MOUTH DAILY  . Insulin Glargine (LANTUS SOLOSTAR) 100 UNIT/ML Solostar Pen Inject 35 Units into the skin at bedtime.  . Insulin Pen Needle (PEN NEEDLES) 32G X 4 MM MISC 1 each by Does not apply route at bedtime.  . medroxyPROGESTERone (PROVERA) 10 MG tablet Take 1 tablet (10 mg total) by mouth See admin instructions. Take 10 mg by mouth daily for the first 10 days of the month  . omeprazole (PRILOSEC) 20 MG capsule Take 20 mg by mouth daily.   Marland Kitchen oxybutynin (DITROPAN-XL) 10 MG 24 hr tablet Take 10 mg by mouth daily.  . simvastatin (ZOCOR) 40 MG tablet Take 40 mg by mouth at bedtime.   . TRADJENTA 5 MG TABS  tablet Take 5 mg by mouth daily.  . vitamin B-12 1000 MCG tablet Take 1 tablet (1,000 mcg total) by mouth daily.  . hydrochlorothiazide (MICROZIDE) 12.5 MG capsule Take 12.5 mg by mouth daily. (Patient not taking: Reported on 05/01/2020)  . labetalol (NORMODYNE) 100 MG tablet Take 100 mg by mouth 2 (two) times daily.  (Patient not taking: Reported on 05/01/2020)  . losartan (COZAAR) 100 MG tablet Take 100 mg by mouth daily. (Patient not taking: Reported on 05/01/2020)   No facility-administered encounter medications on file as of 05/01/2020.    ALLERGIES: Allergies  Allergen Reactions  . Sulfa Antibiotics   . Sulfonamide Derivatives Rash    VACCINATION STATUS:  There is no immunization history on file for this patient.  Diabetes She presents for her follow-up diabetic visit. She has type 2 diabetes mellitus. Onset time: She was diagnosed at approximate age of 89 years. Her disease course has been improving. Hypoglycemia symptoms include nervousness/anxiousness, sweats and tremors. Pertinent negatives for  hypoglycemia include no confusion, headaches, pallor or seizures. There are no diabetic associated symptoms. Pertinent negatives for diabetes include no chest pain, no polydipsia, no polyphagia and no polyuria. There are no hypoglycemic complications. Symptoms are improving. Diabetic complications include nephropathy. Risk factors for coronary artery disease include diabetes mellitus, hypertension, sedentary lifestyle, tobacco exposure, dyslipidemia and post-menopausal. Current diabetic treatment includes insulin injections. She is compliant with treatment all of the time. Her weight is decreasing steadily. She is following a generally unhealthy diet. When asked about meal planning, she reported none. She has not had a previous visit with a dietitian. She rarely participates in exercise. Her home blood glucose trend is decreasing steadily. Her overall blood glucose range is 140-180 mg/dl. (She presents today with her meter and logs to review.  Her previsit A1c was 6.6%, improving from last visit of 6.9%.  There may be a discrepancy with her A1c and her glucose readings due to anemia of chronic disease.  Analysis of her meter shows 7-day average of 177, 14- day average of 169, 30-day average of 165, and 90-day average of 139.  There are no documented episodes of hypoglycemia.) An ACE inhibitor/angiotensin II receptor blocker is not being taken (was taken off by nephrologist). She does not see a podiatrist.Eye exam is current.  Hypertension This is a chronic problem. The current episode started more than 1 year ago. The problem has been gradually improving since onset. The problem is controlled. Associated symptoms include sweats. Pertinent negatives include no chest pain, headaches, palpitations or shortness of breath. There are no associated agents to hypertension. Risk factors for coronary artery disease include diabetes mellitus, sedentary lifestyle, smoking/tobacco exposure, post-menopausal state and  dyslipidemia. Past treatments include diuretics, calcium channel blockers and beta blockers. The current treatment provides moderate improvement. Compliance problems include exercise.  Hypertensive end-organ damage includes kidney disease. Identifiable causes of hypertension include chronic renal disease.   Review of systems  Constitutional: + Minimally fluctuating body weight,  current Body mass index is 28.09 kg/m. , no fatigue, no subjective hyperthermia, no subjective hypothermia Eyes: no blurry vision, no xerophthalmia ENT: no sore throat, no nodules palpated in throat, no dysphagia/odynophagia, no hoarseness Cardiovascular: no chest pain, no shortness of breath, no palpitations, no leg swelling Respiratory: no cough, no shortness of breath Gastrointestinal: no nausea/vomiting/diarrhea Musculoskeletal: no muscle/joint aches Skin: no rashes, no hyperemia Neurological: no tremors, no numbness, no tingling, no dizziness Psychiatric: no depression, no anxiety  Objective:    BP (!) 144/86 (BP  Location: Left Arm)   Pulse 85   Ht 5\' 2"  (1.575 m)   Wt 153 lb 9.6 oz (69.7 kg)   BMI 28.09 kg/m   Wt Readings from Last 3 Encounters:  05/01/20 153 lb 9.6 oz (69.7 kg)  02/12/20 158 lb (71.7 kg)  01/24/20 158 lb (71.7 kg)    BP Readings from Last 3 Encounters:  05/01/20 (!) 144/86  02/19/20 124/70  02/12/20 114/67    Physical Exam- Limited  Constitutional:  Body mass index is 28.09 kg/m. , not in acute distress, normal state of mind Eyes:  EOMI, no exophthalmos Neck: Supple Thyroid: No gross goiter Cardiovascular: RRR, no murmers, rubs, or gallops, no edema Respiratory: Adequate breathing efforts, no crackles, rales, rhonchi, or wheezing Musculoskeletal: no gross deformities, strength intact in all four extremities, no gross restriction of joint movements Skin:  no rashes, no hyperemia Neurological: no tremor with outstretched hands   POCT ABI Results 05/01/20   Right ABI:   1.28      Left ABI:  1.21  Right leg systolic / diastolic: 762/83 mmHg Left leg systolic / diastolic: 151/76 mmHg  Arm systolic / diastolic: 160/73 mmHG  Detailed report will be scanned into patient chart.    CMP     Component Value Date/Time   NA 143 04/24/2020 1044   K 4.4 04/24/2020 1044   CL 103 04/24/2020 1044   CO2 24 04/24/2020 1044   GLUCOSE 134 (H) 04/24/2020 1044   GLUCOSE 147 (H) 09/18/2019 0848   BUN 21 04/24/2020 1044   CREATININE 1.34 (H) 04/24/2020 1044   CREATININE 2.00 (H) 09/18/2019 0848   CALCIUM 9.7 04/24/2020 1044   PROT 7.1 04/24/2020 1044   ALBUMIN 4.1 04/24/2020 1044   AST 21 04/24/2020 1044   ALT 14 04/24/2020 1044   ALKPHOS 106 04/24/2020 1044   BILITOT 0.2 04/24/2020 1044   GFRNONAA 36 (L) 04/24/2020 1044   GFRNONAA 23 (L) 09/18/2019 0848   GFRAA 42 (L) 04/24/2020 1044   GFRAA 26 (L) 09/18/2019 0848    Diabetic Labs (most recent): Lab Results  Component Value Date   HGBA1C 6.6 (H) 04/24/2020   HGBA1C 6.9 (A) 12/31/2019   HGBA1C 7.1 (A) 09/27/2019      Lab Results  Component Value Date   TSH 1.68 09/18/2019   TSH 1.29 01/31/2018   TSH 0.793 05/26/2017   FREET4 0.9 09/18/2019   FREET4 0.9 01/31/2018   Lipid Panel     Component Value Date/Time   CHOL 129 09/18/2019 0848   TRIG 143 09/18/2019 0848   HDL 39 (L) 09/18/2019 0848   CHOLHDL 3.3 09/18/2019 0848   LDLCALC 67 09/18/2019 0848      Assessment & Plan:   1) Type 2 diabetes mellitus with stage 3 chronic kidney disease, with long-term current use of insulin ([place])  - Amy Stein has currently uncontrolled symptomatic type 2 DM since 67 years of ago.    She presents today with her meter and logs to review.  Her previsit A1c was 6.6%, improving from last visit of 6.9%.  There may be a discrepancy with her A1c and her glucose readings due to anemia of chronic disease.  Analysis of her meter shows 7-day average of 177, 14- day average of 169, 30-day average of 165, and  90-day average of 139.  There are no documented episodes of hypoglycemia.  - Recent labs reviewed.  -her diabetes is complicated by stage 4 renal insufficiency and Amy Stein remains  at a high risk for more acute and chronic complications which include CAD, CVA, CKD, retinopathy, and neuropathy. These are all discussed in detail with the patient.  - Nutritional counseling repeated at each appointment due to patients tendency to fall back in to old habits.  - The patient admits there is a room for improvement in their diet and drink choices. -  Suggestion is made for the patient to avoid simple carbohydrates from their diet including Cakes, Sweet Desserts / Pastries, Ice Cream, Soda (diet and regular), Sweet Tea, Candies, Chips, Cookies, Sweet Pastries,  Store Bought Juices, Alcohol in Excess of  1-2 drinks a day, Artificial Sweeteners, Coffee Creamer, and "Sugar-free" Products. This will help patient to have stable blood glucose profile and potentially avoid unintended weight gain.   - I encouraged the patient to switch to  unprocessed or minimally processed complex starch and increased protein intake (animal or plant source), fruits, and vegetables.   - Patient is advised to stick to a routine mealtimes to eat 3 meals  a day and avoid unnecessary snacks ( to snack only to correct hypoglycemia).  - I have approached her with the following individualized plan to manage diabetes and patient agrees:   -She is elderly patient with several comorbidities,  #1 priority and treating her diabetes would be to avoid hypoglycemia.    -Given her stable A1c of 6.6%, she would not need prandial insulin for now.   -She is advised to continue Lantus 35 units SQ daily at bedtime and Tradjenta 5 mg po daily.    -She is encouraged to continue monitoring blood glucose at least twice daily, before breakfast and before bed and to call the clinic if blood glucose is below 70 or greater than 200 for 3 tests in a  row.  -She is not a candidate for metformin, SGLT2 inhibitors due to CKD.  -She will be considered for low-dose glipizide if she loses control during subsequent visits.  -- Patient specific target  A1c;  LDL, HDL, Triglycerides, and  Waist Circumference were discussed in detail.  2) BP/HTN:  Her blood pressure is controlled to target for her age.  She is advised to continue Amlodipine 5 mg po daily, (nephrologist stopped Losartan at last visit).  3) Lipids/HPL:    Her most recent lipid panel from 09/18/19 shows controlled LDL of 67 and triglycerides of 143.  She is advised to continue Simvastatin 40 mg po daily at bedtime.  Side effects and precautions discussed with her.  4)  Weight/Diet:  Her Body mass index is 28.09 kg/m.-may benefit from minimal weight loss.  CDE Consult will be initiated , exercise, and detailed carbohydrates information provided.  5) Vitamin D deficiency- -Her most recent vitamin D level was 73 on 09/18/19.  She is advised to continue daily maintenance dose of vitamin D 3 4000 units daily.  6) Chronic Care/Health Maintenance: -she is on Statin medications and is encouraged to initiate and continue to follow up with Ophthalmology, Dentist, Podiatrist at least yearly or according to recommendations, and advised to  stay away from smoking. I have recommended yearly flu vaccine and pneumonia vaccine at least every 5 years; moderate intensity exercise for up to 150 minutes weekly; and  sleep for at least 7 hours a day.  - I advised patient to maintain close follow up with Rosita Fire, MD for primary care needs.  - Time spent on this patient care encounter:  35 min, of which > 50% was spent in  counseling and the rest reviewing her blood glucose logs , discussing her hypoglycemia and hyperglycemia episodes, reviewing her current and  previous labs / studies  ( including abstraction from other facilities) and medications  doses and developing a  long term treatment plan and  documenting her care.   Please refer to Patient Instructions for Blood Glucose Monitoring and Insulin/Medications Dosing Guide"  in media tab for additional information. Please  also refer to " Patient Self Inventory" in the Media  tab for reviewed elements of pertinent patient history.  Amy Stein participated in the discussions, expressed understanding, and voiced agreement with the above plans.  All questions were answered to her satisfaction. she is encouraged to contact clinic should she have any questions or concerns prior to her return visit.   Follow up plan: - Return in about 4 months (around 08/29/2020) for Diabetes follow up- A1c and urine micro in office, No previsit labs, Bring glucometer and logs.  Rayetta Pigg, [facility] [address] [place],  [address] Phone: [phone] Fax: [phone]  05/01/2020, 9:58 AM

## 2020-05-06 ENCOUNTER — Ambulatory Visit
Admission: EM | Admit: 2020-05-06 | Discharge: 2020-05-06 | Disposition: A | Discharge status: Home / Self Care | Payer: Medicare Other | Attending: Emergency Medicine | Admitting: Emergency Medicine

## 2020-05-06 ENCOUNTER — Encounter: Payer: Self-pay | Admitting: Emergency Medicine

## 2020-05-06 ENCOUNTER — Other Ambulatory Visit: Payer: Self-pay

## 2020-05-06 DIAGNOSIS — H6591 Unspecified nonsuppurative otitis media, right ear: Secondary | ICD-10-CM | POA: Diagnosis not present

## 2020-05-06 DIAGNOSIS — H60391 Other infective otitis externa, right ear: Secondary | ICD-10-CM | POA: Diagnosis not present

## 2020-05-06 MED ORDER — FLUTICASONE PROPIONATE 50 MCG/ACT NA SUSP: 1.0000 | Freq: Every day | NASAL | 0 refills | Status: AC

## 2020-05-06 MED ORDER — NEOMYCIN-POLYMYXIN-HC 3.5-10000-1 OT SUSP: 4.0000 [drp] | Freq: Three times a day (TID) | OTIC | 0 refills | Status: DC

## 2020-05-06 NOTE — ED Provider Notes (Signed)
[facility]   [id number] 05/06/20 Arrival Time: 0737  CC:EAR PAIN  SUBJECTIVE: History from: patient.  Amy Stein is a 87 y.o. female who presented to the urgent care with a complaint of right ear pain for the past 3 weeks.  Denies a precipitating event, such as swimming or wearing ear plugs.  Patient states the pain is constant and achy in character.  Has not tried any OTC medication.  Denies alleviating or aggravating factor.  Denies similar symptoms in the past.    Denies fever, chills, fatigue, sinus pain, rhinorrhea, sore throat, SOB, wheezing, chest pain, nausea, changes in bowel or bladder habits.    ROS: As per HPI.  All other pertinent ROS negative.     Past Medical History:  Diagnosis Date  . Diabetes mellitus   . Hypertension   . Shingles    on both legs 1/18   Past Surgical History:  Procedure Laterality Date  . COLONOSCOPY    . COLONOSCOPY N/A 02/23/2017   Procedure: COLONOSCOPY;  Surgeon: Daneil Dolin, MD;  Location: [facility];  Service: Endoscopy;  Laterality: N/A;  9:30 Am  . POLYPECTOMY  02/23/2017   Procedure: POLYPECTOMY;  Surgeon: Daneil Dolin, MD;  Location: [facility];  Service: Endoscopy;;  cecum   Allergies  Allergen Reactions  . Sulfa Antibiotics   . Sulfonamide Derivatives Rash   No current facility-administered medications on file prior to encounter.   Current Outpatient Medications on File Prior to Encounter  Medication Sig Dispense Refill  . ACCU-CHEK GUIDE test strip USE TO TEST THREE TIMESCDAILY.    Marland Kitchen amLODipine (NORVASC) 5 MG tablet Take 5 mg by mouth daily.    Marland Kitchen aspirin EC 81 MG tablet Take 81 mg by mouth daily.    . ferrous sulfate 325 (65 FE) MG tablet Take 1 tablet (325 mg total) by mouth 2 (two) times daily with a meal. 60 tablet 0  . gabapentin (NEURONTIN) 400 MG capsule Take 400 mg by mouth 3 (three) times daily.     Marland Kitchen HM VITAMIN D3 100 MCG (4000 UT) CAPS TAKE ONE CAPSULE (5000 UNITS TOTAL) BY MOUTH DAILY 100  capsule 0  . hydrochlorothiazide (MICROZIDE) 12.5 MG capsule Take 12.5 mg by mouth daily. (Patient not taking: Reported on 05/01/2020)    . Insulin Glargine (LANTUS SOLOSTAR) 100 UNIT/ML Solostar Pen Inject 35 Units into the skin at bedtime. 15 mL 1  . Insulin Pen Needle (PEN NEEDLES) 32G X 4 MM MISC 1 each by Does not apply route at bedtime. 100 each 3  . labetalol (NORMODYNE) 100 MG tablet Take 100 mg by mouth 2 (two) times daily.  (Patient not taking: Reported on 05/01/2020)    . losartan (COZAAR) 100 MG tablet Take 100 mg by mouth daily. (Patient not taking: Reported on 05/01/2020)    . medroxyPROGESTERone (PROVERA) 10 MG tablet Take 1 tablet (10 mg total) by mouth See admin instructions. Take 10 mg by mouth daily for the first 10 days of the month 30 tablet 4  . omeprazole (PRILOSEC) 20 MG capsule Take 20 mg by mouth daily.     Marland Kitchen oxybutynin (DITROPAN-XL) 10 MG 24 hr tablet Take 10 mg by mouth daily.    . simvastatin (ZOCOR) 40 MG tablet Take 40 mg by mouth at bedtime.     . TRADJENTA 5 MG TABS tablet Take 5 mg by mouth daily.    . vitamin B-12 1000 MCG tablet Take 1 tablet (1,000 mcg total)  by mouth daily. 30 tablet 0   Social History   Socioeconomic History  . Marital status: Single    Spouse name: Not on file  . Number of children: Not on file  . Years of education: Not on file  . Highest education level: Not on file  Occupational History  . Not on file  Tobacco Use  . Smoking status: Former Research scientist (life sciences)  . Smokeless tobacco: Never Used  Vaping Use  . Vaping Use: Never used  Substance and Sexual Activity  . Alcohol use: No  . Drug use: No  . Sexual activity: Yes    Birth control/protection: Post-menopausal  Other Topics Concern  . Not on file  Social History Narrative  . Not on file   Social Determinants of Health   Financial Resource Strain: Not on file  Food Insecurity: Not on file  Transportation Needs: Not on file  Physical Activity: Not on file  Stress: Not on file   Social Connections: Not on file  Intimate Partner Violence: Not on file   Family History  Problem Relation Age of Onset  . Throat cancer Father   . Pneumonia Mother   . Pancreatic cancer Brother     OBJECTIVE:  Vitals:   05/06/20 1230  BP: (!) 148/78  Pulse: 72  Resp: 15  Temp: 99 F (37.2 C)  SpO2: 97%     Physical Exam Vitals and nursing note reviewed.  Constitutional:      General: She is not in acute distress.    Appearance: Normal appearance. She is normal weight. She is not ill-appearing, toxic-appearing or diaphoretic.  HENT:     Head: Normocephalic.     Right Ear: Tenderness present. A middle ear effusion is present.     Ears:     Comments: Tenderness in right ear canal Cardiovascular:     Rate and Rhythm: Normal rate and regular rhythm.     Pulses: Normal pulses.     Heart sounds: Normal heart sounds. No murmur heard. No friction rub. No gallop.   Pulmonary:     Effort: Pulmonary effort is normal. No respiratory distress.     Breath sounds: Normal breath sounds. No stridor. No wheezing, rhonchi or rales.  Chest:     Chest wall: No tenderness.  Neurological:     Mental Status: She is alert and oriented to person, place, and time.     Imaging: No results found.   ASSESSMENT & PLAN:  1. Middle ear effusion, right     Meds ordered this encounter  Medications  . fluticasone (FLONASE) 50 MCG/ACT nasal spray    Sig: Place 1 spray into both nostrils daily for 14 days.    Dispense:  16 g    Refill:  0  . neomycin-polymyxin-hydrocortisone (CORTISPORIN) 3.5-10000-1 OTIC suspension    Sig: Place 4 drops into the right ear 3 (three) times daily.    Dispense:  10 mL    Refill:  0    Discharge instructions  Rest and drink plenty of fluids Prescribed Flonase  Prescribed Cortisporin ear drops Take medications as directed and to completion Continue to use OTC ibuprofen and/ or tylenol as needed for pain control Follow up with PCP if symptoms  persists Return here or go to the ER if you have any new or worsening symptoms   Reviewed expectations re: course of current medical issues. Questions answered. Outlined signs and symptoms indicating need for more acute intervention. Patient verbalized understanding. After Visit Summary given.  Emerson Monte, FNP 05/06/20 1336

## 2020-05-06 NOTE — ED Triage Notes (Signed)
Patient states that shes having trouble with her right ear x 3 weeks. pain periodically ear canal looks clear, ear sore from wearing mask

## 2020-05-06 NOTE — Discharge Instructions (Addendum)
Rest and drink plenty of fluids Prescribed Flonase  Prescribed Cortisporin ear drops Take medications as directed and to completion Continue to use OTC ibuprofen and/ or tylenol as needed for pain control Follow up with PCP if symptoms persists Return here or go to the ER if you have any new or worsening symptoms

## 2020-05-16 ENCOUNTER — Other Ambulatory Visit: Payer: Medicare Other | Admitting: Adult Health

## 2020-05-20 DIAGNOSIS — E119 Type 2 diabetes mellitus without complications: Secondary | ICD-10-CM | POA: Diagnosis not present

## 2020-05-23 DIAGNOSIS — E1142 Type 2 diabetes mellitus with diabetic polyneuropathy: Secondary | ICD-10-CM | POA: Diagnosis not present

## 2020-05-23 DIAGNOSIS — I10 Essential (primary) hypertension: Secondary | ICD-10-CM | POA: Diagnosis not present

## 2020-05-28 DIAGNOSIS — D472 Monoclonal gammopathy: Secondary | ICD-10-CM | POA: Diagnosis not present

## 2020-05-28 DIAGNOSIS — E1122 Type 2 diabetes mellitus with diabetic chronic kidney disease: Secondary | ICD-10-CM | POA: Diagnosis not present

## 2020-05-28 DIAGNOSIS — N170 Acute kidney failure with tubular necrosis: Secondary | ICD-10-CM | POA: Diagnosis not present

## 2020-05-28 DIAGNOSIS — I129 Hypertensive chronic kidney disease with stage 1 through stage 4 chronic kidney disease, or unspecified chronic kidney disease: Secondary | ICD-10-CM | POA: Diagnosis not present

## 2020-05-28 DIAGNOSIS — N189 Chronic kidney disease, unspecified: Secondary | ICD-10-CM | POA: Diagnosis not present

## 2020-06-02 DIAGNOSIS — E1122 Type 2 diabetes mellitus with diabetic chronic kidney disease: Secondary | ICD-10-CM | POA: Diagnosis not present

## 2020-06-02 DIAGNOSIS — D472 Monoclonal gammopathy: Secondary | ICD-10-CM | POA: Diagnosis not present

## 2020-06-02 DIAGNOSIS — N189 Chronic kidney disease, unspecified: Secondary | ICD-10-CM | POA: Diagnosis not present

## 2020-06-02 DIAGNOSIS — I129 Hypertensive chronic kidney disease with stage 1 through stage 4 chronic kidney disease, or unspecified chronic kidney disease: Secondary | ICD-10-CM | POA: Diagnosis not present

## 2020-06-02 DIAGNOSIS — N170 Acute kidney failure with tubular necrosis: Secondary | ICD-10-CM | POA: Diagnosis not present

## 2020-06-20 DIAGNOSIS — I10 Essential (primary) hypertension: Secondary | ICD-10-CM | POA: Diagnosis not present

## 2020-06-20 DIAGNOSIS — E1142 Type 2 diabetes mellitus with diabetic polyneuropathy: Secondary | ICD-10-CM | POA: Diagnosis not present

## 2020-07-21 DIAGNOSIS — E1142 Type 2 diabetes mellitus with diabetic polyneuropathy: Secondary | ICD-10-CM | POA: Diagnosis not present

## 2020-07-21 DIAGNOSIS — I10 Essential (primary) hypertension: Secondary | ICD-10-CM | POA: Diagnosis not present

## 2020-07-31 ENCOUNTER — Other Ambulatory Visit (HOSPITAL_COMMUNITY)
Admission: RE | Admit: 2020-07-31 | Discharge: 2020-07-31 | Disposition: A | Discharge status: Home / Self Care | Payer: Medicare Other | Source: Ambulatory Visit | Attending: Nephrology | Admitting: Nephrology

## 2020-07-31 DIAGNOSIS — N189 Chronic kidney disease, unspecified: Secondary | ICD-10-CM | POA: Diagnosis not present

## 2020-07-31 DIAGNOSIS — E1122 Type 2 diabetes mellitus with diabetic chronic kidney disease: Secondary | ICD-10-CM | POA: Diagnosis not present

## 2020-07-31 DIAGNOSIS — I129 Hypertensive chronic kidney disease with stage 1 through stage 4 chronic kidney disease, or unspecified chronic kidney disease: Secondary | ICD-10-CM | POA: Insufficient documentation

## 2020-07-31 DIAGNOSIS — N170 Acute kidney failure with tubular necrosis: Secondary | ICD-10-CM | POA: Insufficient documentation

## 2020-07-31 DIAGNOSIS — D472 Monoclonal gammopathy: Secondary | ICD-10-CM | POA: Diagnosis not present

## 2020-07-31 DIAGNOSIS — E876 Hypokalemia: Secondary | ICD-10-CM | POA: Diagnosis not present

## 2020-07-31 DIAGNOSIS — I9589 Other hypotension: Secondary | ICD-10-CM | POA: Diagnosis not present

## 2020-07-31 LAB — RENAL FUNCTION PANEL
Albumin: 3.9 g/dL (ref 3.5–5.0)
Anion gap: 9 (ref 5–15)
BUN: 29 mg/dL — ABNORMAL HIGH (ref 8–23)
CO2: 25 mmol/L (ref 22–32)
Calcium: 9.2 mg/dL (ref 8.9–10.3)
Chloride: 104 mmol/L (ref 98–111)
Creatinine, Ser: 1.68 mg/dL — ABNORMAL HIGH (ref 0.44–1.00)
GFR, Estimated: 30 mL/min — ABNORMAL LOW (ref >60)
Glucose, Bld: 101 mg/dL — ABNORMAL HIGH (ref 70–99)
Phosphorus: 3.8 mg/dL (ref 2.5–4.6)
Potassium: 3.4 mmol/L — ABNORMAL LOW (ref 3.5–5.1)
Sodium: 138 mmol/L (ref 135–145)

## 2020-07-31 LAB — PROTEIN / CREATININE RATIO, URINE
Creatinine, Urine: 142.41 mg/dL
Protein Creatinine Ratio: 0.09 mg/mg{Cre} (ref 0.00–0.15)
Total Protein, Urine: 13 mg/dL

## 2020-07-31 LAB — CBC
HCT: 38.4 % (ref 36.0–46.0)
Hemoglobin: 12.1 g/dL (ref 12.0–15.0)
MCH: 28.7 pg (ref 26.0–34.0)
MCHC: 31.5 g/dL (ref 30.0–36.0)
MCV: 91.2 fL (ref 80.0–100.0)
Platelets: 164 10*3/uL (ref 150–400)
RBC: 4.21 MIL/uL (ref 3.87–5.11)
RDW: 14.4 % (ref 11.5–15.5)
WBC: 6.1 10*3/uL (ref 4.0–10.5)
nRBC: 0 % (ref 0.0–0.2)

## 2020-08-04 LAB — PROTEIN ELECTROPHORESIS, SERUM
A/G Ratio: 1 (ref 0.7–1.7)
Albumin ELP: 3.6 g/dL (ref 2.9–4.4)
Alpha-1-Globulin: 0.2 g/dL (ref 0.0–0.4)
Alpha-2-Globulin: 0.8 g/dL (ref 0.4–1.0)
Beta Globulin: 1 g/dL (ref 0.7–1.3)
Gamma Globulin: 1.6 g/dL (ref 0.4–1.8)
Globulin, Total: 3.6 g/dL (ref 2.2–3.9)
Total Protein ELP: 7.2 g/dL (ref 6.0–8.5)

## 2020-08-15 ENCOUNTER — Other Ambulatory Visit: Payer: Self-pay

## 2020-08-15 ENCOUNTER — Encounter: Payer: Self-pay | Admitting: Obstetrics & Gynecology

## 2020-08-15 ENCOUNTER — Ambulatory Visit (INDEPENDENT_AMBULATORY_CARE_PROVIDER_SITE_OTHER): Payer: Medicare Other | Admitting: Obstetrics & Gynecology

## 2020-08-15 VITALS — BP 132/84 | HR 87 | Ht 62.0 in | Wt 149.0 lb

## 2020-08-15 DIAGNOSIS — Z1211 Encounter for screening for malignant neoplasm of colon: Secondary | ICD-10-CM | POA: Diagnosis not present

## 2020-08-15 DIAGNOSIS — Z1212 Encounter for screening for malignant neoplasm of rectum: Secondary | ICD-10-CM

## 2020-08-15 DIAGNOSIS — Z01419 Encounter for gynecological examination (general) (routine) without abnormal findings: Secondary | ICD-10-CM

## 2020-08-15 DIAGNOSIS — Z7689 Persons encountering health services in other specified circumstances: Secondary | ICD-10-CM | POA: Diagnosis not present

## 2020-08-15 NOTE — Progress Notes (Signed)
Subjective:     Amy Stein is a 87 y.o. female here for a routine exam.  No LMP recorded. Patient is postmenopausal. G4P4 Birth Control Method:  Post menopausal Menstrual Calendar(currently): amenorrheic  Current complaints: none.   Current acute medical issues:  none   Recent Gynecologic History No LMP recorded. Patient is postmenopausal. Last Pap: n/a,   Last mammogram: 10/21,  Fibrocystic changes  Past Medical History:  Diagnosis Date  . Diabetes mellitus   . Hypertension   . Shingles    on both legs 1/18    Past Surgical History:  Procedure Laterality Date  . COLONOSCOPY    . COLONOSCOPY N/A 02/23/2017   Procedure: COLONOSCOPY;  Surgeon: Daneil Dolin, MD;  Location: [facility];  Service: Endoscopy;  Laterality: N/A;  9:30 Am  . POLYPECTOMY  02/23/2017   Procedure: POLYPECTOMY;  Surgeon: Daneil Dolin, MD;  Location: [facility];  Service: Endoscopy;;  cecum    OB History    Gravida  4   Para  4   Term      Preterm      AB      Living  4     SAB      IAB      Ectopic      Multiple      Live Births              Social History   Socioeconomic History  . Marital status: Single    Spouse name: Not on file  . Number of children: Not on file  . Years of education: Not on file  . Highest education level: Not on file  Occupational History  . Not on file  Tobacco Use  . Smoking status: Former Research scientist (life sciences)  . Smokeless tobacco: Never Used  Vaping Use  . Vaping Use: Never used  Substance and Sexual Activity  . Alcohol use: No  . Drug use: No  . Sexual activity: Yes    Birth control/protection: Post-menopausal  Other Topics Concern  . Not on file  Social History Narrative  . Not on file   Social Determinants of Health   Financial Resource Strain: Unknown  . Difficulty of Paying Living Expenses: Patient refused  Food Insecurity: No Food Insecurity  . Worried About Charity fundraiser in the Last Year: Never true  . Ran Out of Food  in the Last Year: Never true  Transportation Needs: Unknown  . Lack of Transportation (Medical): Patient refused  . Lack of Transportation (Non-Medical): Patient refused  Physical Activity: Unknown  . Days of Exercise per Week: Patient refused  . Minutes of Exercise per Session: Patient refused  Stress: No Stress Concern Present  . Feeling of Stress : Only a little  Social Connections: Unknown  . Frequency of Communication with Friends and Family: More than three times a week  . Frequency of Social Gatherings with Friends and Family: More than three times a week  . Attends Religious Services: More than 4 times per year  . Active Member of Clubs or Organizations: Patient refused  . Attends Archivist Meetings: More than 4 times per year  . Marital Status: Patient refused    Family History  Problem Relation Age of Onset  . Throat cancer Father   . Pneumonia Mother   . Pancreatic cancer Brother      Current Outpatient Medications:  .  ACCU-CHEK GUIDE test strip, USE TO TEST THREE TIMESCDAILY., Disp: ,  Rfl:  .  amLODipine (NORVASC) 5 MG tablet, Take 5 mg by mouth daily., Disp: , Rfl:  .  aspirin EC 81 MG tablet, Take 81 mg by mouth daily., Disp: , Rfl:  .  ferrous sulfate 325 (65 FE) MG tablet, Take 1 tablet (325 mg total) by mouth 2 (two) times daily with a meal., Disp: 60 tablet, Rfl: 0 .  gabapentin (NEURONTIN) 400 MG capsule, Take 400 mg by mouth 3 (three) times daily. , Disp: , Rfl:  .  HM VITAMIN D3 100 MCG (4000 UT) CAPS, TAKE ONE CAPSULE (5000 UNITS TOTAL) BY MOUTH DAILY, Disp: 100 capsule, Rfl: 0 .  hydrochlorothiazide (MICROZIDE) 12.5 MG capsule, Take 12.5 mg by mouth daily., Disp: , Rfl:  .  Insulin Glargine (LANTUS SOLOSTAR) 100 UNIT/ML Solostar Pen, Inject 35 Units into the skin at bedtime., Disp: 15 mL, Rfl: 1 .  Insulin Pen Needle (PEN NEEDLES) 32G X 4 MM MISC, 1 each by Does not apply route at bedtime., Disp: 100 each, Rfl: 3 .  labetalol (NORMODYNE) 100 MG  tablet, Take 100 mg by mouth 2 (two) times daily., Disp: , Rfl:  .  losartan (COZAAR) 100 MG tablet, Take 100 mg by mouth daily., Disp: , Rfl:  .  medroxyPROGESTERone (PROVERA) 10 MG tablet, Take 1 tablet (10 mg total) by mouth See admin instructions. Take 10 mg by mouth daily for the first 10 days of the month, Disp: 30 tablet, Rfl: 4 .  neomycin-polymyxin-hydrocortisone (CORTISPORIN) 3.5-10000-1 OTIC suspension, Place 4 drops into the right ear 3 (three) times daily., Disp: 10 mL, Rfl: 0 .  omeprazole (PRILOSEC) 20 MG capsule, Take 20 mg by mouth daily. , Disp: , Rfl:  .  oxybutynin (DITROPAN-XL) 10 MG 24 hr tablet, Take 10 mg by mouth daily., Disp: , Rfl:  .  simvastatin (ZOCOR) 40 MG tablet, Take 40 mg by mouth at bedtime. , Disp: , Rfl:  .  TRADJENTA 5 MG TABS tablet, Take 5 mg by mouth daily., Disp: , Rfl:  .  vitamin B-12 1000 MCG tablet, Take 1 tablet (1,000 mcg total) by mouth daily., Disp: 30 tablet, Rfl: 0 .  fluticasone (FLONASE) 50 MCG/ACT nasal spray, Place 1 spray into both nostrils daily for 14 days., Disp: 16 g, Rfl: 0  Review of Systems  Review of Systems  Constitutional: Negative for fever, chills, weight loss, malaise/fatigue and diaphoresis.  HENT: Negative for hearing loss, ear pain, nosebleeds, congestion, sore throat, neck pain, tinnitus and ear discharge.   Eyes: Negative for blurred vision, double vision, photophobia, pain, discharge and redness.  Respiratory: Negative for cough, hemoptysis, sputum production, shortness of breath, wheezing and stridor.   Cardiovascular: Negative for chest pain, palpitations, orthopnea, claudication, leg swelling and PND.  Gastrointestinal: negative for abdominal pain. Negative for heartburn, nausea, vomiting, diarrhea, constipation, blood in stool and melena.  Genitourinary: Negative for dysuria, urgency, frequency, hematuria and flank pain.  Musculoskeletal: Negative for myalgias, back pain, joint pain and falls.  Skin: Negative for  itching and rash.  Neurological: Negative for dizziness, tingling, tremors, sensory change, speech change, focal weakness, seizures, loss of consciousness, weakness and headaches.  Endo/Heme/Allergies: Negative for environmental allergies and polydipsia. Does not bruise/bleed easily.  Psychiatric/Behavioral: Negative for depression, suicidal ideas, hallucinations, memory loss and substance abuse. The patient is not nervous/anxious and does not have insomnia.        Objective:  Blood pressure 132/84, pulse 87, height 5\' 2"  (1.575 m), weight 149 lb (67.6 kg).   Physical  Exam  Vitals reviewed. Constitutional: She is oriented to person, place, and time. She appears well-developed and well-nourished.  HENT:  Head: Normocephalic and atraumatic.        Right Ear: External ear normal.  Left Ear: External ear normal.  Nose: Nose normal.  Mouth/Throat: Oropharynx is clear and moist.  Eyes: Conjunctivae and EOM are normal. Pupils are equal, round, and reactive to light. Right eye exhibits no discharge. Left eye exhibits no discharge. No scleral icterus.  Neck: Normal range of motion. Neck supple. No tracheal deviation present. No thyromegaly present.  Cardiovascular: Normal rate, regular rhythm, normal heart sounds and intact distal pulses.  Exam reveals no gallop and no friction rub.   No murmur heard. Respiratory: Effort normal and breath sounds normal. No respiratory distress. She has no wheezes. She has no rales. She exhibits no tenderness.  GI: Soft. Bowel sounds are normal. She exhibits no distension and no mass. There is no tenderness. There is no rebound and no guarding.  Genitourinary:  Breasts no masses skin changes or nipple changes bilaterally      Vulva is normal without lesions Vagina is pink moist without discharge Cervix normal in appearance and pap is not done Uterus is normal size shape and contour Adnexa is negative with normal sized ovaries  {Rectal    hemoccult negative,  normal tone, no masses  Musculoskeletal: Normal range of motion. She exhibits no edema and no tenderness.  Neurological: She is alert and oriented to person, place, and time. She has normal reflexes. She displays normal reflexes. No cranial nerve deficit. She exhibits normal muscle tone. Coordination normal.  Skin: Skin is warm and dry. No rash noted. No erythema. No pallor.  Psychiatric: She has a normal mood and affect. Her behavior is normal. Judgment and thought content normal.       Medications Ordered at today's visit: No orders of the defined types were placed in this encounter.   Other orders placed at today's visit: No orders of the defined types were placed in this encounter.     Assessment:    Normal Gyn exam.   Postmenopausal woman Plan:    Contraception: post menopausal status. Mammogram ordered. Follow up in: 3 years.     Return in about 3 years (around 08/16/2023), or if symptoms worsen or fail to improve, for yearly.

## 2020-08-20 DIAGNOSIS — E1142 Type 2 diabetes mellitus with diabetic polyneuropathy: Secondary | ICD-10-CM | POA: Diagnosis not present

## 2020-08-20 DIAGNOSIS — I10 Essential (primary) hypertension: Secondary | ICD-10-CM | POA: Diagnosis not present

## 2020-08-29 ENCOUNTER — Other Ambulatory Visit: Payer: Self-pay

## 2020-08-29 ENCOUNTER — Encounter: Payer: Self-pay | Admitting: Nurse Practitioner

## 2020-08-29 ENCOUNTER — Ambulatory Visit (INDEPENDENT_AMBULATORY_CARE_PROVIDER_SITE_OTHER): Payer: Medicare Other | Admitting: Nurse Practitioner

## 2020-08-29 VITALS — BP 140/82 | HR 64 | Ht 62.0 in | Wt 151.0 lb

## 2020-08-29 DIAGNOSIS — Z794 Long term (current) use of insulin: Secondary | ICD-10-CM

## 2020-08-29 DIAGNOSIS — I10 Essential (primary) hypertension: Secondary | ICD-10-CM

## 2020-08-29 DIAGNOSIS — E559 Vitamin D deficiency, unspecified: Secondary | ICD-10-CM

## 2020-08-29 DIAGNOSIS — N1832 Chronic kidney disease, stage 3b: Secondary | ICD-10-CM

## 2020-08-29 DIAGNOSIS — E1122 Type 2 diabetes mellitus with diabetic chronic kidney disease: Secondary | ICD-10-CM

## 2020-08-29 DIAGNOSIS — Z7689 Persons encountering health services in other specified circumstances: Secondary | ICD-10-CM | POA: Diagnosis not present

## 2020-08-29 DIAGNOSIS — E782 Mixed hyperlipidemia: Secondary | ICD-10-CM

## 2020-08-29 LAB — POCT GLYCOSYLATED HEMOGLOBIN (HGB A1C): Hemoglobin A1C: 6.5 % — AB (ref 4.0–5.6)

## 2020-08-29 MED ORDER — LANTUS SOLOSTAR 100 UNIT/ML ~~LOC~~ SOPN: 25.0000 [IU] | PEN_INJECTOR | Freq: Every day | SUBCUTANEOUS | 1 refills | Status: DC

## 2020-08-29 NOTE — Progress Notes (Signed)
Endocrinology follow-up  Note       08/29/2020, 10:34 AM   Subjective:    Patient ID: Amy Stein, female    DOB: 12-26-1933.  Amy Stein is being seen in follow-up for management of currently uncontrolled symptomatic type 2 diabetes, hypertension, hyperlipidemia. PMD:   Rosita Fire, MD.   Past Medical History:  Diagnosis Date  . Diabetes mellitus   . Hypertension   . Shingles    on both legs 1/18   Past Surgical History:  Procedure Laterality Date  . COLONOSCOPY    . COLONOSCOPY N/A 02/23/2017   Procedure: COLONOSCOPY;  Surgeon: Daneil Dolin, MD;  Location: [facility];  Service: Endoscopy;  Laterality: N/A;  9:30 Am  . POLYPECTOMY  02/23/2017   Procedure: POLYPECTOMY;  Surgeon: Daneil Dolin, MD;  Location: [facility];  Service: Endoscopy;;  cecum   Social History   Socioeconomic History  . Marital status: Single    Spouse name: Not on file  . Number of children: Not on file  . Years of education: Not on file  . Highest education level: Not on file  Occupational History  . Not on file  Tobacco Use  . Smoking status: Former Research scientist (life sciences)  . Smokeless tobacco: Never Used  Vaping Use  . Vaping Use: Never used  Substance and Sexual Activity  . Alcohol use: No  . Drug use: No  . Sexual activity: Yes    Birth control/protection: Post-menopausal  Other Topics Concern  . Not on file  Social History Narrative  . Not on file   Social Determinants of Health   Financial Resource Strain: Unknown  . Difficulty of Paying Living Expenses: Patient refused  Food Insecurity: No Food Insecurity  . Worried About Charity fundraiser in the Last Year: Never true  . Ran Out of Food in the Last Year: Never true  Transportation Needs: Unknown  . Lack of Transportation (Medical): Patient refused  . Lack of Transportation (Non-Medical): Patient refused  Physical Activity: Unknown  . Days of Exercise  per Week: Patient refused  . Minutes of Exercise per Session: Patient refused  Stress: No Stress Concern Present  . Feeling of Stress : Only a little  Social Connections: Unknown  . Frequency of Communication with Friends and Family: More than three times a week  . Frequency of Social Gatherings with Friends and Family: More than three times a week  . Attends Religious Services: More than 4 times per year  . Active Member of Clubs or Organizations: Patient refused  . Attends Archivist Meetings: More than 4 times per year  . Marital Status: Patient refused   Outpatient Encounter Medications as of 08/29/2020  Medication Sig  . ACCU-CHEK GUIDE test strip USE TO TEST THREE TIMESCDAILY.  Marland Kitchen amLODipine (NORVASC) 5 MG tablet Take 5 mg by mouth daily.  Marland Kitchen aspirin EC 81 MG tablet Take 81 mg by mouth daily.  . ferrous sulfate 325 (65 FE) MG tablet Take 1 tablet (325 mg total) by mouth 2 (two) times daily with a meal.  . fluticasone (FLONASE) 50 MCG/ACT nasal spray Place 1 spray into both nostrils daily  for 14 days.  Marland Kitchen gabapentin (NEURONTIN) 400 MG capsule Take 400 mg by mouth 3 (three) times daily.   Marland Kitchen HM VITAMIN D3 100 MCG (4000 UT) CAPS TAKE ONE CAPSULE (5000 UNITS TOTAL) BY MOUTH DAILY  . hydrochlorothiazide (MICROZIDE) 12.5 MG capsule Take 12.5 mg by mouth daily.  . insulin glargine (LANTUS SOLOSTAR) 100 UNIT/ML Solostar Pen Inject 25 Units into the skin at bedtime.  . Insulin Pen Needle (PEN NEEDLES) 32G X 4 MM MISC 1 each by Does not apply route at bedtime.  Marland Kitchen labetalol (NORMODYNE) 100 MG tablet Take 100 mg by mouth 2 (two) times daily.  Marland Kitchen losartan (COZAAR) 100 MG tablet Take 100 mg by mouth daily.  . medroxyPROGESTERone (PROVERA) 10 MG tablet Take 1 tablet (10 mg total) by mouth See admin instructions. Take 10 mg by mouth daily for the first 10 days of the month  . neomycin-polymyxin-hydrocortisone (CORTISPORIN) 3.5-10000-1 OTIC suspension Place 4 drops into the right ear 3 (three)  times daily.  Marland Kitchen omeprazole (PRILOSEC) 20 MG capsule Take 20 mg by mouth daily.   Marland Kitchen oxybutynin (DITROPAN-XL) 10 MG 24 hr tablet Take 10 mg by mouth daily.  . simvastatin (ZOCOR) 40 MG tablet Take 40 mg by mouth at bedtime.   . TRADJENTA 5 MG TABS tablet Take 5 mg by mouth daily.  . vitamin B-12 1000 MCG tablet Take 1 tablet (1,000 mcg total) by mouth daily.  . [DISCONTINUED] Insulin Glargine (LANTUS SOLOSTAR) 100 UNIT/ML Solostar Pen Inject 35 Units into the skin at bedtime.   No facility-administered encounter medications on file as of 08/29/2020.    ALLERGIES: Allergies  Allergen Reactions  . Sulfa Antibiotics   . Sulfonamide Derivatives Rash    VACCINATION STATUS:  There is no immunization history on file for this patient.  Diabetes She presents for her follow-up diabetic visit. She has type 2 diabetes mellitus. Onset time: She was diagnosed at approximate age of 36 years. Her disease course has been stable. Hypoglycemia symptoms include nervousness/anxiousness, sweats and tremors. Pertinent negatives for hypoglycemia include no confusion, headaches, pallor or seizures. There are no diabetic associated symptoms. Pertinent negatives for diabetes include no chest pain, no polydipsia, no polyphagia and no polyuria. There are no hypoglycemic complications. Symptoms are stable. Diabetic complications include nephropathy. Risk factors for coronary artery disease include diabetes mellitus, hypertension, sedentary lifestyle, tobacco exposure, dyslipidemia and post-menopausal. Current diabetic treatment includes insulin injections and oral agent (monotherapy). She is compliant with treatment all of the time. Her weight is fluctuating minimally. She is following a generally unhealthy diet. When asked about meal planning, she reported none. She has not had a previous visit with a dietitian. She rarely participates in exercise. Her home blood glucose trend is decreasing steadily. Her breakfast blood  glucose range is generally 70-90 mg/dl. (She presents today, accompanied by her aide, with no meter or logs to review.  Her POCT A1c today is 6.5%, essentially unchanged from previous visit.  Her aide reports her glucose ranges between 80-90 fasting with no reports of hypoglycemia.) An ACE inhibitor/angiotensin II receptor blocker is not being taken (was taken off by nephrologist). She does not see a podiatrist.Eye exam is current.  Hypertension This is a chronic problem. The current episode started more than 1 year ago. The problem has been gradually improving since onset. The problem is controlled. Associated symptoms include sweats. Pertinent negatives include no chest pain, headaches, palpitations or shortness of breath. There are no associated agents to hypertension. Risk factors for coronary  artery disease include diabetes mellitus, sedentary lifestyle, smoking/tobacco exposure, post-menopausal state and dyslipidemia. Past treatments include diuretics, calcium channel blockers and beta blockers. The current treatment provides moderate improvement. Compliance problems include exercise.  Hypertensive end-organ damage includes kidney disease. Identifiable causes of hypertension include chronic renal disease.     Review of systems  Constitutional: + Minimally fluctuating body weight,  current Body mass index is 27.62 kg/m. , no fatigue, no subjective hyperthermia, no subjective hypothermia, apparent memory issues Eyes: no blurry vision, no xerophthalmia ENT: no sore throat, no nodules palpated in throat, no dysphagia/odynophagia, no hoarseness Cardiovascular: no chest pain, no shortness of breath, no palpitations, no leg swelling Respiratory: no cough, no shortness of breath Gastrointestinal: no nausea/vomiting/diarrhea Musculoskeletal: no muscle/joint aches, walks with cane (fell in parking lot on the way in) Skin: no rashes, no hyperemia Neurological: no tremors, no numbness, no tingling, no  dizziness Psychiatric: no depression, no anxiety    Objective:    BP 140/82   Pulse 64   Ht 5\' 2"  (1.575 m)   Wt 151 lb (68.5 kg)   BMI 27.62 kg/m   Wt Readings from Last 3 Encounters:  08/29/20 151 lb (68.5 kg)  08/15/20 149 lb (67.6 kg)  05/01/20 153 lb 9.6 oz (69.7 kg)    BP Readings from Last 3 Encounters:  08/29/20 140/82  08/15/20 132/84  05/06/20 (!) 148/78     Physical Exam- Limited  Constitutional:  Body mass index is 27.62 kg/m. , not in acute distress,? Memory impairment Eyes:  EOMI, no exophthalmos Neck: Supple Cardiovascular: RRR, no murmurs, rubs, or gallops, no edema Respiratory: Adequate breathing efforts, no crackles, rales, rhonchi, or wheezing Musculoskeletal: no gross deformities, strength intact in all four extremities, no gross restriction of joint movements, walks with cane Skin:  no rashes, no hyperemia Neurological: no tremor with outstretched hands      CMP     Component Value Date/Time   NA 138 07/31/2020 1232   NA 143 04/24/2020 1044   K 3.4 (L) 07/31/2020 1232   CL 104 07/31/2020 1232   CO2 25 07/31/2020 1232   GLUCOSE 101 (H) 07/31/2020 1232   BUN 29 (H) 07/31/2020 1232   BUN 21 04/24/2020 1044   CREATININE 1.68 (H) 07/31/2020 1232   CREATININE 2.00 (H) 09/18/2019 0848   CALCIUM 9.2 07/31/2020 1232   PROT 7.1 04/24/2020 1044   ALBUMIN 3.9 07/31/2020 1232   ALBUMIN 4.1 04/24/2020 1044   AST 21 04/24/2020 1044   ALT 14 04/24/2020 1044   ALKPHOS 106 04/24/2020 1044   BILITOT 0.2 04/24/2020 1044   GFRNONAA 30 (L) 07/31/2020 1232   GFRNONAA 23 (L) 09/18/2019 0848   GFRAA 42 (L) 04/24/2020 1044   GFRAA 26 (L) 09/18/2019 0848    Diabetic Labs (most recent): Lab Results  Component Value Date   HGBA1C 6.5 (A) 08/29/2020   HGBA1C 6.6 (H) 04/24/2020   HGBA1C 6.9 (A) 12/31/2019      Lab Results  Component Value Date   TSH 1.68 09/18/2019   TSH 1.29 01/31/2018   TSH 0.793 05/26/2017   FREET4 0.9 09/18/2019   FREET4  0.9 01/31/2018   Lipid Panel     Component Value Date/Time   CHOL 129 09/18/2019 0848   TRIG 143 09/18/2019 0848   HDL 39 (L) 09/18/2019 0848   CHOLHDL 3.3 09/18/2019 0848   LDLCALC 67 09/18/2019 0848      Assessment & Plan:   1) Type 2 diabetes mellitus with stage 3  chronic kidney disease, with long-term current use of insulin ([place])  - Aletha Halim Thoennes has currently uncontrolled symptomatic type 2 DM since 67 years of ago.    She presents today, accompanied by her aide, with no meter or logs to review.  Her POCT A1c today is 6.5%, essentially unchanged from previous visit.  Her aide reports her glucose ranges between 80-90 fasting with no reports of hypoglycemia.  - Recent labs reviewed.  -her diabetes is complicated by stage 4 renal insufficiency and Amy Stein remains at a high risk for more acute and chronic complications which include CAD, CVA, CKD, retinopathy, and neuropathy. These are all discussed in detail with the patient.  - Nutritional counseling repeated at each appointment due to patients tendency to fall back in to old habits.  - The patient admits there is a room for improvement in their diet and drink choices. -  Suggestion is made for the patient to avoid simple carbohydrates from their diet including Cakes, Sweet Desserts / Pastries, Ice Cream, Soda (diet and regular), Sweet Tea, Candies, Chips, Cookies, Sweet Pastries, Store Bought Juices, Alcohol in Excess of 1-2 drinks a day, Artificial Sweeteners, Coffee Creamer, and "Sugar-free" Products. This will help patient to have stable blood glucose profile and potentially avoid unintended weight gain.   - I encouraged the patient to switch to unprocessed or minimally processed complex starch and increased protein intake (animal or plant source), fruits, and vegetables.   - Patient is advised to stick to a routine mealtimes to eat 3 meals a day and avoid unnecessary snacks (to snack only to correct hypoglycemia).  - I  have approached her with the following individualized plan to manage diabetes and patient agrees:   -She is elderly patient with several comorbidities,  #1 priority and treating her diabetes would be to avoid hypoglycemia.    -Given her stable A1c of 6.6%, she would not need prandial insulin for now.   -She is advised to lower her dose of Lantus to 25 units SQ nightly.  She can continue her Tradjenta 5 mg po daily.   -She is encouraged to continue monitoring blood glucose at least twice daily, before breakfast and before bed and to call the clinic if blood glucose is below 70 or greater than 200 for 3 tests in a row.  -She is not a candidate for metformin, SGLT2 inhibitors due to CKD.  -- Patient specific target  A1c;  LDL, HDL, Triglycerides, and  Waist Circumference were discussed in detail.  2) BP/HTN:  Her blood pressure is controlled to target for her age.  She is advised to continue Amlodipine 5 mg po daily, (nephrologist stopped Losartan at last visit).  3) Lipids/HPL:    Her most recent lipid panel from 09/18/19 shows controlled LDL of 67 and triglycerides of 143.  She is advised to continue Simvastatin 40 mg po daily at bedtime.  Side effects and precautions discussed with her.  4)  Weight/Diet:  Her Body mass index is 27.62 kg/m.-may benefit from minimal weight loss.  CDE Consult will be initiated , exercise, and detailed carbohydrates information provided.  5) Vitamin D deficiency- -Her most recent vitamin D level was 73 on 09/18/19.  She is advised to continue daily maintenance dose of vitamin D 3 4000 units daily.  6) Chronic Care/Health Maintenance: -she is on Statin medications and is encouraged to initiate and continue to follow up with Ophthalmology, Dentist, Podiatrist at least yearly or according to recommendations, and advised to  stay away from smoking. I have recommended yearly flu vaccine and pneumonia vaccine at least every 5 years; moderate intensity exercise for up to  150 minutes weekly; and  sleep for at least 7 hours a day.  - I advised patient to maintain close follow up with Rosita Fire, MD for primary care needs.    I spent 30 minutes in the care of the patient today including review of labs from [place], Lipids, Thyroid Function, Hematology (current and previous including abstractions from other facilities); face-to-face time discussing  her blood glucose readings/logs, discussing hypoglycemia and hyperglycemia episodes and symptoms, medications doses, her options of short and long term treatment based on the latest standards of care / guidelines;  discussion about incorporating lifestyle medicine;  and documenting the encounter.    Please refer to Patient Instructions for Blood Glucose Monitoring and Insulin/Medications Dosing Guide"  in media tab for additional information. Please  also refer to " Patient Self Inventory" in the Media  tab for reviewed elements of pertinent patient history.  Amy Stein participated in the discussions, expressed understanding, and voiced agreement with the above plans.  All questions were answered to her satisfaction. she is encouraged to contact clinic should she have any questions or concerns prior to her return visit.   Follow up plan: - Return in about 4 months (around 12/30/2020) for Diabetes F/U- A1c and UM in office, No previsit labs, Bring meter and logs.  Rayetta Pigg, [facility] [address] [place], [place] [address] Phone: [phone] Fax: [phone]  08/29/2020, 10:34 AM

## 2020-08-29 NOTE — Patient Instructions (Signed)

## 2020-09-20 DIAGNOSIS — E1142 Type 2 diabetes mellitus with diabetic polyneuropathy: Secondary | ICD-10-CM | POA: Diagnosis not present

## 2020-09-20 DIAGNOSIS — I10 Essential (primary) hypertension: Secondary | ICD-10-CM | POA: Diagnosis not present

## 2020-10-07 ENCOUNTER — Other Ambulatory Visit: Payer: Self-pay

## 2020-10-07 ENCOUNTER — Encounter: Payer: Self-pay | Admitting: Orthopedic Surgery

## 2020-10-07 ENCOUNTER — Ambulatory Visit (INDEPENDENT_AMBULATORY_CARE_PROVIDER_SITE_OTHER): Payer: Medicare Other | Admitting: Orthopedic Surgery

## 2020-10-07 VITALS — BP 147/86 | HR 57 | Ht 62.0 in | Wt 151.0 lb

## 2020-10-07 DIAGNOSIS — M19012 Primary osteoarthritis, left shoulder: Secondary | ICD-10-CM | POA: Diagnosis not present

## 2020-10-07 NOTE — Progress Notes (Signed)
Orthopaedic Clinic Return  Assessment: Amy Stein is a 87 y.o. female with the following: Left shoulder glenohumeral arthritis  Plan: Discussed her radiographs in clinic today.  She has advanced left shoulder glenohumeral arthritis based on her XR from September, 2021.  At the last visit she had a subacromial injection with minimal improvement in her symptoms.  She continues to have decreased mobility and aching pain in her shoulder.  I have recommended a glenohumeral joint injection, to be completed by the radiologist at [facility].  We have placed the order and she will be contacted to schedule the appointment.  We briefly discussed shoulder replacement as a surgical option, but I am not convinced that she would be a good candidate.  Follow up as needed.   Body mass index is 27.62 kg/m.  Follow-up: Return if symptoms worsen or fail to improve.   Subjective:  Chief Complaint  Patient presents with   Shoulder Pain    Lt shoulder pain, doesn't feel like the shot helped.    History of Present Illness: Amy Stein is a 87 y.o. female who returns to clinic today for repeat evaluation of her left shoulder.  She was last seen in clinic in November, at which time she got a steroid injection.  She does not think that it helped.  She continues to take tylenol as needed.  Pain is deep within the shoulder.  It worsens with certain positions.  She is able to get her hand to the top of the head, but this is painful.  No issues with her right hand.  She is right handed.  She presents to clinic with a health aide.   Review of Systems: No fevers or chills No numbness or tingling No chest pain No shortness of breath No bowel or bladder dysfunction No GI distress No headaches   Objective: BP (!) 147/86   Pulse (!) 57   Ht 5\' 2"  (1.575 m)   Wt 151 lb (68.5 kg)   BMI 27.62 kg/m   Physical Exam:  Elderly female.  Alert and oriented, no acute distress.  Uses a cane to assist with  ambulation.  Full and painless ROM of the right shoulder.  Forward flexion of left shoulder limited to 90 degrees.   Difficulty getting hand to top of her head.  Fingers are warm and well perfused.  Intact sensation to her hand.   IMAGING: I personally ordered and reviewed the following images:  XR from September demonstrates advanced arthritis.  Bone on bone articulation with inferior osteophytes.   Mordecai Rasmussen, MD 10/07/2020 10:04 AM

## 2020-10-07 NOTE — Addendum Note (Signed)
Addended by: Elizabeth Sauer on: 10/07/2020 03:43 PM   Modules accepted: Orders

## 2020-10-14 ENCOUNTER — Encounter (HOSPITAL_COMMUNITY): Payer: Self-pay

## 2020-10-14 ENCOUNTER — Ambulatory Visit (HOSPITAL_COMMUNITY)
Admission: RE | Admit: 2020-10-14 | Discharge: 2020-10-14 | Disposition: A | Discharge status: Home / Self Care | Payer: Medicare Other | Source: Ambulatory Visit | Attending: Orthopedic Surgery | Admitting: Orthopedic Surgery

## 2020-10-14 DIAGNOSIS — M19012 Primary osteoarthritis, left shoulder: Secondary | ICD-10-CM | POA: Insufficient documentation

## 2020-10-14 DIAGNOSIS — M25512 Pain in left shoulder: Secondary | ICD-10-CM | POA: Diagnosis not present

## 2020-10-14 MED ORDER — LIDOCAINE HCL (PF) 1 % IJ SOLN
INTRAMUSCULAR | Status: AC
  Administered 2020-10-14: 12:00:00 5 mL
  Filled 2020-10-14: qty 5

## 2020-10-14 MED ORDER — POVIDONE-IODINE 10 % EX SOLN
CUTANEOUS | Status: AC
  Filled 2020-10-14: qty 15

## 2020-10-14 MED ORDER — BUPIVACAINE HCL (PF) 0.5 % IJ SOLN
INTRAMUSCULAR | Status: AC
  Administered 2020-10-14: 12:00:00 4 mg
  Filled 2020-10-14: qty 30

## 2020-10-14 MED ORDER — METHYLPREDNISOLONE ACETATE 40 MG/ML IJ SUSP
INTRAMUSCULAR | Status: AC
  Administered 2020-10-14: 12:00:00 40 mg
  Filled 2020-10-14: qty 1

## 2020-10-14 MED ORDER — IOPAMIDOL (ISOVUE-300) INJECTION 61%
30.0000 mL | Freq: Once | INTRAVENOUS | Status: AC | PRN
  Administered 2020-10-14: 12:00:00 3 mL

## 2020-10-22 DIAGNOSIS — R269 Unspecified abnormalities of gait and mobility: Secondary | ICD-10-CM | POA: Diagnosis not present

## 2020-10-22 DIAGNOSIS — I10 Essential (primary) hypertension: Secondary | ICD-10-CM | POA: Diagnosis not present

## 2020-10-22 DIAGNOSIS — E1142 Type 2 diabetes mellitus with diabetic polyneuropathy: Secondary | ICD-10-CM | POA: Diagnosis not present

## 2020-10-22 DIAGNOSIS — N184 Chronic kidney disease, stage 4 (severe): Secondary | ICD-10-CM | POA: Diagnosis not present

## 2020-11-04 NOTE — Addendum Note (Signed)
Encounter addended by: Kayren Eaves T on: 11/04/2020 11:06 AM  Actions taken: Imaging Exam ended, Charge Capture section accepted

## 2020-11-22 DIAGNOSIS — I10 Essential (primary) hypertension: Secondary | ICD-10-CM | POA: Diagnosis not present

## 2020-11-22 DIAGNOSIS — E1142 Type 2 diabetes mellitus with diabetic polyneuropathy: Secondary | ICD-10-CM | POA: Diagnosis not present

## 2020-12-08 ENCOUNTER — Other Ambulatory Visit (HOSPITAL_COMMUNITY): Payer: Self-pay | Admitting: Internal Medicine

## 2020-12-08 ENCOUNTER — Other Ambulatory Visit: Payer: Self-pay

## 2020-12-08 ENCOUNTER — Ambulatory Visit (HOSPITAL_COMMUNITY)
Admission: RE | Admit: 2020-12-08 | Discharge: 2020-12-08 | Disposition: A | Discharge status: Home / Self Care | Payer: Medicare Other | Source: Ambulatory Visit | Attending: Internal Medicine | Admitting: Internal Medicine

## 2020-12-08 DIAGNOSIS — I10 Essential (primary) hypertension: Secondary | ICD-10-CM | POA: Diagnosis not present

## 2020-12-08 DIAGNOSIS — M25512 Pain in left shoulder: Secondary | ICD-10-CM

## 2020-12-08 DIAGNOSIS — E1142 Type 2 diabetes mellitus with diabetic polyneuropathy: Secondary | ICD-10-CM | POA: Diagnosis not present

## 2020-12-08 DIAGNOSIS — M19012 Primary osteoarthritis, left shoulder: Secondary | ICD-10-CM | POA: Diagnosis not present

## 2021-01-02 ENCOUNTER — Other Ambulatory Visit: Payer: Self-pay

## 2021-01-02 ENCOUNTER — Ambulatory Visit (INDEPENDENT_AMBULATORY_CARE_PROVIDER_SITE_OTHER): Payer: Medicare Other | Admitting: Nurse Practitioner

## 2021-01-02 ENCOUNTER — Encounter: Payer: Self-pay | Admitting: Nurse Practitioner

## 2021-01-02 VITALS — BP 179/76 | HR 74 | Ht 62.0 in | Wt 151.0 lb

## 2021-01-02 DIAGNOSIS — E782 Mixed hyperlipidemia: Secondary | ICD-10-CM

## 2021-01-02 DIAGNOSIS — E559 Vitamin D deficiency, unspecified: Secondary | ICD-10-CM

## 2021-01-02 DIAGNOSIS — I10 Essential (primary) hypertension: Secondary | ICD-10-CM | POA: Diagnosis not present

## 2021-01-02 DIAGNOSIS — N1832 Chronic kidney disease, stage 3b: Secondary | ICD-10-CM | POA: Diagnosis not present

## 2021-01-02 DIAGNOSIS — E1122 Type 2 diabetes mellitus with diabetic chronic kidney disease: Secondary | ICD-10-CM | POA: Diagnosis not present

## 2021-01-02 DIAGNOSIS — Z794 Long term (current) use of insulin: Secondary | ICD-10-CM | POA: Diagnosis not present

## 2021-01-02 LAB — POCT GLYCOSYLATED HEMOGLOBIN (HGB A1C): HbA1c, POC (controlled diabetic range): 6.9 % (ref 0.0–7.0)

## 2021-01-02 NOTE — Patient Instructions (Signed)

## 2021-01-02 NOTE — Progress Notes (Signed)
Endocrinology follow-up  Note       01/02/2021, 10:43 AM   Subjective:    Patient ID: Amy Stein, female    DOB: 05-02-1933.  Amy Stein is being seen in follow-up for management of currently uncontrolled symptomatic type 2 diabetes, hypertension, hyperlipidemia. PMD:   Rosita Fire, MD.   Past Medical History:  Diagnosis Date   Diabetes mellitus    Hypertension    Shingles    on both legs 1/18   Past Surgical History:  Procedure Laterality Date   COLONOSCOPY     COLONOSCOPY N/A 02/23/2017   Procedure: COLONOSCOPY;  Surgeon: Daneil Dolin, MD;  Location: [facility];  Service: Endoscopy;  Laterality: N/A;  9:30 Am   POLYPECTOMY  02/23/2017   Procedure: POLYPECTOMY;  Surgeon: Daneil Dolin, MD;  Location: [facility];  Service: Endoscopy;;  cecum   SHOULDER SURGERY Left    August 2022   Social History   Socioeconomic History   Marital status: Single    Spouse name: Not on file   Number of children: Not on file   Years of education: Not on file   Highest education level: Not on file  Occupational History   Not on file  Tobacco Use   Smoking status: Former   Smokeless tobacco: Never  Vaping Use   Vaping Use: Never used  Substance and Sexual Activity   Alcohol use: No   Drug use: No   Sexual activity: Yes    Birth control/protection: Post-menopausal  Other Topics Concern   Not on file  Social History Narrative   Not on file   Social Determinants of Health   Financial Resource Strain: Unknown   Difficulty of Paying Living Expenses: Patient refused  Food Insecurity: No Food Insecurity   Worried About Charity fundraiser in the Last Year: Never true   Ran Out of Food in the Last Year: Never true  Transportation Needs: Unknown   Lack of Transportation (Medical): Patient refused   Lack of Transportation (Non-Medical): Patient refused  Physical Activity: Unknown   Days of  Exercise per Week: Patient refused   Minutes of Exercise per Session: Patient refused  Stress: No Stress Concern Present   Feeling of Stress : Only a little  Social Connections: Unknown   Frequency of Communication with Friends and Family: More than three times a week   Frequency of Social Gatherings with Friends and Family: More than three times a week   Attends Religious Services: More than 4 times per year   Active Member of Clubs or Organizations: Patient refused   Attends Archivist Meetings: More than 4 times per year   Marital Status: Patient refused   Outpatient Encounter Medications as of 01/02/2021  Medication Sig   ACCU-CHEK GUIDE test strip USE TO TEST THREE TIMESCDAILY.   amLODipine (NORVASC) 5 MG tablet Take 5 mg by mouth daily.   aspirin EC 81 MG tablet Take 81 mg by mouth daily.   ferrous sulfate 325 (65 FE) MG tablet Take 1 tablet (325 mg total) by mouth 2 (two) times daily with a meal.   gabapentin (NEURONTIN) 400 MG capsule  Take 400 mg by mouth 3 (three) times daily.    HM VITAMIN D3 100 MCG (4000 UT) CAPS TAKE ONE CAPSULE (5000 UNITS TOTAL) BY MOUTH DAILY   hydrochlorothiazide (MICROZIDE) 12.5 MG capsule Take 12.5 mg by mouth daily.   hydrocortisone 2.5 % cream Apply topically 2 (two) times daily.   insulin glargine (LANTUS SOLOSTAR) 100 UNIT/ML Solostar Pen Inject 25 Units into the skin at bedtime.   Insulin Pen Needle (PEN NEEDLES) 32G X 4 MM MISC 1 each by Does not apply route at bedtime.   labetalol (NORMODYNE) 100 MG tablet Take 100 mg by mouth 2 (two) times daily.   losartan (COZAAR) 100 MG tablet Take 100 mg by mouth daily.   medroxyPROGESTERone (PROVERA) 10 MG tablet Take 1 tablet (10 mg total) by mouth See admin instructions. Take 10 mg by mouth daily for the first 10 days of the month   neomycin-polymyxin-hydrocortisone (CORTISPORIN) 3.5-10000-1 OTIC suspension Place 4 drops into the right ear 3 (three) times daily.   omeprazole (PRILOSEC) 20 MG  capsule Take 20 mg by mouth daily.    oxybutynin (DITROPAN-XL) 10 MG 24 hr tablet Take 10 mg by mouth daily.   simvastatin (ZOCOR) 40 MG tablet Take 40 mg by mouth at bedtime.    TRADJENTA 5 MG TABS tablet Take 5 mg by mouth daily.   vitamin B-12 1000 MCG tablet Take 1 tablet (1,000 mcg total) by mouth daily.   fluticasone (FLONASE) 50 MCG/ACT nasal spray Place 1 spray into both nostrils daily for 14 days.   No facility-administered encounter medications on file as of 01/02/2021.    ALLERGIES: Allergies  Allergen Reactions   Sulfa Antibiotics    Sulfonamide Derivatives Rash    VACCINATION STATUS:  There is no immunization history on file for this patient.  Diabetes She presents for her follow-up diabetic visit. She has type 2 diabetes mellitus. Onset time: She was diagnosed at approximate age of 87 years. Her disease course has been stable. There are no hypoglycemic associated symptoms. Pertinent negatives for hypoglycemia include no confusion, headaches, pallor or seizures. There are no diabetic associated symptoms. Pertinent negatives for diabetes include no chest pain, no polydipsia, no polyphagia and no polyuria. There are no hypoglycemic complications. Symptoms are stable. Diabetic complications include nephropathy. Risk factors for coronary artery disease include diabetes mellitus, hypertension, sedentary lifestyle, tobacco exposure, dyslipidemia and post-menopausal. Current diabetic treatment includes oral agent (monotherapy) and insulin injections. She is compliant with treatment all of the time. Her weight is stable. She is following a generally unhealthy diet. When asked about meal planning, she reported none. She has not had a previous visit with a dietitian. She rarely participates in exercise. Her home blood glucose trend is fluctuating minimally. Her breakfast blood glucose range is generally 130-140 mg/dl. (She presents today, accompanied by her aide, with her logs, no meter  showing stable fasting glycemic profile.  Her POCT A1c today is 6.9%, slightly up from last visit but still an acceptable target for her.  She denies any s/s of hypoglycemia.) An ACE inhibitor/angiotensin II receptor blocker is not being taken (was taken off by nephrologist). She does not see a podiatrist.Eye exam is current.  Hypertension This is a chronic problem. The current episode started more than 1 year ago. The problem has been gradually improving since onset. The problem is controlled. Pertinent negatives include no chest pain, headaches, palpitations or shortness of breath. There are no associated agents to hypertension. Risk factors for coronary artery disease include  diabetes mellitus, sedentary lifestyle, smoking/tobacco exposure, post-menopausal state and dyslipidemia. Past treatments include diuretics, calcium channel blockers and beta blockers. The current treatment provides moderate improvement. Compliance problems include exercise.  Hypertensive end-organ damage includes kidney disease. Identifiable causes of hypertension include chronic renal disease.    Review of systems  Constitutional: + Minimally fluctuating body weight,  current Body mass index is 27.62 kg/m. , no fatigue, no subjective hyperthermia, no subjective hypothermia, apparent memory issues Eyes: no blurry vision, no xerophthalmia ENT: no sore throat, no nodules palpated in throat, no dysphagia/odynophagia, no hoarseness Cardiovascular: no chest pain, no shortness of breath, no palpitations, no leg swelling Respiratory: no cough, no shortness of breath Gastrointestinal: no nausea/vomiting/diarrhea Musculoskeletal: no muscle/joint aches, walks with cane  Skin: no rashes, no hyperemia Neurological: no tremors, no numbness, no tingling, no dizziness Psychiatric: no depression, no anxiety    Objective:    BP (!) 179/76   Pulse 74   Ht 5\' 2"  (1.575 m)   Wt 151 lb (68.5 kg)   BMI 27.62 kg/m   Wt Readings from  Last 3 Encounters:  01/02/21 151 lb (68.5 kg)  10/07/20 151 lb (68.5 kg)  08/29/20 151 lb (68.5 kg)    BP Readings from Last 3 Encounters:  01/02/21 (!) 179/76  10/14/20 133/81  10/07/20 (!) 147/86     Physical Exam- Limited  Constitutional:  Body mass index is 27.62 kg/m. , not in acute distress,? Memory impairment Eyes:  EOMI, no exophthalmos Neck: Supple Cardiovascular: RRR, no murmurs, rubs, or gallops, no edema Respiratory: Adequate breathing efforts, no crackles, rales, rhonchi, or wheezing Musculoskeletal: no gross deformities, strength intact in all four extremities, no gross restriction of joint movements, walks with cane Skin:  no rashes, no hyperemia Neurological: no tremor with outstretched hands      CMP     Component Value Date/Time   NA 138 07/31/2020 1232   NA 143 04/24/2020 1044   K 3.4 (L) 07/31/2020 1232   CL 104 07/31/2020 1232   CO2 25 07/31/2020 1232   GLUCOSE 101 (H) 07/31/2020 1232   BUN 29 (H) 07/31/2020 1232   BUN 21 04/24/2020 1044   CREATININE 1.68 (H) 07/31/2020 1232   CREATININE 2.00 (H) 09/18/2019 0848   CALCIUM 9.2 07/31/2020 1232   PROT 7.1 04/24/2020 1044   ALBUMIN 3.9 07/31/2020 1232   ALBUMIN 4.1 04/24/2020 1044   AST 21 04/24/2020 1044   ALT 14 04/24/2020 1044   ALKPHOS 106 04/24/2020 1044   BILITOT 0.2 04/24/2020 1044   GFRNONAA 30 (L) 07/31/2020 1232   GFRNONAA 23 (L) 09/18/2019 0848   GFRAA 42 (L) 04/24/2020 1044   GFRAA 26 (L) 09/18/2019 0848    Diabetic Labs (most recent): Lab Results  Component Value Date   HGBA1C 6.5 (A) 08/29/2020   HGBA1C 6.6 (H) 04/24/2020   HGBA1C 6.9 (A) 12/31/2019      Lab Results  Component Value Date   TSH 1.68 09/18/2019   TSH 1.29 01/31/2018   TSH 0.793 05/26/2017   FREET4 0.9 09/18/2019   FREET4 0.9 01/31/2018   Lipid Panel     Component Value Date/Time   CHOL 129 09/18/2019 0848   TRIG 143 09/18/2019 0848   HDL 39 (L) 09/18/2019 0848   CHOLHDL 3.3 09/18/2019 0848    LDLCALC 67 09/18/2019 0848      Assessment & Plan:   1) Type 2 diabetes mellitus with stage 3 chronic kidney disease, with long-term current use of insulin (HCC)  -  Amy Stein has currently uncontrolled symptomatic type 2 DM since 88 years of age.    She presents today, accompanied by her aide, with her logs, no meter showing stable fasting glycemic profile.  Her POCT A1c today is 6.9%, slightly up from last visit but still an acceptable target for her.  She denies any s/s of hypoglycemia.  - Recent labs reviewed.  -her diabetes is complicated by stage 4 renal insufficiency and Amy Stein remains at a high risk for more acute and chronic complications which include CAD, CVA, CKD, retinopathy, and neuropathy. These are all discussed in detail with the patient.  - Nutritional counseling repeated at each appointment due to patients tendency to fall back in to old habits.  - The patient admits there is a room for improvement in their diet and drink choices. -  Suggestion is made for the patient to avoid simple carbohydrates from their diet including Cakes, Sweet Desserts / Pastries, Ice Cream, Soda (diet and regular), Sweet Tea, Candies, Chips, Cookies, Sweet Pastries, Store Bought Juices, Alcohol in Excess of 1-2 drinks a day, Artificial Sweeteners, Coffee Creamer, and "Sugar-free" Products. This will help patient to have stable blood glucose profile and potentially avoid unintended weight gain.   - I encouraged the patient to switch to unprocessed or minimally processed complex starch and increased protein intake (animal or plant source), fruits, and vegetables.   - Patient is advised to stick to a routine mealtimes to eat 3 meals a day and avoid unnecessary snacks (to snack only to correct hypoglycemia).  - I have approached her with the following individualized plan to manage diabetes and patient agrees:   -She is elderly patient with several comorbidities,  #1 priority and treating  her diabetes would be to avoid hypoglycemia.    -She is advised to continue her Lantus at 24 units SQ nightly and her Tradjenta 5 mg po daily.   -She is encouraged to continue monitoring blood glucose at least twice daily, before breakfast and before bed and to call the clinic if blood glucose is below 70 or greater than 200 for 3 tests in a row.  -She is not a candidate for metformin, SGLT2 inhibitors due to CKD.  -- Patient specific target  A1c;  LDL, HDL, Triglycerides, and  Waist Circumference were discussed in detail.  2) BP/HTN:  Her blood pressure is not controlled to target.  She is advised to continue Amlodipine 5 mg po daily, HCTZ 12.5 mg po daily, Labetalol 100 mg po twice daily and Losartan 100 mg po daily.  Will defer med changes to nephrologist.  3) Lipids/HPL:    Her most recent lipid panel from 09/18/19 shows controlled LDL of 67 and triglycerides of 143.  She is advised to continue Simvastatin 40 mg po daily at bedtime.  Side effects and precautions discussed with her.  Will recheck lipid panel prior to next visit.  4)  Weight/Diet:  Her Body mass index is 27.62 kg/m.-not a candidate for weight loss.  CDE Consult will be initiated, exercise, and detailed carbohydrates information provided.  5) Vitamin D deficiency- -Her most recent vitamin D level was 73 on 09/18/19.  She is advised to continue daily maintenance dose of vitamin D 3 4000 units daily.  Will recheck vitamin D level prior to next visit.  6) Chronic Care/Health Maintenance: -she is on ARB and Statin medications and is encouraged to initiate and continue to follow up with Ophthalmology, Dentist, Podiatrist at least yearly or according  to recommendations, and advised to  stay away from smoking. I have recommended yearly flu vaccine and pneumonia vaccine at least every 5 years; moderate intensity exercise for up to 150 minutes weekly; and  sleep for at least 7 hours a day.  - I advised patient to maintain close follow  up with Rosita Fire, MD for primary care needs.     I spent 30 minutes in the care of the patient today including review of labs from [place], Lipids, Thyroid Function, Hematology (current and previous including abstractions from other facilities); face-to-face time discussing  her blood glucose readings/logs, discussing hypoglycemia and hyperglycemia episodes and symptoms, medications doses, her options of short and long term treatment based on the latest standards of care / guidelines;  discussion about incorporating lifestyle medicine;  and documenting the encounter.    Please refer to Patient Instructions for Blood Glucose Monitoring and Insulin/Medications Dosing Guide"  in media tab for additional information. Please  also refer to " Patient Self Inventory" in the Media  tab for reviewed elements of pertinent patient history.  Amy Stein participated in the discussions, expressed understanding, and voiced agreement with the above plans.  All questions were answered to her satisfaction. she is encouraged to contact clinic should she have any questions or concerns prior to her return visit.   Follow up plan: - Return in about 6 months (around 07/02/2021) for Diabetes F/U with A1c in office, Previsit labs, Bring meter and logs.    Rayetta Pigg, [facility] [address] [place], [place] [address] Phone: [phone] Fax: [phone]  01/02/2021, 10:43 AM

## 2021-01-02 NOTE — Addendum Note (Signed)
Addended by: Anibal Henderson on: 01/02/2021 10:56 AM   Modules accepted: Orders

## 2021-01-08 DIAGNOSIS — I10 Essential (primary) hypertension: Secondary | ICD-10-CM | POA: Diagnosis not present

## 2021-01-08 DIAGNOSIS — E1142 Type 2 diabetes mellitus with diabetic polyneuropathy: Secondary | ICD-10-CM | POA: Diagnosis not present

## 2021-01-28 DIAGNOSIS — E1142 Type 2 diabetes mellitus with diabetic polyneuropathy: Secondary | ICD-10-CM | POA: Diagnosis not present

## 2021-01-28 DIAGNOSIS — I10 Essential (primary) hypertension: Secondary | ICD-10-CM | POA: Diagnosis not present

## 2021-01-28 DIAGNOSIS — Z23 Encounter for immunization: Secondary | ICD-10-CM | POA: Diagnosis not present

## 2021-01-28 DIAGNOSIS — M25512 Pain in left shoulder: Secondary | ICD-10-CM | POA: Diagnosis not present

## 2021-01-28 DIAGNOSIS — M79661 Pain in right lower leg: Secondary | ICD-10-CM | POA: Diagnosis not present

## 2021-02-03 ENCOUNTER — Other Ambulatory Visit (HOSPITAL_COMMUNITY): Payer: Self-pay | Admitting: Gerontology

## 2021-02-03 DIAGNOSIS — N6310 Unspecified lump in the right breast, unspecified quadrant: Secondary | ICD-10-CM

## 2021-02-10 ENCOUNTER — Telehealth: Payer: Self-pay | Admitting: Orthopedic Surgery

## 2021-02-10 NOTE — Telephone Encounter (Signed)
Call received per voice message - call returned to ph# per message [phone]; unable to leave message. Also tried mobile number on file [phone] - voice mail full; unable to leave message on this number also.

## 2021-02-12 ENCOUNTER — Ambulatory Visit (HOSPITAL_COMMUNITY)
Admission: RE | Admit: 2021-02-12 | Discharge: 2021-02-12 | Disposition: A | Discharge status: Home / Self Care | Payer: Medicare Other | Source: Ambulatory Visit | Attending: Gerontology | Admitting: Gerontology

## 2021-02-12 ENCOUNTER — Other Ambulatory Visit: Payer: Self-pay

## 2021-02-12 DIAGNOSIS — N6314 Unspecified lump in the right breast, lower inner quadrant: Secondary | ICD-10-CM | POA: Diagnosis not present

## 2021-02-12 DIAGNOSIS — N6310 Unspecified lump in the right breast, unspecified quadrant: Secondary | ICD-10-CM

## 2021-02-12 DIAGNOSIS — R922 Inconclusive mammogram: Secondary | ICD-10-CM | POA: Diagnosis not present

## 2021-02-16 ENCOUNTER — Ambulatory Visit: Payer: Medicare Other | Admitting: Adult Health

## 2021-02-28 DIAGNOSIS — E1142 Type 2 diabetes mellitus with diabetic polyneuropathy: Secondary | ICD-10-CM | POA: Diagnosis not present

## 2021-02-28 DIAGNOSIS — I10 Essential (primary) hypertension: Secondary | ICD-10-CM | POA: Diagnosis not present

## 2021-03-30 DIAGNOSIS — I10 Essential (primary) hypertension: Secondary | ICD-10-CM | POA: Diagnosis not present

## 2021-03-30 DIAGNOSIS — E1142 Type 2 diabetes mellitus with diabetic polyneuropathy: Secondary | ICD-10-CM | POA: Diagnosis not present

## 2021-04-22 ENCOUNTER — Encounter: Payer: Self-pay | Admitting: Orthopedic Surgery

## 2021-04-22 ENCOUNTER — Ambulatory Visit (INDEPENDENT_AMBULATORY_CARE_PROVIDER_SITE_OTHER): Payer: 59 | Admitting: Orthopedic Surgery

## 2021-04-22 ENCOUNTER — Other Ambulatory Visit: Payer: Self-pay

## 2021-04-22 DIAGNOSIS — M19012 Primary osteoarthritis, left shoulder: Secondary | ICD-10-CM | POA: Diagnosis not present

## 2021-04-22 NOTE — Progress Notes (Signed)
Orthopaedic Clinic Return  Assessment: Amy Stein is a 88 y.o. female with the following: Left shoulder glenohumeral arthritis  Plan: Severe left glenohumeral arthritis, with decreased function.  Injections have been helpful.  She is not a good surgical candidate.  We will proceed with injection today.  Scrape to left knee, keep covered as needed.  No further intervention needed.  Local wound care only.  Follow-up as needed.  Procedure note injection Left shoulder    Verbal consent was obtained to inject the left shoulder, subacromial space Timeout was completed to confirm the site of injection.  The skin was prepped with alcohol and ethyl chloride was sprayed at the injection site.  A 21-gauge needle was used to inject 40 mg of Depo-Medrol and 1% lidocaine (3 cc) into the subacromial space of the left shoulder using a posterolateral approach.  There were no complications. A sterile bandage was applied.     Follow-up: Return if symptoms worsen or fail to improve.   Subjective:  Chief Complaint  Patient presents with   Shoulder Pain    LT/ hx of surgery w/ Dr. Chester Holstein saw Dr. Aline Brochure for shoulder in 2020   Knee Pain    LT/pt fell in parking lot and injured knee. Pt's knee cleaned and sterile bandage placed. She states knee isn't hurting right now, just scraped    History of Present Illness: Amy Stein is a 88 y.o. female who returns to clinic today for repeat evaluation of her left shoulder.  I have seen her in clinic a couple of occasions.  She is previously had an injection in clinic, as well as a fluoroscopic guided injection.  She has had good responses following each injection.  She continues to have pain and limited mobility in her left shoulder.  She is not a good surgical candidate.  Family was available in clinic today, and also available on the phone.   Review of Systems: No fevers or chills No numbness or tingling No chest pain No shortness of breath No  bowel or bladder dysfunction No GI distress No headaches   Objective: There were no vitals taken for this visit.  Physical Exam:  Elderly female.  Alert and oriented, no acute distress.  Uses a cane to assist with ambulation.  Full and painless ROM of the right shoulder.  Forward flexion of left shoulder limited to 90 degrees.   Difficulty getting hand to top of her head.  Fingers are warm and well perfused.  Intact sensation to her hand.   IMAGING: I personally ordered and reviewed the following images:  XR from September demonstrates advanced arthritis.  Bone on bone articulation with inferior osteophytes.   Mordecai Rasmussen, MD 04/22/2021 12:44 PM

## 2021-04-22 NOTE — Patient Instructions (Signed)

## 2021-04-24 ENCOUNTER — Other Ambulatory Visit (HOSPITAL_COMMUNITY): Payer: Self-pay | Admitting: Gerontology

## 2021-04-24 ENCOUNTER — Other Ambulatory Visit: Payer: Self-pay

## 2021-04-24 ENCOUNTER — Ambulatory Visit (HOSPITAL_COMMUNITY)
Admission: RE | Admit: 2021-04-24 | Discharge: 2021-04-24 | Disposition: A | Discharge status: Home / Self Care | Payer: 59 | Source: Ambulatory Visit | Attending: Gerontology | Admitting: Gerontology

## 2021-04-24 DIAGNOSIS — M25562 Pain in left knee: Secondary | ICD-10-CM | POA: Insufficient documentation

## 2021-06-24 ENCOUNTER — Telehealth: Payer: Self-pay | Admitting: Nurse Practitioner

## 2021-06-24 DIAGNOSIS — E1122 Type 2 diabetes mellitus with diabetic chronic kidney disease: Secondary | ICD-10-CM

## 2021-06-24 NOTE — Telephone Encounter (Signed)
Called daughter and let her know the orders have been placed. Daughter verbalized an understanding. ?

## 2021-06-24 NOTE — Telephone Encounter (Signed)
I just placed an order for referral although many don't require one.

## 2021-06-24 NOTE — Telephone Encounter (Signed)
Patient's daughter, Nemiah Commander called to see if you could put a referral in for a foot doctor so that she can get her toe nails cut - please advise with daughter ?

## 2021-07-01 ENCOUNTER — Other Ambulatory Visit (HOSPITAL_COMMUNITY)
Admission: RE | Admit: 2021-07-01 | Discharge: 2021-07-01 | Disposition: A | Discharge status: Home / Self Care | Payer: 59 | Source: Ambulatory Visit | Attending: Nurse Practitioner | Admitting: Nurse Practitioner

## 2021-07-01 DIAGNOSIS — E782 Mixed hyperlipidemia: Secondary | ICD-10-CM | POA: Insufficient documentation

## 2021-07-01 DIAGNOSIS — N1832 Chronic kidney disease, stage 3b: Secondary | ICD-10-CM | POA: Diagnosis not present

## 2021-07-01 DIAGNOSIS — Z794 Long term (current) use of insulin: Secondary | ICD-10-CM | POA: Diagnosis not present

## 2021-07-01 DIAGNOSIS — E559 Vitamin D deficiency, unspecified: Secondary | ICD-10-CM | POA: Diagnosis not present

## 2021-07-01 DIAGNOSIS — E1122 Type 2 diabetes mellitus with diabetic chronic kidney disease: Secondary | ICD-10-CM | POA: Diagnosis present

## 2021-07-01 LAB — COMPREHENSIVE METABOLIC PANEL
ALT: 20 U/L (ref 0–44)
AST: 25 U/L (ref 15–41)
Albumin: 4 g/dL (ref 3.5–5.0)
Alkaline Phosphatase: 77 U/L (ref 38–126)
Anion gap: 7 (ref 5–15)
BUN: 28 mg/dL — ABNORMAL HIGH (ref 8–23)
CO2: 29 mmol/L (ref 22–32)
Calcium: 9.8 mg/dL (ref 8.9–10.3)
Chloride: 105 mmol/L (ref 98–111)
Creatinine, Ser: 1.45 mg/dL — ABNORMAL HIGH (ref 0.44–1.00)
GFR, Estimated: 35 mL/min — ABNORMAL LOW (ref >60)
Glucose, Bld: 139 mg/dL — ABNORMAL HIGH (ref 70–99)
Potassium: 4 mmol/L (ref 3.5–5.1)
Sodium: 141 mmol/L (ref 135–145)
Total Bilirubin: 0.5 mg/dL (ref 0.3–1.2)
Total Protein: 7.6 g/dL (ref 6.5–8.1)

## 2021-07-01 LAB — LIPID PANEL
Cholesterol: 148 mg/dL (ref 0–200)
HDL: 42 mg/dL (ref >40)
LDL Cholesterol: 86 mg/dL (ref 0–99)
Total CHOL/HDL Ratio: 3.5 RATIO
Triglycerides: 99 mg/dL (ref <150)
VLDL: 20 mg/dL (ref 0–40)

## 2021-07-01 LAB — TSH: TSH: 0.936 u[IU]/mL (ref 0.350–4.500)

## 2021-07-01 LAB — T4, FREE: Free T4: 0.75 ng/dL (ref 0.61–1.12)

## 2021-07-01 LAB — VITAMIN D 25 HYDROXY (VIT D DEFICIENCY, FRACTURES): Vit D, 25-Hydroxy: 103.21 ng/mL — ABNORMAL HIGH (ref 30–100)

## 2021-07-02 ENCOUNTER — Ambulatory Visit: Payer: Medicaid Other | Admitting: Nurse Practitioner

## 2021-07-02 NOTE — Patient Instructions (Incomplete)

## 2021-07-08 ENCOUNTER — Encounter: Payer: Self-pay | Admitting: Nurse Practitioner

## 2021-07-08 ENCOUNTER — Encounter: Payer: 59 | Admitting: Nurse Practitioner

## 2021-07-08 VITALS — BP 126/68 | HR 75 | Ht 62.0 in | Wt 139.6 lb

## 2021-07-08 NOTE — Patient Instructions (Signed)

## 2021-07-09 NOTE — Progress Notes (Signed)
Erroneous encounter

## 2021-07-13 ENCOUNTER — Ambulatory Visit (INDEPENDENT_AMBULATORY_CARE_PROVIDER_SITE_OTHER): Payer: Self-pay | Admitting: Podiatry

## 2021-07-13 DIAGNOSIS — Z91199 Patient's noncompliance with other medical treatment and regimen due to unspecified reason: Secondary | ICD-10-CM

## 2021-07-16 ENCOUNTER — Ambulatory Visit: Payer: 59 | Admitting: Nurse Practitioner

## 2021-07-17 NOTE — Progress Notes (Signed)
   Complete physical exam  Patient: Amy Stein   DOB: 01/30/1997   88 y.o. Female  MRN: [id number]  Subjective:    No chief complaint on file.   Amy Stein is a 88 y.o. female who presents today for a complete physical exam. She reports consuming a {diet types:17450} diet. {types:19826} She generally feels {DESC; WELL/FAIRLY WELL/POORLY:18703}. She reports sleeping {DESC; WELL/FAIRLY WELL/POORLY:18703}. She {does/does not:200015} have additional problems to discuss today.    Most recent fall risk assessment:    10/07/2021   10:42 AM  Fall Risk   Falls in the past year? 0  Number falls in past yr: 0  Injury with Fall? 0  Risk for fall due to : No Fall Risks  Follow up Falls evaluation completed     Most recent depression screenings:    10/07/2021   10:42 AM 08/28/2020   10:46 AM  PHQ 2/9 Scores  PHQ - 2 Score 0 0  PHQ- 9 Score 5     {VISON DENTAL STD PSA (Optional):27386}  {History (Optional):23778}  Patient Care Team: Jessup, Joy, NP as PCP - General (Nurse Practitioner)   Outpatient Medications Prior to Visit  Medication Sig   fluticasone (FLONASE) 50 MCG/ACT nasal spray Place 2 sprays into both nostrils in the morning and at bedtime. After 7 days, reduce to once daily.   norgestimate-ethinyl estradiol (SPRINTEC 28) 0.25-35 MG-MCG tablet Take 1 tablet by mouth daily.   Nystatin POWD Apply liberally to affected area 2 times per day   spironolactone (ALDACTONE) 100 MG tablet Take 1 tablet (100 mg total) by mouth daily.   No facility-administered medications prior to visit.    ROS        Objective:     There were no vitals taken for this visit. {Vitals History (Optional):23777}  Physical Exam   No results found for any visits on 11/12/21. {Show previous labs (optional):23779}    Assessment & Plan:    Routine Health Maintenance and Physical Exam  Immunization History  Administered Date(s) Administered   DTaP 04/15/1999, 06/11/1999,  08/20/1999, 05/05/2000, 11/19/2003   Hepatitis A 09/15/2007, 09/20/2008   Hepatitis B 01/31/1999, 03/10/1999, 08/20/1999   HiB (PRP-OMP) 04/15/1999, 06/11/1999, 08/20/1999, 05/05/2000   IPV 04/15/1999, 06/11/1999, 02/08/2000, 11/19/2003   Influenza,inj,Quad PF,6+ Mos 12/21/2013   Influenza-Unspecified 03/22/2012   MMR 02/07/2001, 11/19/2003   Meningococcal Polysaccharide 09/20/2011   Pneumococcal Conjugate-13 05/05/2000   Pneumococcal-Unspecified 08/20/1999, 11/03/1999   Tdap 09/20/2011   Varicella 02/08/2000, 09/15/2007    Health Maintenance  Topic Date Due   HIV Screening  Never done   Hepatitis C Screening  Never done   INFLUENZA VACCINE  11/10/2021   PAP-Cervical Cytology Screening  11/12/2021 (Originally 01/30/2020)   PAP SMEAR-Modifier  11/12/2021 (Originally 01/30/2020)   TETANUS/TDAP  11/12/2021 (Originally 09/19/2021)   HPV VACCINES  Discontinued   COVID-19 Vaccine  Discontinued    Discussed health benefits of physical activity, and encouraged her to engage in regular exercise appropriate for her age and condition.  Problem List Items Addressed This Visit   None Visit Diagnoses     Annual physical exam    -  Primary   Cervical cancer screening       Need for Tdap vaccination          No follow-ups on file.     Joy Jessup, NP   

## 2021-07-22 ENCOUNTER — Ambulatory Visit: Payer: 59 | Admitting: Podiatry

## 2021-07-22 NOTE — Patient Instructions (Incomplete)

## 2021-07-23 ENCOUNTER — Ambulatory Visit: Payer: 59 | Admitting: Nurse Practitioner

## 2021-07-23 DIAGNOSIS — I10 Essential (primary) hypertension: Secondary | ICD-10-CM

## 2021-07-23 DIAGNOSIS — E559 Vitamin D deficiency, unspecified: Secondary | ICD-10-CM

## 2021-07-23 DIAGNOSIS — Z794 Long term (current) use of insulin: Secondary | ICD-10-CM

## 2021-07-23 DIAGNOSIS — E782 Mixed hyperlipidemia: Secondary | ICD-10-CM

## 2021-07-25 ENCOUNTER — Encounter (HOSPITAL_COMMUNITY): Payer: Self-pay | Admitting: Emergency Medicine

## 2021-07-25 ENCOUNTER — Emergency Department (HOSPITAL_COMMUNITY)
Admission: EM | Admit: 2021-07-25 | Discharge: 2021-07-25 | Disposition: A | Discharge status: Home / Self Care | Payer: 59 | Attending: Emergency Medicine | Admitting: Emergency Medicine

## 2021-07-25 ENCOUNTER — Emergency Department (HOSPITAL_COMMUNITY): Payer: 59

## 2021-07-25 ENCOUNTER — Other Ambulatory Visit: Payer: Self-pay

## 2021-07-25 DIAGNOSIS — Z794 Long term (current) use of insulin: Secondary | ICD-10-CM | POA: Insufficient documentation

## 2021-07-25 DIAGNOSIS — Z7982 Long term (current) use of aspirin: Secondary | ICD-10-CM | POA: Diagnosis not present

## 2021-07-25 DIAGNOSIS — I10 Essential (primary) hypertension: Secondary | ICD-10-CM | POA: Insufficient documentation

## 2021-07-25 DIAGNOSIS — R0789 Other chest pain: Secondary | ICD-10-CM | POA: Insufficient documentation

## 2021-07-25 DIAGNOSIS — R079 Chest pain, unspecified: Secondary | ICD-10-CM | POA: Diagnosis present

## 2021-07-25 DIAGNOSIS — Z79899 Other long term (current) drug therapy: Secondary | ICD-10-CM | POA: Insufficient documentation

## 2021-07-25 DIAGNOSIS — E119 Type 2 diabetes mellitus without complications: Secondary | ICD-10-CM | POA: Insufficient documentation

## 2021-07-25 LAB — CBC
HCT: 39.1 % (ref 36.0–46.0)
Hemoglobin: 12.3 g/dL (ref 12.0–15.0)
MCH: 28.3 pg (ref 26.0–34.0)
MCHC: 31.5 g/dL (ref 30.0–36.0)
MCV: 90.1 fL (ref 80.0–100.0)
Platelets: 155 10*3/uL (ref 150–400)
RBC: 4.34 MIL/uL (ref 3.87–5.11)
RDW: 13.9 % (ref 11.5–15.5)
WBC: 5.4 10*3/uL (ref 4.0–10.5)
nRBC: 0 % (ref 0.0–0.2)

## 2021-07-25 LAB — BASIC METABOLIC PANEL
Anion gap: 8 (ref 5–15)
BUN: 33 mg/dL — ABNORMAL HIGH (ref 8–23)
CO2: 27 mmol/L (ref 22–32)
Calcium: 9.6 mg/dL (ref 8.9–10.3)
Chloride: 104 mmol/L (ref 98–111)
Creatinine, Ser: 1.51 mg/dL — ABNORMAL HIGH (ref 0.44–1.00)
GFR, Estimated: 34 mL/min — ABNORMAL LOW (ref >60)
Glucose, Bld: 186 mg/dL — ABNORMAL HIGH (ref 70–99)
Potassium: 3.8 mmol/L (ref 3.5–5.1)
Sodium: 139 mmol/L (ref 135–145)

## 2021-07-25 LAB — TROPONIN I (HIGH SENSITIVITY)
Troponin I (High Sensitivity): 8 ng/L (ref <18)
Troponin I (High Sensitivity): 7 ng/L (ref <18)

## 2021-07-25 MED ORDER — ACETAMINOPHEN 500 MG PO TABS
1000.0000 mg | ORAL_TABLET | Freq: Once | ORAL | Status: AC
  Administered 2021-07-25: 1000 mg via ORAL
  Filled 2021-07-25: qty 2

## 2021-07-25 MED ORDER — SODIUM CHLORIDE 0.9 % IV BOLUS
500.0000 mL | Freq: Once | INTRAVENOUS | Status: AC
  Administered 2021-07-25: 500 mL via INTRAVENOUS

## 2021-07-25 NOTE — ED Triage Notes (Signed)
Formatting of this note might be different from the original.  Pt arrived via [facility] c/o chest pain x 1 day intermittently. Denies any chest pain today. Denies SOB  Electronically signed by Ramond Craver, RN at 07/25/2021 10:26 AM EDT

## 2021-07-25 NOTE — ED Provider Notes (Signed)
Formatting of this note is different from the original.  Images from the original note were not included.    [facility] EMERGENCY DEPARTMENT  Provider Note    CSN: [id number]  Arrival date & time: 07/25/21  1021        History    Chief Complaint   Patient presents with    Chest Pain     Sharon Roman is a 88 y.o. female.    Pt is a 88 yo female with a pmhx significant for htn and dm.  Pt has had intermittent cp for the past day.  It is hard for her to describe the pain.  She said it "hurt."  Pt denies any other associated sx.  She denies any pain now.        Home Medications  Prior to Admission medications    Medication Sig Start Date End Date Taking? Authorizing Provider   ACCU-CHEK GUIDE test strip USE TO TEST THREE TIMESCDAILY. 12/25/19   [provider]   Accu-Chek Softclix Lancets lancets daily. 03/18/21   [provider]   amLODipine (NORVASC) 5 MG tablet Take 5 mg by mouth daily. 12/25/19   [provider]   aspirin EC 81 MG tablet Take 81 mg by mouth daily.    [provider]   Blood Glucose Monitoring Suppl (ACCU-CHEK GUIDE) w/Device KIT  03/18/21   [provider]   ferrous sulfate 325 (65 FE) MG tablet Take 1 tablet (325 mg total) by mouth 2 (two) times daily with a meal. 05/27/17   Dhungel, Nishant, MD   fluticasone (FLONASE) 50 MCG/ACT nasal spray Place 1 spray into both nostrils daily for 14 days. 05/06/20 04/22/21  Avegno, Darrelyn Hillock, FNP   gabapentin (NEURONTIN) 400 MG capsule Take 400 mg by mouth 3 (three) times daily.     [provider]   HM VITAMIN D3 100 MCG (4000 UT) CAPS TAKE ONE CAPSULE (5000 UNITS TOTAL) BY MOUTH DAILY 05/09/18   Nida, Marella Chimes, MD   hydrochlorothiazide (MICROZIDE) 12.5 MG capsule Take 12.5 mg by mouth daily.    [provider]   hydrocortisone 2.5 % cream Apply topically 2 (two) times daily. 10/17/20   [provider]   insulin glargine (LANTUS SOLOSTAR) 100 UNIT/ML Solostar Pen Inject 25 Units into the skin  at bedtime. 08/29/20   Brita Romp, NP   Insulin Pen Needle (PEN NEEDLES) 32G X 4 MM MISC 1 each by Does not apply route at bedtime. 12/06/17   Cassandria Anger, MD   labetalol (NORMODYNE) 100 MG tablet Take 100 mg by mouth 2 (two) times daily.    [provider]   losartan (COZAAR) 100 MG tablet Take 100 mg by mouth daily.    [provider]   medroxyPROGESTERone (PROVERA) 10 MG tablet Take 1 tablet (10 mg total) by mouth See admin instructions. Take 10 mg by mouth daily for the first 10 days of the month 01/16/18   Estill Dooms, NP   neomycin-polymyxin-hydrocortisone (CORTISPORIN) 3.5-10000-1 OTIC suspension Place 4 drops into the right ear 3 (three) times daily. 05/06/20   Avegno, Darrelyn Hillock, FNP   omeprazole (PRILOSEC) 20 MG capsule Take 20 mg by mouth daily.     [provider]   oxybutynin (DITROPAN-XL) 10 MG 24 hr tablet Take 10 mg by mouth daily. 12/25/19   [provider]   simvastatin (ZOCOR) 40 MG tablet Take 40 mg by mouth at bedtime.  [provider]   TRADJENTA 5 MG TABS tablet Take 5 mg by mouth daily. 12/25/19   [provider]   vitamin B-12 1000 MCG tablet Take 1 tablet (1,000 mcg total) by mouth daily. 05/28/17   Dhungel, Flonnie Overman, MD       Allergies     Sulfa antibiotics and Sulfonamide derivatives      Review of Systems    Review of Systems   Cardiovascular:  Positive for chest pain.   All other systems reviewed and are negative.    Physical Exam  Updated Vital Signs  BP 123/73   Pulse 61   Temp 98.3 F (36.8 C) (Oral)   Resp 14   Ht _0  (1.575 m)   Wt 63.3 kg   SpO2 97%   BMI 25.53 kg/m   Physical Exam  Vitals and nursing note reviewed.   Constitutional:       Appearance: She is well-developed.   HENT:      Head: Normocephalic and atraumatic.   Eyes:      Extraocular Movements: Extraocular movements intact.      Pupils: Pupils are equal, round, and reactive to light.   Cardiovascular:      Rate and Rhythm: Normal  rate and regular rhythm.      Heart sounds: Normal heart sounds.   Pulmonary:      Effort: Pulmonary effort is normal.      Breath sounds: Normal breath sounds.   Abdominal:      General: Bowel sounds are normal.      Palpations: Abdomen is soft.   Musculoskeletal:         General: Normal range of motion.      Cervical back: Normal range of motion and neck supple.   Skin:     General: Skin is warm.      Capillary Refill: Capillary refill takes less than 2 seconds.   Neurological:      General: No focal deficit present.      Mental Status: She is alert and oriented to person, place, and time.   Psychiatric:         Mood and Affect: Mood normal.         Behavior: Behavior normal.     ED Results / Procedures / Treatments    Labs  (all labs ordered are listed, but only abnormal results are displayed)  Labs Reviewed   BASIC METABOLIC PANEL - Abnormal; Notable for the following components:       Result Value    Glucose, Bld 186 (*)     BUN 33 (*)     Creatinine, Ser 1.51 (*)     GFR, Estimated 34 (*)     All other components within normal limits   CBC   TROPONIN I (HIGH SENSITIVITY)   TROPONIN I (HIGH SENSITIVITY)     EKG  EKG Interpretation    Date/Time:  Saturday July 25 2021 10:38:02 EDT  Ventricular Rate:  71  PR Interval:  133  QRS Duration: 126  QT Interval:  412  QTC Calculation: 448  R Axis:   41  Text Interpretation: Sinus rhythm Right bundle branch block No significant change since last tracing Confirmed by Isla Pence [phone]) on 07/25/2021 10:56:32 AM    Radiology  DG Chest Port 1 View    Result Date: 07/25/2021  CLINICAL DATA:  Chest pain EXAM: PORTABLE CHEST 1 VIEW COMPARISON:  06/01/2017 FINDINGS: Heart size and mediastinal contours are stable.  Aortic atherosclerosis. There is eventration along the left hemidiaphragm which appears stable from previous study. Low lung volumes. No pleural effusion or edema. No airspace densities. IMPRESSION: 1. No acute cardiopulmonary abnormalities. 2. Aortic  atherosclerosis. Electronically Signed   By: Kerby Moors M.D.   On: 07/25/2021 10:56      Procedures  Procedures     Medications Ordered in ED  Medications   acetaminophen (TYLENOL) tablet 1,000 mg (1,000 mg Oral Given 07/25/21 1058)   sodium chloride 0.9 % bolus 500 mL (500 mLs Intravenous Bolus 07/25/21 1142)     ED Course/ Medical Decision Making/ A&P      Medical Decision Making  Amount and/or Complexity of Data Reviewed  Labs: ordered.  Radiology: ordered.    Risk  OTC drugs.    This patient presents to the ED for concern of cp, this involves an extensive number of treatment options, and is a complaint that carries with it a high risk of complications and morbidity.  The differential diagnosis includes cardiac, pulm, gi    Co morbidities that complicate the patient evaluation    Dm and htn    Additional history obtained:    Additional history obtained from epic chart review  External records from outside source obtained and reviewed including son    Lab Tests:    I Ordered, and personally interpreted labs.  The pertinent results include:  cbc is nl; mbp with glucose 186 and Cr 1.51; trop nl    Imaging Studies ordered:    I ordered imaging studies including CXR   I independently visualized and interpreted imaging which showed     IMPRESSION:   1. No acute cardiopulmonary abnormalities.   2. Aortic atherosclerosis.     I agree with the radiologist interpretation    Cardiac Monitoring:    The patient was maintained on a cardiac monitor.  I personally viewed and interpreted the cardiac monitored which showed an underlying rhythm of: nsr    Medicines ordered and prescription drug management:    I have reviewed the patients home medicines and have made adjustments as needed    Test Considered:    Ct chest, but PE unlikely.  Pt is not having cp, she is not hypoxic or tachycardic.    Critical Interventions:    ekg    Problem List / ED Course:    CP:  Pt has a heart score of 3.  2 negative troponins.  She will need to  see cards as she has some risk factors, but I think it can be done as an outpatient.  No evidence for admission.    Reevaluation:    After the interventions noted above, I reevaluated the patient and found that they have :improved    Social Determinants of Health:    Lives at home.  She has a lot of family help.    Dispostion:    After consideration of the diagnostic results and the patients response to treatment, I feel that the patent would benefit from discharge with outpatient f/u.      Final Clinical Impression(s) / ED Diagnoses  Final diagnoses:   Atypical chest pain     Rx / DC Orders  ED Discharge Orders            Ordered     Ambulatory referral to Cardiology         07/25/21 1312  Isla Pence, MD  07/25/21 1316    Electronically signed by Isla Pence, MD at 07/25/2021  1:16 PM EDT

## 2021-07-25 NOTE — ED Triage Notes (Signed)
Pt arrived via [facility] c/o chest pain x 1 day intermittently. Denies any chest pain today. Denies SOB ?

## 2021-07-25 NOTE — ED Provider Notes (Signed)
?[facility] ?Provider Note ? ? ?CSN: [id number] ?Arrival date & time: 07/25/21  1021 ? ?  ? ?History ? ?Chief Complaint  ?Patient presents with  ? Chest Pain  ? ? ?Amy Stein is a 88 y.o. female. ? ?Pt is a 88 yo female with a pmhx significant for htn and dm.  Pt has had intermittent cp for the past day.  It is hard for her to describe the pain.  She said it "hurt."  Pt denies any other associated sx.  She denies any pain now. ? ? ?  ? ?Home Medications ?Prior to Admission medications   ?Medication Sig Start Date End Date Taking? Authorizing Provider  ?ACCU-CHEK GUIDE test strip USE TO TEST THREE TIMESCDAILY. 12/25/19   [provider]  ?Accu-Chek Softclix Lancets lancets daily. 03/18/21   [provider]  ?amLODipine (NORVASC) 5 MG tablet Take 5 mg by mouth daily. 12/25/19   [provider]  ?aspirin EC 81 MG tablet Take 81 mg by mouth daily.    [provider]  ?Blood Glucose Monitoring Suppl (ACCU-CHEK GUIDE) w/Device KIT  03/18/21   [provider]  ?ferrous sulfate 325 (65 FE) MG tablet Take 1 tablet (325 mg total) by mouth 2 (two) times daily with a meal. 05/27/17   Dhungel, Nishant, MD  ?fluticasone (FLONASE) 50 MCG/ACT nasal spray Place 1 spray into both nostrils daily for 14 days. 05/06/20 04/22/21  Avegno, Darrelyn Hillock, FNP  ?gabapentin (NEURONTIN) 400 MG capsule Take 400 mg by mouth 3 (three) times daily.     [provider]  ?HM VITAMIN D3 100 MCG (4000 UT) CAPS TAKE ONE CAPSULE (5000 UNITS TOTAL) BY MOUTH DAILY 05/09/18   Cassandria Anger, MD  ?hydrochlorothiazide (MICROZIDE) 12.5 MG capsule Take 12.5 mg by mouth daily.    [provider]  ?hydrocortisone 2.5 % cream Apply topically 2 (two) times daily. 10/17/20   [provider]  ?insulin glargine (LANTUS SOLOSTAR) 100 UNIT/ML Solostar Pen Inject 25 Units into the skin at bedtime. 08/29/20   Brita Romp, NP  ?Insulin Pen Needle (PEN NEEDLES) 32G X 4 MM MISC 1  each by Does not apply route at bedtime. 12/06/17   Cassandria Anger, MD  ?labetalol (NORMODYNE) 100 MG tablet Take 100 mg by mouth 2 (two) times daily.    [provider]  ?losartan (COZAAR) 100 MG tablet Take 100 mg by mouth daily.    [provider]  ?medroxyPROGESTERone (PROVERA) 10 MG tablet Take 1 tablet (10 mg total) by mouth See admin instructions. Take 10 mg by mouth daily for the first 10 days of the month 01/16/18   Estill Dooms, NP  ?neomycin-polymyxin-hydrocortisone (CORTISPORIN) 3.5-10000-1 OTIC suspension Place 4 drops into the right ear 3 (three) times daily. 05/06/20   Emerson Monte, FNP  ?omeprazole (PRILOSEC) 20 MG capsule Take 20 mg by mouth daily.     [provider]  ?oxybutynin (DITROPAN-XL) 10 MG 24 hr tablet Take 10 mg by mouth daily. 12/25/19   [provider]  ?simvastatin (ZOCOR) 40 MG tablet Take 40 mg by mouth at bedtime.     [provider]  ?TRADJENTA 5 MG TABS tablet Take 5 mg by mouth daily. 12/25/19   [provider]  ?vitamin B-12 1000 MCG tablet Take 1 tablet (1,000 mcg total) by mouth daily. 05/28/17   Louellen Molder, MD  ?   ? ?Allergies    ?Sulfa antibiotics and Sulfonamide derivatives   ? ?  Review of Systems   ?Review of Systems  ?Cardiovascular:  Positive for chest pain.  ?All other systems reviewed and are negative. ? ?Physical Exam ?Updated Vital Signs ?BP 123/73   Pulse 61   Temp 98.3 ?F (36.8 ?C) (Oral)   Resp 14   Ht _0  (1.575 m)   Wt 63.3 kg   SpO2 97%   BMI 25.53 kg/m?  ?Physical Exam ?Vitals and nursing note reviewed.  ?Constitutional:   ?   Appearance: She is well-developed.  ?HENT:  ?   Head: Normocephalic and atraumatic.  ?Eyes:  ?   Extraocular Movements: Extraocular movements intact.  ?   Pupils: Pupils are equal, round, and reactive to light.  ?Cardiovascular:  ?   Rate and Rhythm: Normal rate and regular rhythm.  ?   Heart sounds: Normal heart sounds.  ?Pulmonary:  ?   Effort:  Pulmonary effort is normal.  ?   Breath sounds: Normal breath sounds.  ?Abdominal:  ?   General: Bowel sounds are normal.  ?   Palpations: Abdomen is soft.  ?Musculoskeletal:     ?   General: Normal range of motion.  ?   Cervical back: Normal range of motion and neck supple.  ?Skin: ?   General: Skin is warm.  ?   Capillary Refill: Capillary refill takes less than 2 seconds.  ?Neurological:  ?   General: No focal deficit present.  ?   Mental Status: She is alert and oriented to person, place, and time.  ?Psychiatric:     ?   Mood and Affect: Mood normal.     ?   Behavior: Behavior normal.  ? ? ?ED Results / Procedures / Treatments   ?Labs ?(all labs ordered are listed, but only abnormal results are displayed) ?Labs Reviewed  ?BASIC METABOLIC PANEL - Abnormal; Notable for the following components:  ?    Result Value  ? Glucose, Bld 186 (*)   ? BUN 33 (*)   ? Creatinine, Ser 1.51 (*)   ? GFR, Estimated 34 (*)   ? All other components within normal limits  ?CBC  ?TROPONIN I (HIGH SENSITIVITY)  ?TROPONIN I (HIGH SENSITIVITY)  ? ? ?EKG ?EKG Interpretation ? ?Date/Time:  Saturday July 25 2021 10:38:02 EDT ?Ventricular Rate:  71 ?PR Interval:  133 ?QRS Duration: 126 ?QT Interval:  412 ?QTC Calculation: 448 ?R Axis:   41 ?Text Interpretation: Sinus rhythm Right bundle branch block No significant change since last tracing Confirmed by Isla Pence [phone]) on 07/25/2021 10:56:32 AM ? ?Radiology ?DG Chest Port 1 View ? ?Result Date: 07/25/2021 ?CLINICAL DATA:  Chest pain EXAM: PORTABLE CHEST 1 VIEW COMPARISON:  06/01/2017 FINDINGS: Heart size and mediastinal contours are stable. Aortic atherosclerosis. There is eventration along the left hemidiaphragm which appears stable from previous study. Low lung volumes. No pleural effusion or edema. No airspace densities. IMPRESSION: 1. No acute cardiopulmonary abnormalities. 2. Aortic atherosclerosis. Electronically Signed   By: Kerby Moors M.D.   On: 07/25/2021 10:56    ? ?Procedures ?Procedures  ? ? ?Medications Ordered in ED ?Medications  ?acetaminophen (TYLENOL) tablet 1,000 mg (1,000 mg Oral Given 07/25/21 1058)  ?sodium chloride 0.9 % bolus 500 mL (500 mLs Intravenous Bolus 07/25/21 1142)  ? ? ?ED Course/ Medical Decision Making/ A&P ?  ?                        ?Medical Decision Making ?Amount and/or Complexity of Data Reviewed ?  Labs: ordered. ?Radiology: ordered. ? ?Risk ?OTC drugs. ? ? ?This patient presents to the ED for concern of cp, this involves an extensive number of treatment options, and is a complaint that carries with it a high risk of complications and morbidity.  The differential diagnosis includes cardiac, pulm, gi ? ? ?Co morbidities that complicate the patient evaluation ? ?Dm and htn ? ? ?Additional history obtained: ? ?Additional history obtained from epic chart review ?External records from outside source obtained and reviewed including son ? ? ?Lab Tests: ? ?I Ordered, and personally interpreted labs.  The pertinent results include:  cbc is nl; mbp with glucose 186 and Cr 1.51; trop nl ? ? ?Imaging Studies ordered: ? ?I ordered imaging studies including CXR  ?I independently visualized and interpreted imaging which showed  ?  ?IMPRESSION:  ?1. No acute cardiopulmonary abnormalities.  ?2. Aortic atherosclerosis.  ? ?I agree with the radiologist interpretation ? ? ?Cardiac Monitoring: ? ?The patient was maintained on a cardiac monitor.  I personally viewed and interpreted the cardiac monitored which showed an underlying rhythm of: nsr ? ? ?Medicines ordered and prescription drug management: ? ? ?I have reviewed the patients home medicines and have made adjustments as needed ? ? ?Test Considered: ? ?Ct chest, but PE unlikely.  Pt is not having cp, she is not hypoxic or tachycardic. ? ? ?Critical Interventions: ? ?ekg ? ? ? ?Problem List / ED Course: ? ?CP:  Pt has a heart score of 3.  2 negative troponins.  She will need to see cards as she has some risk  factors, but I think it can be done as an outpatient.  No evidence for admission. ? ? ?Reevaluation: ? ?After the interventions noted above, I reevaluated the patient and found that they have :improved ? ? ?Social Determinants of He

## 2021-08-12 ENCOUNTER — Ambulatory Visit (INDEPENDENT_AMBULATORY_CARE_PROVIDER_SITE_OTHER): Payer: 59 | Admitting: Nurse Practitioner

## 2021-08-12 ENCOUNTER — Encounter: Payer: Self-pay | Admitting: Nurse Practitioner

## 2021-08-12 VITALS — BP 165/94 | HR 75 | Ht 62.0 in | Wt 131.0 lb

## 2021-08-12 DIAGNOSIS — E559 Vitamin D deficiency, unspecified: Secondary | ICD-10-CM | POA: Diagnosis not present

## 2021-08-12 DIAGNOSIS — Z794 Long term (current) use of insulin: Secondary | ICD-10-CM

## 2021-08-12 DIAGNOSIS — N1832 Chronic kidney disease, stage 3b: Secondary | ICD-10-CM | POA: Diagnosis not present

## 2021-08-12 DIAGNOSIS — E782 Mixed hyperlipidemia: Secondary | ICD-10-CM | POA: Diagnosis not present

## 2021-08-12 DIAGNOSIS — E1122 Type 2 diabetes mellitus with diabetic chronic kidney disease: Secondary | ICD-10-CM

## 2021-08-12 DIAGNOSIS — I10 Essential (primary) hypertension: Secondary | ICD-10-CM | POA: Diagnosis not present

## 2021-08-12 LAB — POCT GLYCOSYLATED HEMOGLOBIN (HGB A1C): Hemoglobin A1C: 7.6 % — AB (ref 4.0–5.6)

## 2021-08-12 MED ORDER — TRADJENTA 5 MG PO TABS: 5.0000 mg | ORAL_TABLET | Freq: Every day | ORAL | 1 refills | Status: DC

## 2021-08-12 MED ORDER — LANTUS SOLOSTAR 100 UNIT/ML ~~LOC~~ SOPN: 5.0000 [IU] | PEN_INJECTOR | Freq: Every day | SUBCUTANEOUS | 0 refills | Status: DC

## 2021-08-12 NOTE — Progress Notes (Signed)
? ?                                                      Endocrinology follow-up  Note  ?     08/12/2021, 11:57 AM ? ? ?Subjective:  ? ? Patient ID: Amy Stein, female    DOB: 1933/05/04.  ?Amy Stein is being seen in follow-up for management of currently uncontrolled symptomatic type 2 diabetes, hypertension, hyperlipidemia. ?PMD:   Carrolyn Meiers, MD. ? ? ?Past Medical History:  ?Diagnosis Date  ? Diabetes mellitus   ? Hypertension   ? Shingles   ? on both legs 1/18  ? ?Past Surgical History:  ?Procedure Laterality Date  ? COLONOSCOPY    ? COLONOSCOPY N/A 02/23/2017  ? Procedure: COLONOSCOPY;  Surgeon: Daneil Dolin, MD;  Location: [facility];  Service: Endoscopy;  Laterality: N/A;  9:30 Am  ? POLYPECTOMY  02/23/2017  ? Procedure: POLYPECTOMY;  Surgeon: Daneil Dolin, MD;  Location: [facility];  Service: Endoscopy;;  cecum  ? SHOULDER SURGERY Left   ? August 2022  ? ?Social History  ? ?Socioeconomic History  ? Marital status: Single  ?  Spouse name: Not on file  ? Number of children: Not on file  ? Years of education: Not on file  ? Highest education level: Not on file  ?Occupational History  ? Not on file  ?Tobacco Use  ? Smoking status: Former  ? Smokeless tobacco: Never  ?Vaping Use  ? Vaping Use: Never used  ?Substance and Sexual Activity  ? Alcohol use: No  ? Drug use: No  ? Sexual activity: Yes  ?  Birth control/protection: Post-menopausal  ?Other Topics Concern  ? Not on file  ?Social History Narrative  ? Not on file  ? ?Social Determinants of Health  ? ?Financial Resource Strain: Unknown  ? Difficulty of Paying Living Expenses: Patient refused  ?Food Insecurity: No Food Insecurity  ? Worried About Charity fundraiser in the Last Year: Never true  ? Ran Out of Food in the Last Year: Never true  ?Transportation Needs: Unknown  ? Lack of Transportation (Medical): Patient refused  ? Lack of Transportation (Non-Medical): Patient refused  ?Physical Activity: Unknown  ? Days  of Exercise per Week: Patient refused  ? Minutes of Exercise per Session: Patient refused  ?Stress: No Stress Concern Present  ? Feeling of Stress : Only a little  ?Social Connections: Unknown  ? Frequency of Communication with Friends and Family: More than three times a week  ? Frequency of Social Gatherings with Friends and Family: More than three times a week  ? Attends Religious Services: More than 4 times per year  ? Active Member of Clubs or Organizations: Patient refused  ? Attends Archivist Meetings: More than 4 times per year  ? Marital Status: Patient refused  ? ?Outpatient Encounter Medications as of 08/12/2021  ?Medication Sig  ? ACCU-CHEK GUIDE test strip USE TO TEST THREE TIMESCDAILY.  ? Accu-Chek Softclix Lancets lancets daily.  ? amLODipine (NORVASC) 5 MG tablet Take 5 mg by mouth daily.  ? Blood Glucose Monitoring Suppl (ACCU-CHEK GUIDE) w/Device KIT   ? ferrous sulfate 325 (65 FE) MG tablet Take 1 tablet (325 mg total) by mouth 2 (two) times daily with a meal.  ?  fluticasone (FLONASE) 50 MCG/ACT nasal spray Place 1 spray into both nostrils daily for 14 days.  ? gabapentin (NEURONTIN) 400 MG capsule Take 400 mg by mouth 3 (three) times daily.   ? HM VITAMIN D3 100 MCG (4000 UT) CAPS TAKE ONE CAPSULE (5000 UNITS TOTAL) BY MOUTH DAILY  ? hydrochlorothiazide (MICROZIDE) 12.5 MG capsule Take 12.5 mg by mouth daily.  ? hydrocortisone 2.5 % cream Apply topically 2 (two) times daily.  ? insulin glargine (LANTUS SOLOSTAR) 100 UNIT/ML Solostar Pen Inject 5 Units into the skin at bedtime.  ? Insulin Pen Needle (PEN NEEDLES) 32G X 4 MM MISC 1 each by Does not apply route at bedtime.  ? labetalol (NORMODYNE) 100 MG tablet Take 100 mg by mouth 2 (two) times daily.  ? losartan (COZAAR) 100 MG tablet Take 100 mg by mouth daily.  ? medroxyPROGESTERone (PROVERA) 10 MG tablet Take 1 tablet (10 mg total) by mouth See admin instructions. Take 10 mg by mouth daily for the first 10 days of the month  ?  neomycin-polymyxin-hydrocortisone (CORTISPORIN) 3.5-10000-1 OTIC suspension Place 4 drops into the right ear 3 (three) times daily.  ? omeprazole (PRILOSEC) 20 MG capsule Take 20 mg by mouth daily.   ? oxybutynin (DITROPAN-XL) 10 MG 24 hr tablet Take 10 mg by mouth daily.  ? simvastatin (ZOCOR) 40 MG tablet Take 40 mg by mouth at bedtime.   ? TRADJENTA 5 MG TABS tablet Take 1 tablet (5 mg total) by mouth daily.  ? vitamin B-12 1000 MCG tablet Take 1 tablet (1,000 mcg total) by mouth daily.  ? [DISCONTINUED] aspirin EC 81 MG tablet Take 81 mg by mouth daily.  ? [DISCONTINUED] insulin glargine (LANTUS SOLOSTAR) 100 UNIT/ML Solostar Pen Inject 25 Units into the skin at bedtime.  ? [DISCONTINUED] TRADJENTA 5 MG TABS tablet Take 5 mg by mouth daily.  ? ?No facility-administered encounter medications on file as of 08/12/2021.  ? ? ?ALLERGIES: ?Allergies  ?Allergen Reactions  ? Sulfa Antibiotics   ? Sulfonamide Derivatives Rash  ? ? ?VACCINATION STATUS: ? ?There is no immunization history on file for this patient. ? ?Diabetes ?She presents for her follow-up diabetic visit. She has type 2 diabetes mellitus. Onset time: She was diagnosed at approximate age of 49 years. Her disease course has been stable. There are no hypoglycemic associated symptoms. Pertinent negatives for hypoglycemia include no confusion, headaches, pallor or seizures. There are no diabetic associated symptoms. Pertinent negatives for diabetes include no chest pain, no polydipsia, no polyphagia and no polyuria. There are no hypoglycemic complications. Symptoms are stable. Diabetic complications include nephropathy. Risk factors for coronary artery disease include diabetes mellitus, hypertension, sedentary lifestyle, tobacco exposure, dyslipidemia and post-menopausal. Current diabetic treatment includes oral agent (monotherapy) and insulin injections. She is compliant with treatment all of the time. Her weight is stable. She is following a generally  unhealthy diet. When asked about meal planning, she reported none. She has not had a previous visit with a dietitian. She rarely participates in exercise. Her home blood glucose trend is fluctuating minimally. Her overall blood glucose range is 140-180 mg/dl. (She presents today, accompanied by her aide, with her logs and meter showing at target glycemic profile overall.  Her POCT A1c today is 7.6%, increasing from last visit of 6.9% but an acceptable target for her given her age and comorbidities.  The patient has only been getting 5 units of Lantus in the evenings (had to call son to verify this as he helps her  with it).  Analysis of her meter shows 7-day average of 171 with only 3 readings (aide says she has multiple meters at home).  She denies any s/s of hypoglycemia.) An ACE inhibitor/angiotensin II receptor blocker is not being taken (was taken off by nephrologist). She does not see a podiatrist.Eye exam is current.  ?Hypertension ?This is a chronic problem. The current episode started more than 1 year ago. The problem has been gradually improving since onset. The problem is controlled. Pertinent negatives include no chest pain, headaches, palpitations or shortness of breath. There are no associated agents to hypertension. Risk factors for coronary artery disease include diabetes mellitus, sedentary lifestyle, smoking/tobacco exposure, post-menopausal state and dyslipidemia. Past treatments include diuretics, calcium channel blockers and beta blockers. The current treatment provides moderate improvement. Compliance problems include exercise.  Hypertensive end-organ damage includes kidney disease. Identifiable causes of hypertension include chronic renal disease.  ? ? ?Review of systems ? ?Constitutional: + Minimally fluctuating body weight,  current Body mass index is 23.96 kg/m?. , no fatigue, no subjective hyperthermia, no subjective hypothermia, apparent memory issues ?Eyes: no blurry vision, no  xerophthalmia ?ENT: no sore throat, no nodules palpated in throat, no dysphagia/odynophagia, no hoarseness ?Cardiovascular: no chest pain, no shortness of breath, no palpitations, no leg swelling ?Respiratory: no cough, no shor

## 2021-08-12 NOTE — Patient Instructions (Signed)
Diabetes Mellitus and Foot Care Foot care is an important part of your health, especially when you have diabetes. Diabetes may cause you to have problems because of poor blood flow (circulation) to your feet and legs, which can cause your skin to: Become thinner and drier. Break more easily. Heal more slowly. Peel and crack. You may also have nerve damage (neuropathy) in your legs and feet, causing decreased feeling in them. This means that you may not notice minor injuries to your feet that could lead to more serious problems. Noticing and addressing any potential problems early is the best way to prevent future foot problems. How to care for your feet Foot hygiene  Wash your feet daily with warm water and mild soap. Do not use hot water. Then, pat your feet and the areas between your toes until they are completely dry. Do not soak your feet as this can dry your skin. Trim your toenails straight across. Do not dig under them or around the cuticle. File the edges of your nails with an emery board or nail file. Apply a moisturizing lotion or petroleum jelly to the skin on your feet and to dry, brittle toenails. Use lotion that does not contain alcohol and is unscented. Do not apply lotion between your toes. Shoes and socks Wear clean socks or stockings every day. Make sure they are not too tight. Do not wear knee-high stockings since they may decrease blood flow to your legs. Wear shoes that fit properly and have enough cushioning. Always look in your shoes before you put them on to be sure there are no objects inside. To break in new shoes, wear them for just a few hours a day. This prevents injuries on your feet. Wounds, scrapes, corns, and calluses  Check your feet daily for blisters, cuts, bruises, sores, and redness. If you cannot see the bottom of your feet, use a mirror or ask someone for help. Do not cut corns or calluses or try to remove them with medicine. If you find a minor scrape,  cut, or break in the skin on your feet, keep it and the skin around it clean and dry. You may clean these areas with mild soap and water. Do not clean the area with peroxide, alcohol, or iodine. If you have a wound, scrape, corn, or callus on your foot, look at it several times a day to make sure it is healing and not infected. Check for: Redness, swelling, or pain. Fluid or blood. Warmth. Pus or a bad smell. General tips Do not cross your legs. This may decrease blood flow to your feet. Do not use heating pads or hot water bottles on your feet. They may burn your skin. If you have lost feeling in your feet or legs, you may not know this is happening until it is too late. Protect your feet from hot and cold by wearing shoes, such as at the beach or on hot pavement. Schedule a complete foot exam at least once a year (annually) or more often if you have foot problems. Report any cuts, sores, or bruises to your health care provider immediately. Where to find more information American Diabetes Association: www.diabetes.org Association of Diabetes Care & Education Specialists: www.diabeteseducator.org Contact a health care provider if: You have a medical condition that increases your risk of infection and you have any cuts, sores, or bruises on your feet. You have an injury that is not healing. You have redness on your legs or feet. You   feel burning or tingling in your legs or feet. You have pain or cramps in your legs and feet. Your legs or feet are numb. Your feet always feel cold. You have pain around any toenails. Get help right away if: You have a wound, scrape, corn, or callus on your foot and: You have pain, swelling, or redness that gets worse. You have fluid or blood coming from the wound, scrape, corn, or callus. Your wound, scrape, corn, or callus feels warm to the touch. You have pus or a bad smell coming from the wound, scrape, corn, or callus. You have a fever. You have a red  line going up your leg. Summary Check your feet every day for blisters, cuts, bruises, sores, and redness. Apply a moisturizing lotion or petroleum jelly to the skin on your feet and to dry, brittle toenails. Wear shoes that fit properly and have enough cushioning. If you have foot problems, report any cuts, sores, or bruises to your health care provider immediately. Schedule a complete foot exam at least once a year (annually) or more often if you have foot problems. This information is not intended to replace advice given to you by your health care provider. Make sure you discuss any questions you have with your health care provider. Document Revised: 10/18/2019 Document Reviewed: 10/18/2019 Elsevier Patient Education  2023 Elsevier Inc.  

## 2021-08-18 ENCOUNTER — Emergency Department (HOSPITAL_COMMUNITY)
Admission: EM | Admit: 2021-08-18 | Discharge: 2021-08-18 | Disposition: A | Discharge status: Home / Self Care | Payer: 59 | Attending: Emergency Medicine | Admitting: Emergency Medicine

## 2021-08-18 ENCOUNTER — Encounter (HOSPITAL_COMMUNITY): Payer: Self-pay

## 2021-08-18 ENCOUNTER — Emergency Department (HOSPITAL_COMMUNITY): Payer: 59

## 2021-08-18 ENCOUNTER — Other Ambulatory Visit: Payer: Self-pay

## 2021-08-18 DIAGNOSIS — E1122 Type 2 diabetes mellitus with diabetic chronic kidney disease: Secondary | ICD-10-CM | POA: Insufficient documentation

## 2021-08-18 DIAGNOSIS — R519 Headache, unspecified: Secondary | ICD-10-CM | POA: Diagnosis present

## 2021-08-18 DIAGNOSIS — I129 Hypertensive chronic kidney disease with stage 1 through stage 4 chronic kidney disease, or unspecified chronic kidney disease: Secondary | ICD-10-CM | POA: Insufficient documentation

## 2021-08-18 DIAGNOSIS — N189 Chronic kidney disease, unspecified: Secondary | ICD-10-CM | POA: Diagnosis not present

## 2021-08-18 DIAGNOSIS — D631 Anemia in chronic kidney disease: Secondary | ICD-10-CM | POA: Diagnosis not present

## 2021-08-18 LAB — COMPREHENSIVE METABOLIC PANEL
ALT: 14 U/L (ref 0–44)
AST: 20 U/L (ref 15–41)
Albumin: 3.6 g/dL (ref 3.5–5.0)
Alkaline Phosphatase: 78 U/L (ref 38–126)
Anion gap: 8 (ref 5–15)
BUN: 23 mg/dL (ref 8–23)
CO2: 24 mmol/L (ref 22–32)
Calcium: 9.2 mg/dL (ref 8.9–10.3)
Chloride: 105 mmol/L (ref 98–111)
Creatinine, Ser: 1.33 mg/dL — ABNORMAL HIGH (ref 0.44–1.00)
GFR, Estimated: 39 mL/min — ABNORMAL LOW (ref >60)
Glucose, Bld: 107 mg/dL — ABNORMAL HIGH (ref 70–99)
Potassium: 3.9 mmol/L (ref 3.5–5.1)
Sodium: 137 mmol/L (ref 135–145)
Total Bilirubin: 0.6 mg/dL (ref 0.3–1.2)
Total Protein: 6.8 g/dL (ref 6.5–8.1)

## 2021-08-18 LAB — CBC
HCT: 35 % — ABNORMAL LOW (ref 36.0–46.0)
Hemoglobin: 11 g/dL — ABNORMAL LOW (ref 12.0–15.0)
MCH: 28.4 pg (ref 26.0–34.0)
MCHC: 31.4 g/dL (ref 30.0–36.0)
MCV: 90.4 fL (ref 80.0–100.0)
Platelets: 155 10*3/uL (ref 150–400)
RBC: 3.87 MIL/uL (ref 3.87–5.11)
RDW: 13.8 % (ref 11.5–15.5)
WBC: 5.1 10*3/uL (ref 4.0–10.5)
nRBC: 0 % (ref 0.0–0.2)

## 2021-08-18 NOTE — Discharge Instructions (Addendum)
Try over-the-counter Afrin nose spray for future nosebleeds.  This can be applied directly in the nostril or to packing material.  Take Tylenol as needed for headaches as we discussed given your chronic kidney damage.  Follow-up with your primary care provider as needed for future management of headaches.  I will include "red flag" signs for future headaches to look out for.  We discussed these at length during evaluation, but I will provide a written copy for you.  Please do not hesitate if these symptoms become apparent.  ?

## 2021-08-18 NOTE — ED Provider Triage Note (Signed)
Emergency Medicine Provider Triage Evaluation Note ? ?Amy Stein , a 88 y.o. female  was evaluated in triage.  Pt complains of headache. Grandson at bedside. ? ?No nausea or vomiting. Had a nosebleed this AM too.  ? ?Review of Systems  ?Positive: Headache, nosebleed. ?Negative: Fever  ? ?Physical Exam  ?BP 126/85   Pulse 72   Temp 98.4 ?F (36.9 ?C)   Resp 16   Ht '5\' 2"'$  (1.575 m)   Wt 61.2 kg   SpO2 98%   BMI 24.69 kg/m?  ?Gen:   Awake, no distress   ?Resp:  Normal effort  ?MSK:   Moves extremities without difficulty  ?Other:  Smile symmetric, moves all four ext w normal strength  ? ?Medical Decision Making  ?Medically screening exam initiated at 4:00 PM.  Appropriate orders placed.  Amy Stein was informed that the remainder of the evaluation will be completed by another provider, this initial triage assessment does not replace that evaluation, and the importance of remaining in the ED until their evaluation is complete. ? ?CBC from 4/15 nml ? ?CT head ordered ?  ?Tedd Sias, [place] ?08/18/21 1603 ? ?

## 2021-08-18 NOTE — ED Triage Notes (Signed)
Pt arrived POV from home c/o a nose bleed that happened this morning and since then she had had a headache. Pt states she has been having a headache for about a week now.  ?

## 2021-08-18 NOTE — ED Provider Notes (Signed)
?[facility] ?Provider Note ? ? ?CSN: [id number] ?Arrival date & time: 08/18/21  1432 ? ?  ? ?History ? ?Chief Complaint  ?Patient presents with  ? Headache  ? ? ?Amy Stein is a 88 y.o. female. ? ? ?Headache ?Associated symptoms: no abdominal pain, no congestion, no cough and no fatigue   ? ?88 year old female presents emergency department with nosebleed began this morning after waking up and associated headache.  She said she was able to stop the bleeding by placing cotton balls in her nostril.  Her nose is not currently bleeding.  She that her headache began after she noticed her nose bleeding.  At that time she took a Tylenol and immediately came to the emergency department.  Upon arrival, her headache had ceased and her nose is no longer bleeding.  She denies fever, chills, night sweats, chest pain, shortness of breath, abdominal pain, nausea/vomiting/diarrhea, urinary/vaginal symptoms, change in bowel habits.  ? ?Past medical history significant for hypertension, diabetes, ckd, hyperlipidemia, anemia ? ?Home Medications ?Prior to Admission medications   ?Medication Sig Start Date End Date Taking? Authorizing Provider  ?ACCU-CHEK GUIDE test strip USE TO TEST THREE TIMESCDAILY. 12/25/19   [provider]  ?Accu-Chek Softclix Lancets lancets daily. 03/18/21   [provider]  ?amLODipine (NORVASC) 5 MG tablet Take 5 mg by mouth daily. 12/25/19   [provider]  ?Blood Glucose Monitoring Suppl (ACCU-CHEK GUIDE) w/Device KIT  03/18/21   [provider]  ?ferrous sulfate 325 (65 FE) MG tablet Take 1 tablet (325 mg total) by mouth 2 (two) times daily with a meal. 05/27/17   Dhungel, Nishant, MD  ?fluticasone (FLONASE) 50 MCG/ACT nasal spray Place 1 spray into both nostrils daily for 14 days. 05/06/20 04/22/21  Avegno, Darrelyn Hillock, FNP  ?gabapentin (NEURONTIN) 400 MG capsule Take 400 mg by mouth 3 (three) times daily.     [provider]  ?HM  VITAMIN D3 100 MCG (4000 UT) CAPS TAKE ONE CAPSULE (5000 UNITS TOTAL) BY MOUTH DAILY 05/09/18   Cassandria Anger, MD  ?hydrochlorothiazide (MICROZIDE) 12.5 MG capsule Take 12.5 mg by mouth daily.    [provider]  ?hydrocortisone 2.5 % cream Apply topically 2 (two) times daily. 10/17/20   [provider]  ?insulin glargine (LANTUS SOLOSTAR) 100 UNIT/ML Solostar Pen Inject 5 Units into the skin at bedtime. 08/12/21   Brita Romp, NP  ?Insulin Pen Needle (PEN NEEDLES) 32G X 4 MM MISC 1 each by Does not apply route at bedtime. 12/06/17   Cassandria Anger, MD  ?labetalol (NORMODYNE) 100 MG tablet Take 100 mg by mouth 2 (two) times daily.    [provider]  ?losartan (COZAAR) 100 MG tablet Take 100 mg by mouth daily.    [provider]  ?medroxyPROGESTERone (PROVERA) 10 MG tablet Take 1 tablet (10 mg total) by mouth See admin instructions. Take 10 mg by mouth daily for the first 10 days of the month 01/16/18   Estill Dooms, NP  ?neomycin-polymyxin-hydrocortisone (CORTISPORIN) 3.5-10000-1 OTIC suspension Place 4 drops into the right ear 3 (three) times daily. 05/06/20   Emerson Monte, FNP  ?omeprazole (PRILOSEC) 20 MG capsule Take 20 mg by mouth daily.     [provider]  ?oxybutynin (DITROPAN-XL) 10 MG 24 hr tablet Take 10 mg by mouth daily. 12/25/19   [provider]  ?simvastatin (ZOCOR) 40 MG tablet Take 40 mg by mouth at bedtime.  [provider]  ?TRADJENTA 5 MG TABS tablet Take 1 tablet (5 mg total) by mouth daily. 08/12/21   Brita Romp, NP  ?vitamin B-12 1000 MCG tablet Take 1 tablet (1,000 mcg total) by mouth daily. 05/28/17   Louellen Molder, MD  ?   ? ?Allergies    ?Sulfa antibiotics and Sulfonamide derivatives   ? ?Review of Systems   ?Review of Systems  ?Constitutional:  Negative for chills and fatigue.  ?HENT:  Positive for nosebleeds. Negative for congestion.   ?Eyes:  Negative for discharge.  ?Respiratory:   Negative for cough, shortness of breath and wheezing.   ?Cardiovascular:  Negative for chest pain and palpitations.  ?Gastrointestinal:  Negative for abdominal distention and abdominal pain.  ?Genitourinary:  Negative for dysuria.  ?Musculoskeletal:  Negative for arthralgias.  ?Skin:  Negative for pallor.  ?Neurological:  Positive for headaches.  ?Hematological:  Does not bruise/bleed easily.  ?All other systems reviewed and are negative. ? ?Physical Exam ?Updated Vital Signs ?BP (!) 178/78   Pulse 61   Temp (!) 97.5 ?F (36.4 ?C)   Resp 14   Ht _0  (1.575 m)   Wt 61.2 kg   SpO2 100%   BMI 24.69 kg/m?  ?Physical Exam ?Vitals and nursing note reviewed.  ?Constitutional:   ?   General: She is not in acute distress. ?   Appearance: She is well-developed and normal weight. She is not ill-appearing, toxic-appearing or diaphoretic.  ?HENT:  ?   Head: Normocephalic and atraumatic.  ?   Mouth/Throat:  ?   Mouth: Mucous membranes are moist.  ?   Pharynx: Oropharynx is clear.  ?Eyes:  ?   General: No visual field deficit. ?   Extraocular Movements: Extraocular movements intact.  ?   Pupils: Pupils are equal, round, and reactive to light.  ?Cardiovascular:  ?   Rate and Rhythm: Normal rate and regular rhythm.  ?   Heart sounds: Normal heart sounds.  ?Pulmonary:  ?   Effort: Pulmonary effort is normal.  ?   Breath sounds: Normal breath sounds.  ?Abdominal:  ?   General: Bowel sounds are normal.  ?   Palpations: Abdomen is soft.  ?Musculoskeletal:     ?   General: Normal range of motion.  ?   Cervical back: Normal range of motion and neck supple.  ?Skin: ?   General: Skin is warm and dry.  ?Neurological:  ?   Mental Status: She is alert and oriented to person, place, and time.  ?   GCS: GCS eye subscore is 4. GCS verbal subscore is 5. GCS motor subscore is 6.  ?   Cranial Nerves: No cranial nerve deficit, dysarthria or facial asymmetry.  ?   Sensory: No sensory deficit.  ?   Motor: No weakness, seizure activity or  pronator drift.  ?   Coordination: Romberg sign negative. Finger-Nose-Finger Test and Heel to [place] Test normal.  ?   Gait: Gait is intact.  ?   Deep Tendon Reflexes: Reflexes are normal and symmetric. Reflexes normal.  ?Psychiatric:     ?   Mood and Affect: Mood normal.     ?   Behavior: Behavior normal.  ? ? ?ED Results / Procedures / Treatments   ?Labs ?(all labs ordered are listed, but only abnormal results are displayed) ?Labs Reviewed  ?CBC - Abnormal; Notable for the following components:  ?    Result Value  ? Hemoglobin 11.0 (*)   ? HCT  35.0 (*)   ? All other components within normal limits  ?COMPREHENSIVE METABOLIC PANEL - Abnormal; Notable for the following components:  ? Glucose, Bld 107 (*)   ? Creatinine, Ser 1.33 (*)   ? GFR, Estimated 39 (*)   ? All other components within normal limits  ? ? ?EKG ?None ? ?Radiology ?CT HEAD WO CONTRAST (5MM) ? ?Result Date: 08/18/2021 ?CLINICAL DATA:  Headache, new or worsening (Age >= 50y) EXAM: CT HEAD WITHOUT CONTRAST TECHNIQUE: Contiguous axial images were obtained from the base of the skull through the vertex without intravenous contrast. RADIATION DOSE REDUCTION: This exam was performed according to the departmental dose-optimization program which includes automated exposure control, adjustment of the mA and/or kV according to patient size and/or use of iterative reconstruction technique. COMPARISON:  CT head May 30, 2017. FINDINGS: Brain: No evidence of acute infarction, hemorrhage, hydrocephalus, extra-axial collection or mass lesion/mass effect. Patchy white matter hypodensities, nonspecific but compatible with chronic microvascular ischemic disease. Mild for age cerebral atrophy. Vascular: Calcific intracranial atherosclerosis. No hyperdense vessel identified. Skull: No acute fracture. Sinuses/Orbits: Inferior right maxillary sinus mucosal thickening. No acute orbital findings. Other: No mastoid effusions. IMPRESSION: No evidence of acute intracranial  abnormality. Electronically Signed   By: Margaretha Sheffield M.D.   On: 08/18/2021 17:25   ? ?Procedures ?Procedures  ? ? ?Medications Ordered in ED ?Medications - No data to display ? ?ED Course/ Medical Decision Making/ A

## 2021-10-20 ENCOUNTER — Ambulatory Visit: Payer: 59 | Admitting: Internal Medicine

## 2021-12-16 ENCOUNTER — Ambulatory Visit (INDEPENDENT_AMBULATORY_CARE_PROVIDER_SITE_OTHER): Payer: 59 | Admitting: Adult Health

## 2021-12-16 ENCOUNTER — Encounter: Payer: Self-pay | Admitting: Adult Health

## 2021-12-16 VITALS — BP 143/83 | HR 78 | Ht 60.0 in | Wt 142.5 lb

## 2021-12-16 DIAGNOSIS — F0390 Unspecified dementia without behavioral disturbance: Secondary | ICD-10-CM | POA: Insufficient documentation

## 2021-12-16 DIAGNOSIS — N3941 Urge incontinence: Secondary | ICD-10-CM | POA: Insufficient documentation

## 2021-12-16 DIAGNOSIS — Z1211 Encounter for screening for malignant neoplasm of colon: Secondary | ICD-10-CM

## 2021-12-16 DIAGNOSIS — Z01419 Encounter for gynecological examination (general) (routine) without abnormal findings: Secondary | ICD-10-CM

## 2021-12-16 LAB — HEMOCCULT GUIAC POC 1CARD (OFFICE): Fecal Occult Blood, POC: NEGATIVE

## 2021-12-16 NOTE — Progress Notes (Signed)
Patient ID: Amy Stein, female   DOB: 1933/09/12, 88 y.o.   MRN: [id number] History of Present Illness: Amy Stein is a 88 year old black female, single, PM in for a well woman gyn exam, at her request. She has PCA with her. PCP is Dr Moss Mc.   Current Medications, Allergies, Past Medical History, Past Surgical History, Family History and Social History were reviewed in [employer] record.     Review of Systems: Patient denies any headaches, hearing loss, fatigue, blurred vision, shortness of breath, chest pain, abdominal pain, problems with bowel movements(did have big hard BM and started eating more fruit and is better),  or intercourse(she says she has sex). No joint pain or mood swings. Denies any vaginal bleeding. +urge incontinence    Physical Exam:BP (!) 143/83 (BP Location: Left Arm, Patient Position: Sitting, Cuff Size: Normal)   Pulse 78   Ht 5' (1.524 m)   Wt 142 lb 8 oz (64.6 kg)   BMI 27.83 kg/m   General:  Well developed, well nourished, no acute distress Skin:  Warm and dry Neck:  Midline trachea, normal thyroid, good ROM, no lymphadenopathy,no carotid bruits heard Lungs; Clear to auscultation bilaterally Breast:  No dominant palpable mass, retraction, or nipple discharge Cardiovascular: Regular rate and rhythm Abdomen:  Soft, non tender, no hepatosplenomegaly Pelvic:  External genitalia is normal in appearance, no lesions.  The vagina is pale with loss of rugae. Urethra has no lesions or masses. The cervix is smooth.  Uterus is felt to be normal size, shape, and contour.  No adnexal masses or tenderness noted.Bladder is non tender, no masses felt. Rectal: Good sphincter tone, no polyps, or hemorrhoids felt.  Hemoccult negative. Extremities/musculoskeletal:  No swelling or varicosities noted, no clubbing or cyanosis Psych:  No mood changes, cooperative,seems happy AA is 0 Fall risk is high,uses cane    12/16/2021   10:22 AM 08/15/2020    8:46 AM  04/10/2019   10:09 AM  Depression screen PHQ 2/9  Decreased Interest 0 1 0  Down, Depressed, Hopeless 0 0 0  PHQ - 2 Score 0 1 0  Altered sleeping 0 0   Tired, decreased energy 0 0   Change in appetite 0 0   Feeling bad or failure about yourself  0 0   Trouble concentrating 0 0   Moving slowly or fidgety/restless 0 0   Suicidal thoughts 0 0   PHQ-9 Score 0 1        12/16/2021   10:22 AM 08/15/2020    8:46 AM  GAD 7 : Generalized Anxiety Score  Nervous, Anxious, on Edge 0 0  Control/stop worrying 0 0  Worry too much - different things 0 0  Trouble relaxing 0 0  Restless 0 0  Easily annoyed or irritable 0 0  Afraid - awful might happen 0 0  Total GAD 7 Score 0 0      Upstream - 12/16/21 1021       Pregnancy Intention Screening   Does the patient want to become pregnant in the next year? N/A    Does the patient's partner want to become pregnant in the next year? N/A    Would the patient like to discuss contraceptive options today? N/A      Contraception Wrap Up   Current Method No Method - Other Reason   postmenopausal   End Method No Method - Other Reason   postmenopausal   Contraception Counseling Provided No  Examination chaperoned by Levy Pupa LPN   Impression and Plan: 1. Encounter for well woman exam with routine gynecological exam No pap needed Had normal mammogram 02/12/21 Lab with PCP Physical with PCP She does not have to return unless she wants to   2. Encounter for screening fecal occult blood testing Hemoccult was negative   3. Urge incontinence of urine Try  to pee often Wear pad   4. Dementia, unspecified dementia severity, unspecified dementia type, unspecified whether behavioral, psychotic, or mood disturbance or anxiety ([place])

## 2021-12-17 ENCOUNTER — Ambulatory Visit (INDEPENDENT_AMBULATORY_CARE_PROVIDER_SITE_OTHER): Payer: 59 | Admitting: Nurse Practitioner

## 2021-12-17 ENCOUNTER — Encounter: Payer: Self-pay | Admitting: Nurse Practitioner

## 2021-12-17 VITALS — BP 114/66 | HR 81 | Ht 62.0 in | Wt 140.4 lb

## 2021-12-17 DIAGNOSIS — E782 Mixed hyperlipidemia: Secondary | ICD-10-CM

## 2021-12-17 DIAGNOSIS — N1832 Chronic kidney disease, stage 3b: Secondary | ICD-10-CM | POA: Diagnosis not present

## 2021-12-17 DIAGNOSIS — I10 Essential (primary) hypertension: Secondary | ICD-10-CM

## 2021-12-17 DIAGNOSIS — Z794 Long term (current) use of insulin: Secondary | ICD-10-CM

## 2021-12-17 DIAGNOSIS — E559 Vitamin D deficiency, unspecified: Secondary | ICD-10-CM | POA: Diagnosis not present

## 2021-12-17 DIAGNOSIS — E1122 Type 2 diabetes mellitus with diabetic chronic kidney disease: Secondary | ICD-10-CM | POA: Diagnosis not present

## 2021-12-17 LAB — POCT GLYCOSYLATED HEMOGLOBIN (HGB A1C): Hemoglobin A1C: 7.1 % — AB (ref 4.0–5.6)

## 2021-12-17 NOTE — Progress Notes (Signed)
Endocrinology follow-up  Note       12/17/2021, 1:48 PM   Subjective:    Patient ID: Amy Stein, female    DOB: 12/10/1933.  Amy Stein is being seen in follow-up for management of currently uncontrolled symptomatic type 2 diabetes, hypertension, hyperlipidemia. PMD:   Carrolyn Meiers, MD.   Past Medical History:  Diagnosis Date   Dementia [facility])    Diabetes mellitus    Hypertension    Shingles    on both legs 1/18   Past Surgical History:  Procedure Laterality Date   COLONOSCOPY     COLONOSCOPY N/A 02/23/2017   Procedure: COLONOSCOPY;  Surgeon: Daneil Dolin, MD;  Location: [facility];  Service: Endoscopy;  Laterality: N/A;  9:30 Am   POLYPECTOMY  02/23/2017   Procedure: POLYPECTOMY;  Surgeon: Daneil Dolin, MD;  Location: [facility];  Service: Endoscopy;;  cecum   SHOULDER SURGERY Left    August 2022   Social History   Socioeconomic History   Marital status: Single    Spouse name: Not on file   Number of children: Not on file   Years of education: Not on file   Highest education level: Not on file  Occupational History   Not on file  Tobacco Use   Smoking status: Former   Smokeless tobacco: Never  Vaping Use   Vaping Use: Never used  Substance and Sexual Activity   Alcohol use: No   Drug use: No   Sexual activity: Yes    Birth control/protection: Post-menopausal  Other Topics Concern   Not on file  Social History Narrative   Not on file   Social Determinants of Health   Financial Resource Strain: Low Risk  (12/16/2021)   Overall Financial Resource Strain (CARDIA)    Difficulty of Paying Living Expenses: Not hard at all  Food Insecurity: No Food Insecurity (12/16/2021)   Hunger Vital Sign    Worried About Running Out of Food in the Last Year: Never true    Ran Out of Food in the Last Year: Never true  Transportation Needs: No Transportation Needs (12/16/2021)    PRAPARE - Hydrologist (Medical): No    Lack of Transportation (Non-Medical): No  Physical Activity: Insufficiently Active (12/16/2021)   Exercise Vital Sign    Days of Exercise per Week: 7 days    Minutes of Exercise per Session: 10 min  Stress: No Stress Concern Present (12/16/2021)   [place]    Feeling of Stress : Not at all  Social Connections: Unknown (12/16/2021)   Social Connection and Isolation Panel [NHANES]    Frequency of Communication with Friends and Family: More than three times a week    Frequency of Social Gatherings with Friends and Family: Three times a week    Attends Religious Services: Patient refused    Active Member of Clubs or Organizations: No    Attends Archivist Meetings: Patient refused    Marital Status: Patient refused   Outpatient Encounter Medications as of 12/17/2021  Medication Sig   Acetaminophen (TYLENOL PO) Take by  mouth.   amLODipine (NORVASC) 5 MG tablet Take 5 mg by mouth daily.   aspirin EC 81 MG tablet Take 81 mg by mouth daily. Swallow whole.   ferrous sulfate 325 (65 FE) MG tablet Take 1 tablet (325 mg total) by mouth 2 (two) times daily with a meal. (Patient taking differently: Take 325 mg by mouth 2 (two) times daily with a meal. It is reported that hs ei staking once a day.)   gabapentin (NEURONTIN) 400 MG capsule Take 400 mg by mouth 3 (three) times daily. Patient is taking two a day   HM VITAMIN D3 100 MCG (4000 UT) CAPS TAKE ONE CAPSULE (5000 UNITS TOTAL) BY MOUTH DAILY   hydrochlorothiazide (MICROZIDE) 12.5 MG capsule Take 12.5 mg by mouth daily.   hydrocortisone 2.5 % cream Apply topically 2 (two) times daily.   insulin glargine (LANTUS SOLOSTAR) 100 UNIT/ML Solostar Pen Inject 5 Units into the skin at bedtime.   Insulin Pen Needle (PEN NEEDLES) 32G X 4 MM MISC 1 each by Does not apply route at bedtime.   labetalol (NORMODYNE) 100 MG  tablet Take 100 mg by mouth 2 (two) times daily.   loratadine (CLARITIN) 10 MG tablet Take 10 mg by mouth daily.   losartan (COZAAR) 100 MG tablet Take 100 mg by mouth daily.   omeprazole (PRILOSEC) 20 MG capsule Take 20 mg by mouth daily.    oxybutynin (DITROPAN-XL) 10 MG 24 hr tablet Take 10 mg by mouth daily.   simvastatin (ZOCOR) 40 MG tablet Take 40 mg by mouth at bedtime.    TRADJENTA 5 MG TABS tablet Take 1 tablet (5 mg total) by mouth daily.   UNABLE TO FIND Cream for feet-daily   vitamin B-12 1000 MCG tablet Take 1 tablet (1,000 mcg total) by mouth daily.   ACCU-CHEK GUIDE test strip USE TO TEST THREE TIMESCDAILY. (Patient not taking: Reported on 12/17/2021)   Accu-Chek Softclix Lancets lancets daily. (Patient not taking: Reported on 12/16/2021)   Blood Glucose Monitoring Suppl (ACCU-CHEK GUIDE) w/Device KIT  (Patient not taking: Reported on 12/16/2021)   fluticasone (FLONASE) 50 MCG/ACT nasal spray Place 1 spray into both nostrils daily for 14 days.   No facility-administered encounter medications on file as of 12/17/2021.    ALLERGIES: Allergies  Allergen Reactions   Sulfa Antibiotics    Sulfonamide Derivatives Rash    VACCINATION STATUS:  There is no immunization history on file for this patient.  Diabetes She presents for her follow-up diabetic visit. She has type 2 diabetes mellitus. Onset time: She was diagnosed at approximate age of 65 years. Her disease course has been stable. There are no hypoglycemic associated symptoms. Pertinent negatives for hypoglycemia include no confusion, headaches, pallor or seizures. There are no diabetic associated symptoms. Pertinent negatives for diabetes include no chest pain, no polydipsia, no polyphagia and no polyuria. There are no hypoglycemic complications. Symptoms are stable. Diabetic complications include nephropathy. Risk factors for coronary artery disease include diabetes mellitus, hypertension, sedentary lifestyle, tobacco exposure,  dyslipidemia and post-menopausal. Current diabetic treatment includes oral agent (monotherapy) and insulin injections. She is compliant with treatment all of the time. Her weight is fluctuating minimally. She is following a generally unhealthy diet. When asked about meal planning, she reported none. She has not had a previous visit with a dietitian. She rarely participates in exercise. Her home blood glucose trend is fluctuating minimally. (She presents today, accompanied by her aide, with her logs showing inconsistent glucose monitoring.  She has not  checked her glucose since 7/26 due to pain in fingertips.  Her POCT A1c today is 7.1%, improving from last visit of 7.6%.  ) An ACE inhibitor/angiotensin II receptor blocker is not being taken (was taken off by nephrologist). She does not see a podiatrist.Eye exam is current.  Hypertension This is a chronic problem. The current episode started more than 1 year ago. The problem has been gradually improving since onset. The problem is controlled. Pertinent negatives include no chest pain, headaches, palpitations or shortness of breath. There are no associated agents to hypertension. Risk factors for coronary artery disease include diabetes mellitus, sedentary lifestyle, smoking/tobacco exposure, post-menopausal state and dyslipidemia. Past treatments include diuretics, calcium channel blockers and beta blockers. The current treatment provides moderate improvement. Compliance problems include exercise.  Hypertensive end-organ damage includes kidney disease. Identifiable causes of hypertension include chronic renal disease.     Review of systems  Constitutional: + Minimally fluctuating body weight,  current Body mass index is 25.68 kg/m. , no fatigue, no subjective hyperthermia, no subjective hypothermia, apparent memory issues Eyes: no blurry vision, no xerophthalmia ENT: no sore throat, no nodules palpated in throat, no dysphagia/odynophagia, no  hoarseness Cardiovascular: no chest pain, no shortness of breath, no palpitations, no leg swelling Respiratory: no cough, no shortness of breath Gastrointestinal: no nausea/vomiting/diarrhea Musculoskeletal: no muscle/joint aches, walks with cane  Skin: no rashes, no hyperemia Neurological: no tremors, no numbness, no tingling, no dizziness Psychiatric: no depression, no anxiety    Objective:    BP 114/66 (BP Location: Left Arm, Patient Position: Sitting, Cuff Size: Normal)   Pulse 81   Ht '5\' 2"'  (1.575 m)   Wt 140 lb 6.4 oz (63.7 kg)   BMI 25.68 kg/m   Wt Readings from Last 3 Encounters:  12/17/21 140 lb 6.4 oz (63.7 kg)  12/16/21 142 lb 8 oz (64.6 kg)  08/18/21 135 lb (61.2 kg)    BP Readings from Last 3 Encounters:  12/17/21 114/66  12/16/21 (!) 143/83  08/18/21 (!) 178/78     Physical Exam- Limited  Constitutional:  Body mass index is 25.68 kg/m. , not in acute distress, Memory impairment Eyes:  EOMI, no exophthalmos Neck: Supple Cardiovascular: RRR, no murmurs, rubs, or gallops, no edema Respiratory: Adequate breathing efforts, no crackles, rales, rhonchi, or wheezing Musculoskeletal: no gross deformities, strength intact in all four extremities, no gross restriction of joint movements, walks with cane Skin:  no rashes, no hyperemia Neurological: no tremor with outstretched hands      CMP     Component Value Date/Time   NA 137 08/18/2021 1609   NA 143 04/24/2020 1044   K 3.9 08/18/2021 1609   CL 105 08/18/2021 1609   CO2 24 08/18/2021 1609   GLUCOSE 107 (H) 08/18/2021 1609   BUN 23 08/18/2021 1609   BUN 21 04/24/2020 1044   CREATININE 1.33 (H) 08/18/2021 1609   CREATININE 2.00 (H) 09/18/2019 0848   CALCIUM 9.2 08/18/2021 1609   PROT 6.8 08/18/2021 1609   PROT 7.1 04/24/2020 1044   ALBUMIN 3.6 08/18/2021 1609   ALBUMIN 4.1 04/24/2020 1044   AST 20 08/18/2021 1609   ALT 14 08/18/2021 1609   ALKPHOS 78 08/18/2021 1609   BILITOT 0.6 08/18/2021 1609    BILITOT 0.2 04/24/2020 1044   GFRNONAA 39 (L) 08/18/2021 1609   GFRNONAA 23 (L) 09/18/2019 0848   GFRAA 42 (L) 04/24/2020 1044   GFRAA 26 (L) 09/18/2019 0848    Diabetic Labs (most recent): Lab Results  Component Value Date   HGBA1C 7.1 (A) 12/17/2021   HGBA1C 7.6 (A) 08/12/2021   HGBA1C 6.9 01/02/2021   MICROALBUR 2.6 09/18/2019   MICROALBUR 0.8 02/19/2019   MICROALBUR 0.6 01/31/2018      Lab Results  Component Value Date   TSH 0.936 07/01/2021   TSH 1.68 09/18/2019   TSH 1.29 01/31/2018   TSH 0.793 05/26/2017   FREET4 0.75 07/01/2021   FREET4 0.9 09/18/2019   FREET4 0.9 01/31/2018   Lipid Panel     Component Value Date/Time   CHOL 148 07/01/2021 1241   TRIG 99 07/01/2021 1241   HDL 42 07/01/2021 1241   CHOLHDL 3.5 07/01/2021 1241   VLDL 20 07/01/2021 1241   LDLCALC 86 07/01/2021 1241   LDLCALC 67 09/18/2019 0848      Assessment & Plan:   1) Type 2 diabetes mellitus with stage 3 chronic kidney disease, with long-term current use of insulin ([place])  - Aletha Halim Valdes has currently uncontrolled symptomatic type 2 DM since 88 years of age.    She presents today, accompanied by her aide, with her logs showing inconsistent glucose monitoring.  She has not checked her glucose since 7/26 due to pain in fingertips.  Her POCT A1c today is 7.1%, improving from last visit of 7.6%.    - Recent labs reviewed.  -her diabetes is complicated by stage 4 renal insufficiency and Amy Stein remains at a high risk for more acute and chronic complications which include CAD, CVA, CKD, retinopathy, and neuropathy. These are all discussed in detail with the patient.  - Nutritional counseling repeated at each appointment due to patients tendency to fall back in to old habits.  - The patient admits there is a room for improvement in their diet and drink choices. -  Suggestion is made for the patient to avoid simple carbohydrates from their diet including Cakes, Sweet Desserts /  Pastries, Ice Cream, Soda (diet and regular), Sweet Tea, Candies, Chips, Cookies, Sweet Pastries, Store Bought Juices, Alcohol in Excess of 1-2 drinks a day, Artificial Sweeteners, Coffee Creamer, and "Sugar-free" Products. This will help patient to have stable blood glucose profile and potentially avoid unintended weight gain.   - I encouraged the patient to switch to unprocessed or minimally processed complex starch and increased protein intake (animal or plant source), fruits, and vegetables.   - Patient is advised to stick to a routine mealtimes to eat 3 meals a day and avoid unnecessary snacks (to snack only to correct hypoglycemia).  - I have approached her with the following individualized plan to manage diabetes and patient agrees:   -She is elderly patient with several comorbidities,  #1 priority and treating her diabetes would be to avoid hypoglycemia.    -Will stop her Lantus today for safety purposes since she has not been checking glucose.  She is advised to continue Tradjenta 5 mg po daily.  -She can officially take a break from glucose monitoring at this time due to safe medication regimen.  -She is not a candidate for metformin, SGLT2 inhibitors due to CKD.  -- Patient specific target  A1c;  LDL, HDL, Triglycerides, and  Waist Circumference were discussed in detail.  2) BP/HTN:  Her blood pressure is controlled to target.  She is advised to continue Amlodipine 5 mg po daily, HCTZ 12.5 mg po daily, Labetalol 100 mg po twice daily and Losartan 100 mg po daily.  Will defer med changes to nephrologist.  3) Lipids/HPL:  Her most recent lipid panel from 07/01/21 shows controlled LDL of 86.  She is advised to continue Simvastatin 40 mg po daily at bedtime.  Side effects and precautions discussed with her.    4)  Weight/Diet:  Her Body mass index is 25.68 kg/m.-not a candidate for weight loss.  CDE Consult will be initiated, exercise, and detailed carbohydrates information  provided.  5) Vitamin D deficiency- -Her most recent vitamin D level was 103 on 07/01/21.  She is advised to stop all vitamin D supplementation at this time.   6) Chronic Care/Health Maintenance: -she is on ARB and Statin medications and is encouraged to initiate and continue to follow up with Ophthalmology, Dentist, Podiatrist at least yearly or according to recommendations, and advised to  stay away from smoking. I have recommended yearly flu vaccine and pneumonia vaccine at least every 5 years; moderate intensity exercise for up to 150 minutes weekly; and  sleep for at least 7 hours a day.  - I advised patient to maintain close follow up with Carrolyn Meiers, MD for primary care needs.      I spent 42 minutes in the care of the patient today including review of labs from [place], Lipids, Thyroid Function, Hematology (current and previous including abstractions from other facilities); face-to-face time discussing  her blood glucose readings/logs, discussing hypoglycemia and hyperglycemia episodes and symptoms, medications doses, her options of short and long term treatment based on the latest standards of care / guidelines;  discussion about incorporating lifestyle medicine;  and documenting the encounter. Risk reduction counseling performed per USPSTF guidelines to reduce obesity and cardiovascular risk factors.     Please refer to Patient Instructions for Blood Glucose Monitoring and Insulin/Medications Dosing Guide"  in media tab for additional information. Please  also refer to " Patient Self Inventory" in the Media  tab for reviewed elements of pertinent patient history.  Amy Stein participated in the discussions, expressed understanding, and voiced agreement with the above plans.  All questions were answered to her satisfaction. she is encouraged to contact clinic should she have any questions or concerns prior to her return visit.   Follow up plan: - Return in about 3 months  (around 03/18/2022) for Diabetes F/U with A1c in office, No previsit labs, Bring meter and logs.   Rayetta Pigg, [facility] [address] [place], [place] [address] Phone: [phone] Fax: [phone]  12/17/2021, 1:48 PM

## 2021-12-21 DIAGNOSIS — G301 Alzheimer's disease with late onset: Secondary | ICD-10-CM | POA: Diagnosis not present

## 2021-12-31 DIAGNOSIS — E1142 Type 2 diabetes mellitus with diabetic polyneuropathy: Secondary | ICD-10-CM | POA: Diagnosis not present

## 2021-12-31 DIAGNOSIS — I10 Essential (primary) hypertension: Secondary | ICD-10-CM | POA: Diagnosis not present

## 2022-01-11 DIAGNOSIS — M79672 Pain in left foot: Secondary | ICD-10-CM | POA: Diagnosis not present

## 2022-01-11 DIAGNOSIS — M79674 Pain in right toe(s): Secondary | ICD-10-CM | POA: Diagnosis not present

## 2022-01-11 DIAGNOSIS — L609 Nail disorder, unspecified: Secondary | ICD-10-CM | POA: Diagnosis not present

## 2022-01-11 DIAGNOSIS — E1140 Type 2 diabetes mellitus with diabetic neuropathy, unspecified: Secondary | ICD-10-CM | POA: Diagnosis not present

## 2022-01-11 DIAGNOSIS — M79675 Pain in left toe(s): Secondary | ICD-10-CM | POA: Diagnosis not present

## 2022-01-11 DIAGNOSIS — L110 Acquired keratosis follicularis: Secondary | ICD-10-CM | POA: Diagnosis not present

## 2022-01-11 DIAGNOSIS — M79671 Pain in right foot: Secondary | ICD-10-CM | POA: Diagnosis not present

## 2022-01-11 DIAGNOSIS — I739 Peripheral vascular disease, unspecified: Secondary | ICD-10-CM | POA: Diagnosis not present

## 2022-01-13 IMAGING — MG MM DIGITAL DIAGNOSTIC UNILAT*R* W/ TOMO W/ CAD
4 series · 4 of 12 positions shown · non-contrast
Comparison: Previous exam(s).

CLINICAL DATA: Patient recalled from screening for right breast
mass.

EXAM:
DIGITAL DIAGNOSTIC RIGHT MAMMOGRAM WITH CAD AND TOMO
ULTRASOUND RIGHT BREAST

[R MLO synth-2D]
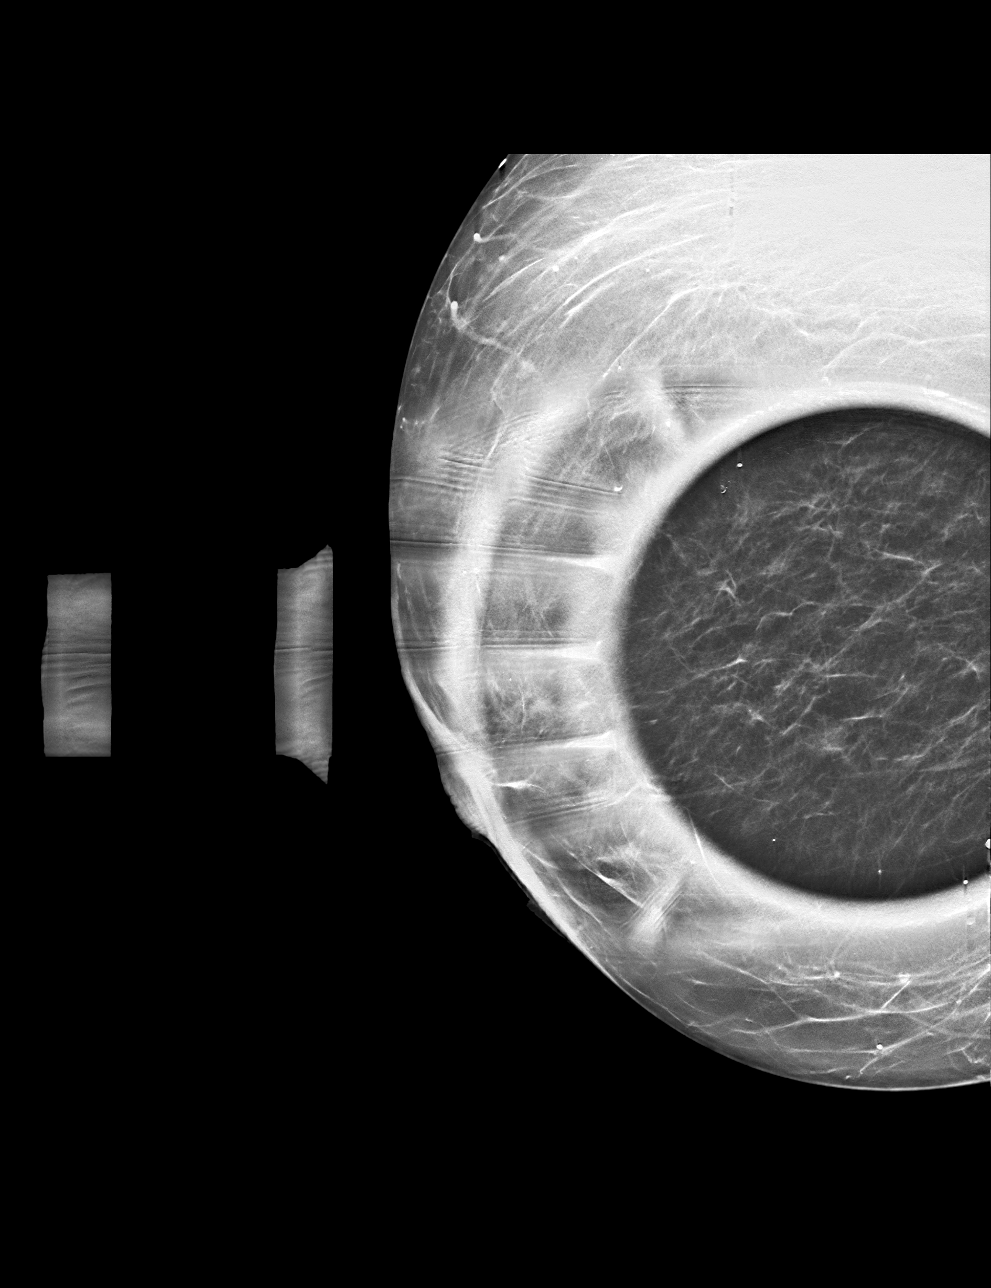

[R CC synth-2D]
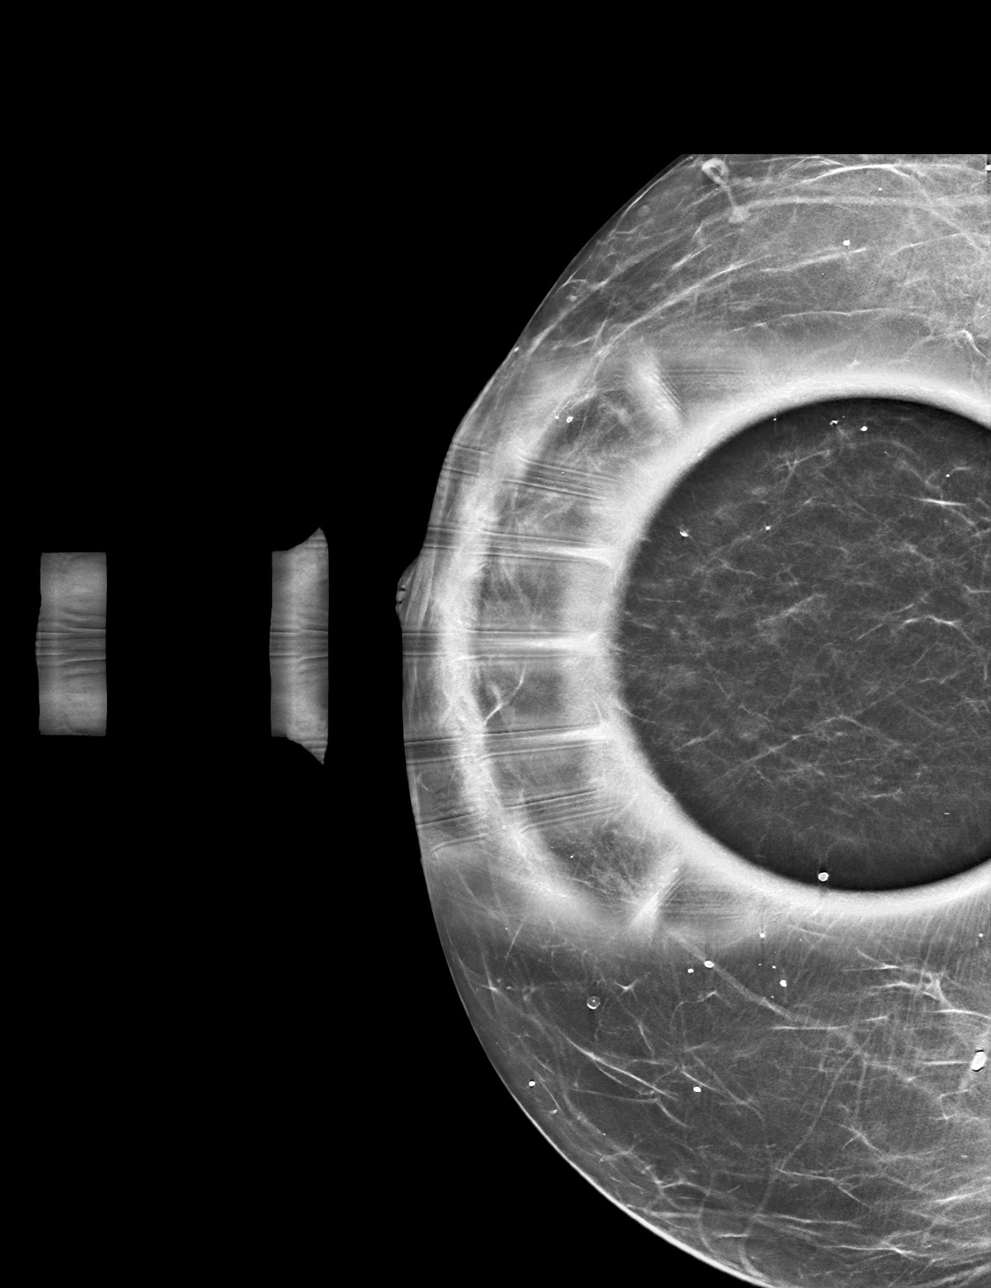

[R CC tomo · tomo slice 23/44.0]
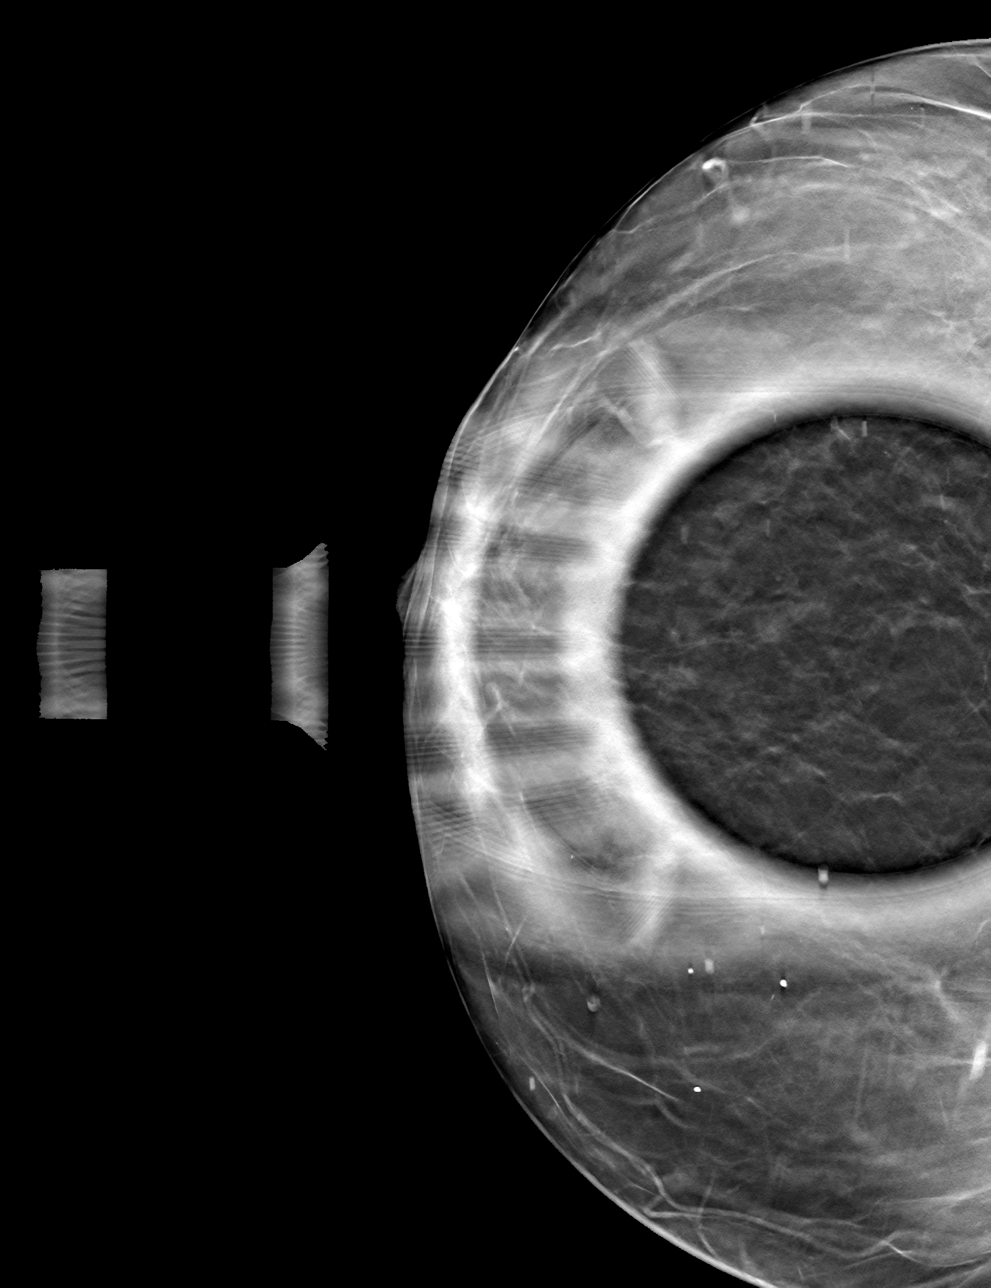

[R MLO tomo · tomo slice 21/42.0]
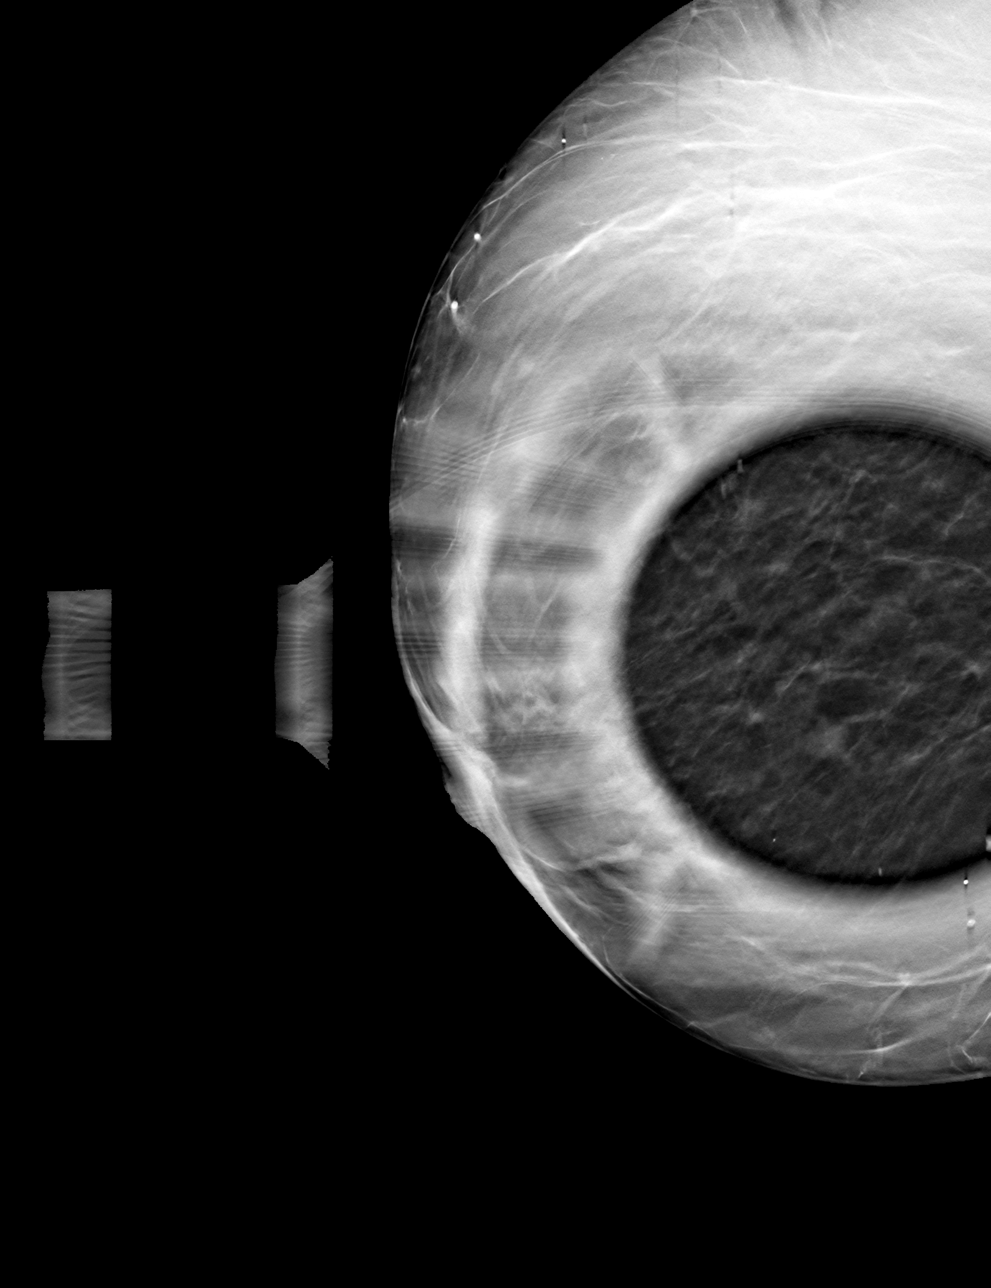

[4 of 12 positions shown; findings below may reference images not displayed]

ACR Breast Density Category b: There are scattered areas of
fibroglandular density.
FINDINGS: Within the right breast middle depth slightly lower and inner is a
persistent lobular low-density mass, further evaluated with spot
compression views.

Mammographic images were processed with CAD.

Targeted ultrasound is performed, showing a 6 x 11 x 3 mm solid and
cystic mass right breast 5 o'clock position 1 cm from the nipple.

No right axillary adenopathy.
IMPRESSION: Indeterminate right breast mass 5 o'clock position

RECOMMENDATION:
Ultrasound-guided core needle biopsy right breast mass 5 o'clock
position.

I have discussed the findings and recommendations with the patient.
If applicable, a reminder letter will be sent to the patient
regarding the next appointment.

BI-RADS CATEGORY  4: Suspicious.

## 2022-01-17 IMAGING — MG MM BREAST LOCALIZATION CLIP
4 series · 4 of 12 positions shown · non-contrast
Comparison: Previous exams.

CLINICAL DATA: Post ultrasound-guided biopsy of a mass in the right
breast at the 5 o'clock position.

EXAM:
DIAGNOSTIC RIGHT MAMMOGRAM POST ULTRASOUND BIOPSY

[R CC synth-2D]
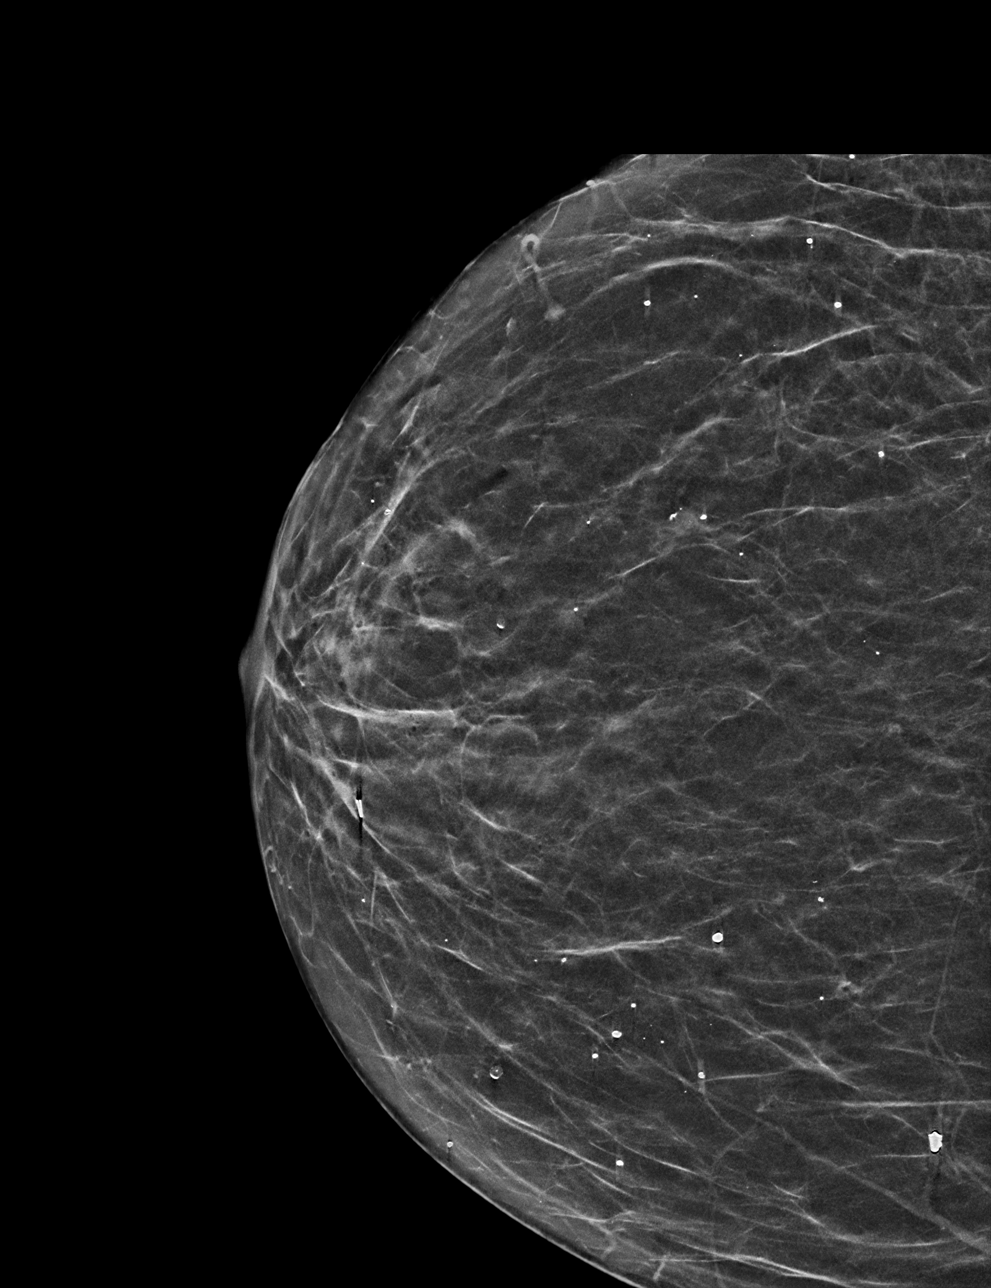

[R ML synth-2D]
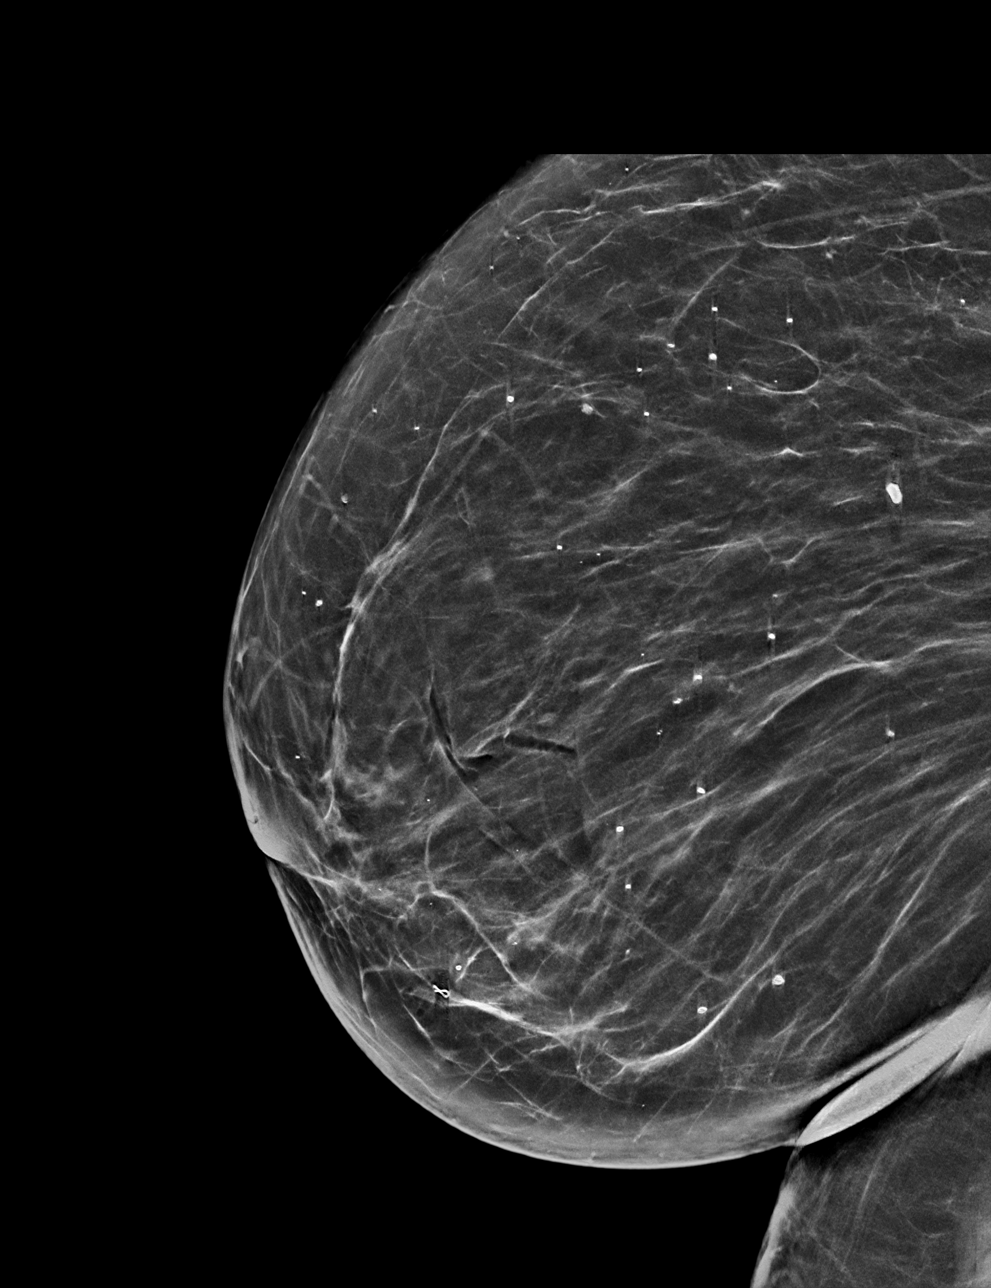

[R CC tomo · tomo slice 26/51.0]
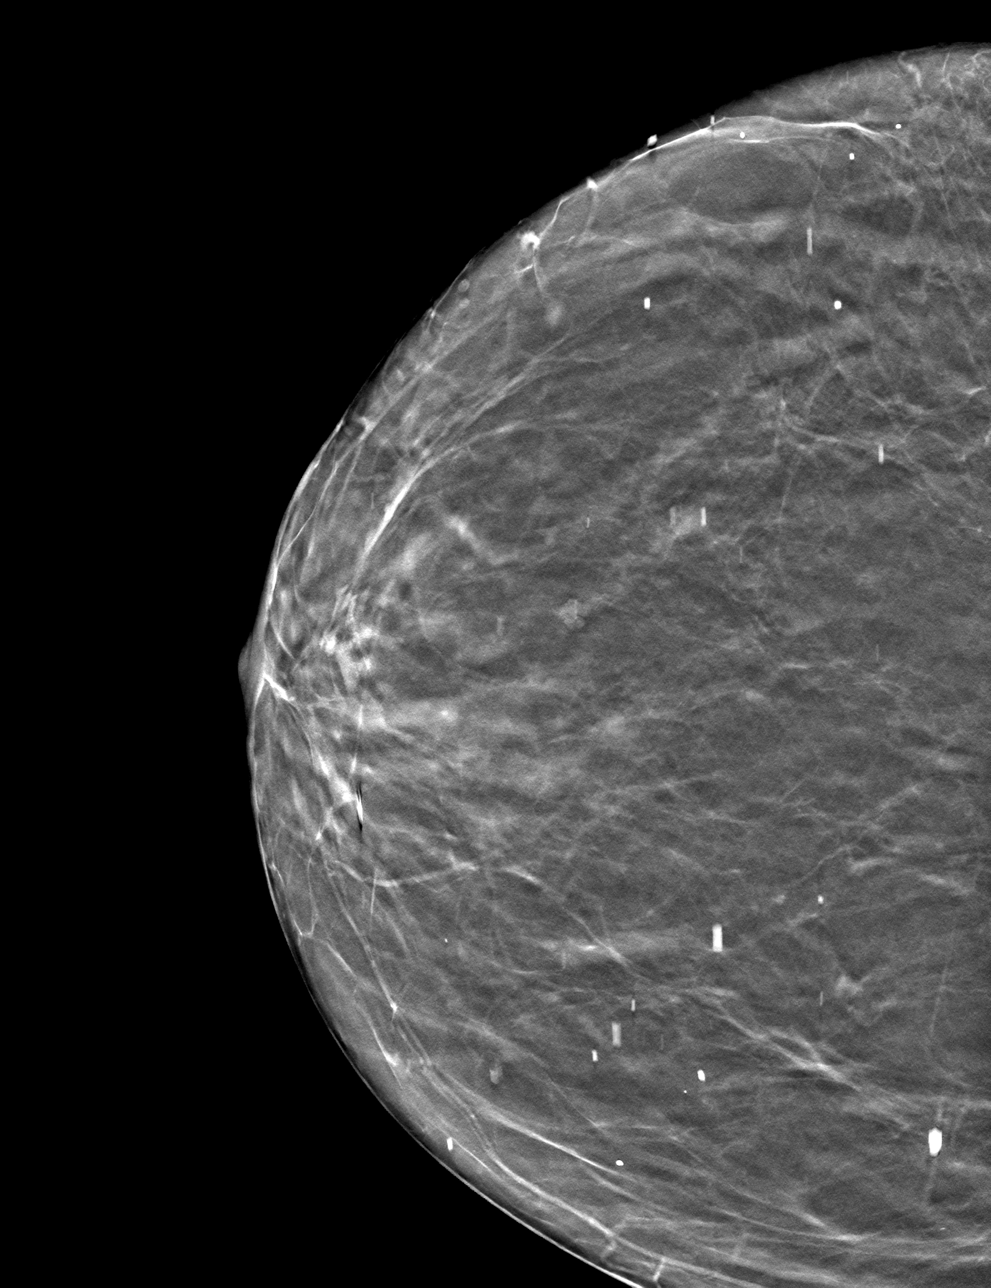

[R ML tomo · tomo slice 31/60.0]
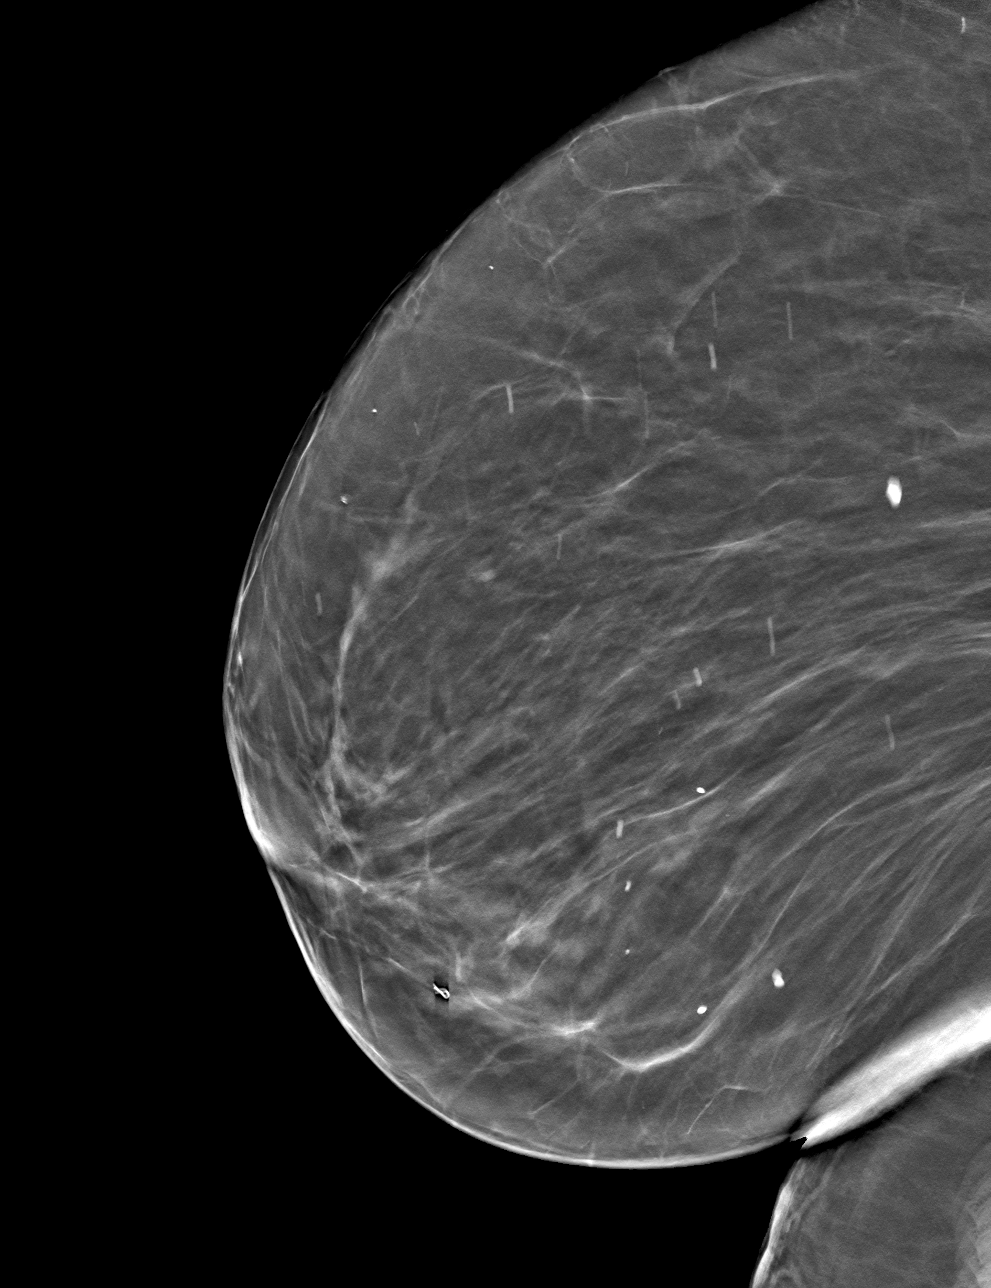

[4 of 12 positions shown; findings below may reference images not displayed]

FINDINGS: Mammographic images were obtained following ultrasound guided biopsy
of a mass in the right breast at 5 o'clock position. A ribbon shaped
biopsy marking clip is present however positioned approximately
cm anterior to the site of biopsy in the right breast at the 5
o'clock position, however the mass could not be visualized after the
first biopsy pass and therefore the clip was placed in the general
region of biopsy.
IMPRESSION: Ribbon shaped biopsy marking clip approximately 2.5 cm anterior to
the biopsied mass in the right breast at the 5 o'clock position.
Note that the mass could not be visualized after the first biopsy
pass and therefore the clip was placed in the general region of
biopsy.

Final Assessment: Post Procedure Mammograms for Marker Placement

## 2022-01-18 DIAGNOSIS — G301 Alzheimer's disease with late onset: Secondary | ICD-10-CM | POA: Diagnosis not present

## 2022-01-30 DIAGNOSIS — I10 Essential (primary) hypertension: Secondary | ICD-10-CM | POA: Diagnosis not present

## 2022-01-30 DIAGNOSIS — E1142 Type 2 diabetes mellitus with diabetic polyneuropathy: Secondary | ICD-10-CM | POA: Diagnosis not present

## 2022-02-02 ENCOUNTER — Other Ambulatory Visit (HOSPITAL_COMMUNITY): Payer: Self-pay | Admitting: Internal Medicine

## 2022-02-02 DIAGNOSIS — Z1231 Encounter for screening mammogram for malignant neoplasm of breast: Secondary | ICD-10-CM

## 2022-02-16 DIAGNOSIS — N390 Urinary tract infection, site not specified: Secondary | ICD-10-CM | POA: Diagnosis not present

## 2022-02-17 ENCOUNTER — Ambulatory Visit (HOSPITAL_COMMUNITY): Payer: 59

## 2022-02-18 ENCOUNTER — Ambulatory Visit (HOSPITAL_COMMUNITY)
Admission: RE | Admit: 2022-02-18 | Discharge: 2022-02-18 | Disposition: A | Discharge status: Home / Self Care | Payer: Medicare Other | Source: Ambulatory Visit | Attending: Internal Medicine | Admitting: Internal Medicine

## 2022-02-18 DIAGNOSIS — Z1231 Encounter for screening mammogram for malignant neoplasm of breast: Secondary | ICD-10-CM | POA: Insufficient documentation

## 2022-02-18 DIAGNOSIS — G301 Alzheimer's disease with late onset: Secondary | ICD-10-CM | POA: Diagnosis not present

## 2022-03-02 DIAGNOSIS — E1142 Type 2 diabetes mellitus with diabetic polyneuropathy: Secondary | ICD-10-CM | POA: Diagnosis not present

## 2022-03-02 DIAGNOSIS — I10 Essential (primary) hypertension: Secondary | ICD-10-CM | POA: Diagnosis not present

## 2022-03-07 ENCOUNTER — Emergency Department: Admit: 2022-03-08 | Payer: MEDICAID

## 2022-03-07 DIAGNOSIS — J069 Acute upper respiratory infection, unspecified: Secondary | ICD-10-CM

## 2022-03-07 NOTE — ED Triage Notes (Signed)
Arrived with her daughter    Chief complaint cough x 2 days. History of pneumonia. No ill contacts

## 2022-03-07 NOTE — ED Provider Notes (Signed)
[facility]  Emergency Department Treatment Report        Patient: Sharon MourningMary Roman Age: 88 y.o. Sex:  female    Date of Birth: 10/25/1933 Admit Date: 03/08/2022 PCP: No primary care provider on file.   MRN: [id number]  CSN: [id number]  APP: Fara ChuteJulia C Alexandra Lipps, PA-C    Room: ER27/ER27 Time Dictated: 4:32 AM Attending Ardyth HarpsMcManus, Kenneth D, DO    COSIGNER REQUIRED    Chief Complaint   Chief Complaint   Patient presents with    Cough       History of Present Illness   This is a 88 y.o. female presents with family for evaluation of cough.  Daughter states she been coughing a lot 2 or 3 days seem to get worse tonight.  Coughing up phlegm clear no fever no chills no vomiting diarrhea has been eating and drinking well.  Family is concerned that she does have a history of pneumonia.  [place]  No chest pain not short of breath. No Leg pain or swelling.  Patient also noted by family to be complaining of some abdominal pain when asked her where she points to her lower abdomen unable to tell me what type of pain it is.  Not associated with any urinary symptoms vomiting or diarrhea.    INDEPENDENT HISTORIAN:  none    Review of Systems   Review of Systems   Constitutional:  Negative for chills and fever.   Respiratory:  Positive for cough.    Cardiovascular:  Negative for chest pain and leg swelling.   Gastrointestinal:  Negative for nausea and vomiting.   Genitourinary:  Negative for dysuria.   Neurological:  Negative for weakness and numbness.   All other systems reviewed and are negative.        Past Medical/Surgical History   No past medical history on file.  No past surgical history on file.  Hypertension, diabetes  Social History       No tobacco  Social History     Socioeconomic History    Marital status: Single     Spouse name: Not on file    Number of children: Not on file    Years of education: Not on file    Highest education level: Not on file   Occupational History    Not on file    Tobacco Use    Smoking status: Not on file    Smokeless tobacco: Not on file   Substance and Sexual Activity    Alcohol use: Not on file    Drug use: Not on file    Sexual activity: Not on file   Other Topics Concern    Not on file   Social History Narrative    Not on file     Social Determinants of Health     Financial Resource Strain: Not on file   Food Insecurity: Not on file   Transportation Needs: Not on file   Physical Activity: Not on file   Stress: Not on file   Social Connections: Not on file   Intimate Partner Violence: Not on file   Housing Stability: Not on file           Family History   No family history on file.      Current Medications   No Longer on insulin  No current facility-administered medications for this encounter.     Current Outpatient Medications   Medication Sig Dispense Refill  benzonatate (TESSALON) 100 MG capsule Take 1 capsule by mouth 3 times daily as needed for Cough 15 capsule 0       Allergies   No Known Allergies    Physical Exam   Patient Vitals for the past 24 hrs:   Temp Pulse Resp BP SpO2   03/08/22 0415 -- -- -- -- 97 %   03/08/22 0149 -- -- -- -- 99 %   03/07/22 2327 99 F (37.2 C) 80 20 119/80 97 %     Physical Exam  Vitals and nursing note reviewed.   Constitutional:       Appearance: Normal appearance.      Comments: Very pleasant   HENT:      Mouth/Throat:      Pharynx: Oropharynx is clear.   Eyes:      General: No scleral icterus.     Conjunctiva/sclera: Conjunctivae normal.      Pupils: Pupils are equal, round, and reactive to light.   Cardiovascular:      Rate and Rhythm: Normal rate and regular rhythm.   Pulmonary:      Comments: Coarse sounding cough not hearing wheezing rhonchi or rales she is not tachypneic or dyspneic with conversation  Abdominal:      General: Bowel sounds are normal.      Palpations: Abdomen is soft.      Tenderness: There is no abdominal tenderness.      Comments: Abdomen soft and nontender I cannot reproduce any tenderness no  organomegaly no masses   Musculoskeletal:      Cervical back: Normal range of motion and neck supple. No tenderness.      Comments: Extremities are warm dry well-perfused nontender   Skin:     General: Skin is warm and dry.      Capillary Refill: Capillary refill takes less than 2 seconds.   Neurological:      General: No focal deficit present.      Mental Status: She is alert.   Psychiatric:         Mood and Affect: Mood normal.         Behavior: Behavior normal.          Impression and Management Plan   88 year old female brought by family for evaluation of cough worsening over the last couple days at this time a chest x-ray be obtained to make sure she does not have pneumonia.  Not hypoxic, not tachycardic.  She was states she was having some lower abdominal discomfort and points to suprapubic area we will check a urine we will check some baseline lab work we will send off a COVID,influenza and RSV swab.    Differential diagnoses: Pneumonia, bronchitis, bronchospasm pleural effusion UTI    Personal Equipment worn: N95 mask   Diagnostic Studies   Lab:   Results for orders placed or performed during the hospital encounter of 03/08/22   COVID-19, Flu A/B, and RSV Combo    Specimen: Nasopharyngeal Swab   Result Value Ref Range    SARS-CoV-2 NEGATIVE NEGATIVE      Influenza A H1 (Seasonal) PCR NEGATIVE NEGATIVE      Influenza virus B RNA NEGATIVE NEGATIVE      RSV A/B PCR NEGATIVE NEGATIVE     CBC with Auto Differential   Result Value Ref Range    WBC 9.8 4.0 - 11.0 1000/mm3    RBC 4.14 3.60 - 5.20 M/uL    Hemoglobin 11.6 11.0 - 16.0 gm/dl  Hematocrit 36.7 35.0 - 47.0 %    MCV 88.6 80.0 - 98.0 fL    MCH 28.0 25.4 - 34.6 pg    MCHC 31.6 30.0 - 36.0 gm/dl    Platelets FTNT 140 - 450 1000/mm3    MPV 11.5 (H) 6.0 - 10.0 fL    RDW 45.4 36.4 - 46.3      Nucleated RBCs 0 0 - 0      Immature Granulocytes 0.6 0.0 - 3.0 %    Neutrophils Segmented 75.5 (H) 34 - 64 %    Lymphocytes 11.4 (L) 28 - 48 %    Monocytes 8.8 1 - 13 %     Eosinophils 3.3 0 - 5 %    Basophils 0.4 0 - 3 %   Basic Metabolic Panel   Result Value Ref Range    Potassium 4.1 3.5 - 5.1 mEq/L    Chloride 108 (H) 98 - 107 mEq/L    Sodium 139 136 - 145 mEq/L    CO2 22 20 - 31 mEq/L    Glucose 146 (H) 74 - 106 mg/dl    BUN 18 9 - 23 mg/dl    Creatinine 1.18 (H) 0.55 - 1.02 mg/dl    GFR African American 56.0      GFR Non-African American 46      Calcium 8.9 8.7 - 10.4 mg/dl    Anion Gap 9 5 - 15 mmol/L   Hepatic Function Panel   Result Value Ref Range    AST 16.0 0.0 - 33.9 U/L    ALT 8 (L) 10 - 49 U/L    Alkaline Phosphatase 101 46 - 116 U/L    Total Bilirubin 0.50 0.30 - 1.20 mg/dl    Total Protein 7.3 5.7 - 8.2 gm/dl    Albumin 3.5 3.4 - 5.0 gm/dl    Bilirubin, Direct 0.1 0.0 - 0.3 mg/dl   Lipase   Result Value Ref Range    Lipase 29 12 - 53 U/L   Microscopic Urinalysis   Result Value Ref Range    Squam Epithel, UA 10-14 /LPF    WBC, UA 5-9 /HPF    RBC, UA OCCASIONAL /HPF    BACTERIA, URINE OCCASIONAL /HPF   POCT Urinalysis no Micro   Result Value Ref Range    Glucose, Ur 100 (A) NEGATIVE,Negative mg/dl    Bilirubin, Urine Negative NEGATIVE,Negative      Ketones, Urine Negative NEGATIVE,Negative mg/dl    Specific Gravity, Urine 1.020 1.005 - 1.030      Blood, Urine Negative NEGATIVE,Negative      pH, Urine 6.0 5 - 9      Protein, Urine Negative NEGATIVE,Negative mg/dl    Urobilinogen, Urine 0.2 0.0 - 1.0 EU/dl    Nitrite, Urine Negative NEGATIVE,Negative      Leukocyte Esterase, Urine Small (A) NEGATIVE,Negative      Color, UA Yellow      Clarity, UA Clear       Imaging:    XR CHEST (2 VW)    Result Date: 03/07/2022  EXAM: XR CHEST (2 VW) INDICATION: cough  COMPARISON: None available. FINDINGS: SUPPORT DEVICES: None. LUNGS/PLEURA: No consolidation or pleural effusion. Mild left hemidiaphragm eventration noted. HEART/MEDIASTINUM: Cardiomediastinal silhouette is normal. OTHER: None.     IMPRESSION: No acute cardiopulmonary process. Electronically signed by: Patsy Lager, MD  03/07/2022 11:54 PM EST          Workstation ID: [id number]      EKG:  Cardiac Monitor Interpretation:     Imaging: 2 view chest x-ray no definite infiltrate seen as interpreted by Nelida Meuse PA-C    Procedure   Procedures    ED Course         RECORDS REVIEWED:  I reviewed the patient's previous records here at [facility] and available outside facilities and note that patient was seen outside facility earlier the April of this year for atypical chest pain    EXTERNAL RESULTS REVIEWED:      Severe exacerbation or progression of chronic illness: Acute undiagnosed problem    Threat to body function: pulmonary    SOCIAL DETERMINANTS  impacting Evaluation and Management:     Comorbidities impacting Evaluation and Management: Diabetes    Critical Care Time (if necessary)     Medications - No data to display    NARRATIVE:      Medical Decision Making   Lab work shows CBC with a normal white count no evidence of a leukocytosis or infection H&H were normal.  BMP showed a slightly elevated creatinine of 1.18 this was compared to prior values and looks like she is usually in the 1.3-1.5 range.  Glucose was mildly elevated at 146.  LFTs were within normal's lipase was normal urine showed 5-9 WBCs 10-14 epithelial cells with occasional RBC and bacteria this has been sent for culture.  Chest x-ray did not show any evidence of pneumonia COVID influenza RSV swab was negative.    Remained awake alert hemodynamically stable here.  At this time discussed most likely viral etiology will write for some Tessalon Perles rest encourage fluids discussed the urine culture is pending and if that returns showing infection we will need to call in a prescription for antibiotic states that she does not follow-up with her doctor if not improving in the next 3 to 4 days and to return at any time worsening or new concerns.      Final Diagnosis       ICD-10-CM    1. Acute cough  R05.1       2. Viral URI with cough  J06.9           Disposition    Patient discharged home in stable condition to follow-up as above.  Patient's chief complaint history physical treatment course and follow-up were discussed with Dr. Orvilla Cornwall who agrees with assessment and plan.    Fara Chute, PA-C   March 08, 2022    My signature above authenticates this document and my orders, the final    diagnosis (es), discharge prescription (s), and instructions in the Epic    record.  If you have any questions please contact [phone].     Nursing notes have been reviewed by the physician/ advanced practice    Clinician.                             Fara Chute, [place]  03/08/22 [phone]

## 2022-03-08 ENCOUNTER — Inpatient Hospital Stay
Admit: 2022-03-08 | Discharge: 2022-03-08 | Disposition: A | Discharge status: Home / Self Care | Payer: MEDICARE | Attending: Student in an Organized Health Care Education/Training Program

## 2022-03-08 DIAGNOSIS — R051 Acute cough: Secondary | ICD-10-CM

## 2022-03-08 LAB — CBC WITH AUTO DIFFERENTIAL
Basophils: 0.4 % (ref 0–3)
Eosinophils: 3.3 % (ref 0–5)
Hematocrit: 36.7 % (ref 35.0–47.0)
Hemoglobin: 11.6 gm/dl (ref 11.0–16.0)
Immature Granulocytes: 0.6 % (ref 0.0–3.0)
Lymphocytes: 11.4 % — ABNORMAL LOW (ref 28–48)
MCH: 28 pg (ref 25.4–34.6)
MCHC: 31.6 gm/dl (ref 30.0–36.0)
MCV: 88.6 fL (ref 80.0–98.0)
MPV: 11.5 fL — ABNORMAL HIGH (ref 6.0–10.0)
Monocytes: 8.8 % (ref 1–13)
Neutrophils Segmented: 75.5 % — ABNORMAL HIGH (ref 34–64)
Nucleated RBCs: 0 (ref 0–0)
RBC: 4.14 M/uL (ref 3.60–5.20)
RDW: 45.4 (ref 36.4–46.3)
WBC: 9.8 10*3/uL (ref 4.0–11.0)

## 2022-03-08 LAB — BASIC METABOLIC PANEL
Anion Gap: 9 mmol/L (ref 5–15)
BUN: 18 mg/dl (ref 9–23)
CO2: 22 mEq/L (ref 20–31)
Calcium: 8.9 mg/dl (ref 8.7–10.4)
Chloride: 108 mEq/L — ABNORMAL HIGH (ref 98–107)
Creatinine: 1.18 mg/dl — ABNORMAL HIGH (ref 0.55–1.02)
GFR African American: 56
GFR Non-African American: 46
Glucose: 146 mg/dl — ABNORMAL HIGH (ref 74–106)
Potassium: 4.1 mEq/L (ref 3.5–5.1)
Sodium: 139 mEq/L (ref 136–145)

## 2022-03-08 LAB — COVID-19, FLU A/B, AND RSV COMBO
Influenza A H1 (Seasonal) PCR: NEGATIVE
Influenza virus B RNA: NEGATIVE
RSV A/B PCR: NEGATIVE
SARS-CoV-2: NEGATIVE

## 2022-03-08 LAB — HEPATIC FUNCTION PANEL
ALT: 8 U/L — ABNORMAL LOW (ref 10–49)
AST: 16 U/L (ref 0.0–33.9)
Albumin: 3.5 gm/dl (ref 3.4–5.0)
Alkaline Phosphatase: 101 U/L (ref 46–116)
Bilirubin, Direct: 0.1 mg/dl (ref 0.0–0.3)
Total Bilirubin: 0.5 mg/dl (ref 0.30–1.20)
Total Protein: 7.3 gm/dl (ref 5.7–8.2)

## 2022-03-08 LAB — MICROSCOPIC URINALYSIS

## 2022-03-08 LAB — POCT URINALYSIS DIPSTICK
Bilirubin, Urine: NEGATIVE
Blood, Urine: NEGATIVE
Glucose, Ur: 100 mg/dl — AB
Ketones, Urine: NEGATIVE mg/dl
Nitrite, Urine: NEGATIVE
Protein, Urine: NEGATIVE mg/dl
Specific Gravity, Urine: 1.02 (ref 1.005–1.030)
Urobilinogen, Urine: 0.2 EU/dl (ref 0.0–1.0)
pH, Urine: 6 (ref 5–9)

## 2022-03-08 LAB — LIPASE: Lipase: 29 U/L (ref 12–53)

## 2022-03-08 MED ORDER — BENZONATATE 100 MG PO CAPS
100 MG | ORAL_CAPSULE | Freq: Three times a day (TID) | ORAL | 0 refills | Status: AC | PRN
Start: 2022-03-08 — End: 2022-03-13

## 2022-03-08 NOTE — Discharge Instructions (Addendum)
Rest and encourage fluids  Prescription for cough medicine sent to pharmacy on record  Urine has been sent for culture  Please return at any time increasing cough, shortness of breath fever vomiting or new concerns  Follow-up with your doctor if not improving in the next 3 to 4 days

## 2022-03-09 LAB — CULTURE, URINE: Culture Result: 40000 — AB

## 2022-03-18 ENCOUNTER — Ambulatory Visit: Payer: 59 | Admitting: Nurse Practitioner

## 2022-03-18 DIAGNOSIS — Z794 Long term (current) use of insulin: Secondary | ICD-10-CM

## 2022-03-18 DIAGNOSIS — I10 Essential (primary) hypertension: Secondary | ICD-10-CM

## 2022-03-18 DIAGNOSIS — E559 Vitamin D deficiency, unspecified: Secondary | ICD-10-CM

## 2022-03-18 DIAGNOSIS — E782 Mixed hyperlipidemia: Secondary | ICD-10-CM

## 2022-03-24 DIAGNOSIS — G301 Alzheimer's disease with late onset: Secondary | ICD-10-CM | POA: Diagnosis not present

## 2022-03-30 DIAGNOSIS — I10 Essential (primary) hypertension: Secondary | ICD-10-CM | POA: Diagnosis not present

## 2022-03-30 DIAGNOSIS — Z23 Encounter for immunization: Secondary | ICD-10-CM | POA: Diagnosis not present

## 2022-03-30 DIAGNOSIS — N184 Chronic kidney disease, stage 4 (severe): Secondary | ICD-10-CM | POA: Diagnosis not present

## 2022-03-30 DIAGNOSIS — E1142 Type 2 diabetes mellitus with diabetic polyneuropathy: Secondary | ICD-10-CM | POA: Diagnosis not present

## 2022-03-30 DIAGNOSIS — E785 Hyperlipidemia, unspecified: Secondary | ICD-10-CM | POA: Diagnosis not present

## 2022-03-30 DIAGNOSIS — G301 Alzheimer's disease with late onset: Secondary | ICD-10-CM | POA: Diagnosis not present

## 2022-04-01 DIAGNOSIS — G301 Alzheimer's disease with late onset: Secondary | ICD-10-CM | POA: Diagnosis not present

## 2022-04-13 DIAGNOSIS — E1140 Type 2 diabetes mellitus with diabetic neuropathy, unspecified: Secondary | ICD-10-CM | POA: Diagnosis not present

## 2022-04-13 DIAGNOSIS — M79672 Pain in left foot: Secondary | ICD-10-CM | POA: Diagnosis not present

## 2022-04-13 DIAGNOSIS — M79675 Pain in left toe(s): Secondary | ICD-10-CM | POA: Diagnosis not present

## 2022-04-13 DIAGNOSIS — I739 Peripheral vascular disease, unspecified: Secondary | ICD-10-CM | POA: Diagnosis not present

## 2022-04-13 DIAGNOSIS — M79674 Pain in right toe(s): Secondary | ICD-10-CM | POA: Diagnosis not present

## 2022-04-13 DIAGNOSIS — L110 Acquired keratosis follicularis: Secondary | ICD-10-CM | POA: Diagnosis not present

## 2022-04-13 DIAGNOSIS — L609 Nail disorder, unspecified: Secondary | ICD-10-CM | POA: Diagnosis not present

## 2022-04-13 DIAGNOSIS — M79671 Pain in right foot: Secondary | ICD-10-CM | POA: Diagnosis not present

## 2022-04-27 DIAGNOSIS — E785 Hyperlipidemia, unspecified: Secondary | ICD-10-CM | POA: Diagnosis not present

## 2022-04-27 DIAGNOSIS — I10 Essential (primary) hypertension: Secondary | ICD-10-CM | POA: Diagnosis not present

## 2022-04-27 DIAGNOSIS — Z0001 Encounter for general adult medical examination with abnormal findings: Secondary | ICD-10-CM | POA: Diagnosis not present

## 2022-04-27 DIAGNOSIS — N184 Chronic kidney disease, stage 4 (severe): Secondary | ICD-10-CM | POA: Diagnosis not present

## 2022-04-28 DIAGNOSIS — I10 Essential (primary) hypertension: Secondary | ICD-10-CM | POA: Diagnosis not present

## 2022-04-28 DIAGNOSIS — Z0001 Encounter for general adult medical examination with abnormal findings: Secondary | ICD-10-CM | POA: Diagnosis not present

## 2022-04-28 DIAGNOSIS — E785 Hyperlipidemia, unspecified: Secondary | ICD-10-CM | POA: Diagnosis not present

## 2022-04-28 DIAGNOSIS — N184 Chronic kidney disease, stage 4 (severe): Secondary | ICD-10-CM | POA: Diagnosis not present

## 2022-04-30 DIAGNOSIS — I10 Essential (primary) hypertension: Secondary | ICD-10-CM | POA: Diagnosis not present

## 2022-04-30 DIAGNOSIS — N184 Chronic kidney disease, stage 4 (severe): Secondary | ICD-10-CM | POA: Diagnosis not present

## 2022-05-02 DIAGNOSIS — G301 Alzheimer's disease with late onset: Secondary | ICD-10-CM | POA: Diagnosis not present

## 2022-05-03 DIAGNOSIS — I129 Hypertensive chronic kidney disease with stage 1 through stage 4 chronic kidney disease, or unspecified chronic kidney disease: Secondary | ICD-10-CM | POA: Diagnosis not present

## 2022-05-03 DIAGNOSIS — E876 Hypokalemia: Secondary | ICD-10-CM | POA: Diagnosis not present

## 2022-05-03 DIAGNOSIS — N189 Chronic kidney disease, unspecified: Secondary | ICD-10-CM | POA: Diagnosis not present

## 2022-05-03 DIAGNOSIS — D472 Monoclonal gammopathy: Secondary | ICD-10-CM | POA: Diagnosis not present

## 2022-05-03 DIAGNOSIS — E1122 Type 2 diabetes mellitus with diabetic chronic kidney disease: Secondary | ICD-10-CM | POA: Diagnosis not present

## 2022-05-04 ENCOUNTER — Encounter: Payer: Self-pay | Admitting: Nurse Practitioner

## 2022-05-04 ENCOUNTER — Ambulatory Visit (INDEPENDENT_AMBULATORY_CARE_PROVIDER_SITE_OTHER): Payer: 59 | Admitting: Nurse Practitioner

## 2022-05-04 VITALS — BP 112/71 | HR 82 | Ht 62.0 in | Wt 139.8 lb

## 2022-05-04 DIAGNOSIS — Z794 Long term (current) use of insulin: Secondary | ICD-10-CM

## 2022-05-04 DIAGNOSIS — I10 Essential (primary) hypertension: Secondary | ICD-10-CM

## 2022-05-04 DIAGNOSIS — N1832 Chronic kidney disease, stage 3b: Secondary | ICD-10-CM | POA: Diagnosis not present

## 2022-05-04 DIAGNOSIS — E782 Mixed hyperlipidemia: Secondary | ICD-10-CM | POA: Diagnosis not present

## 2022-05-04 DIAGNOSIS — E559 Vitamin D deficiency, unspecified: Secondary | ICD-10-CM | POA: Diagnosis not present

## 2022-05-04 DIAGNOSIS — E1122 Type 2 diabetes mellitus with diabetic chronic kidney disease: Secondary | ICD-10-CM

## 2022-05-04 LAB — POCT GLYCOSYLATED HEMOGLOBIN (HGB A1C): Hemoglobin A1C: 7.4 % — AB (ref 4.0–5.6)

## 2022-05-04 MED ORDER — TRADJENTA 5 MG PO TABS: 5.0000 mg | ORAL_TABLET | Freq: Every day | ORAL | 3 refills | Status: DC

## 2022-05-04 NOTE — Progress Notes (Signed)
Endocrinology follow-up  Note       05/04/2022, 10:16 AM   Subjective:    Patient ID: Amy Stein, female    DOB: 31-Aug-1933.  Amy Stein is being seen in follow-up for management of currently uncontrolled symptomatic type 2 diabetes, hypertension, hyperlipidemia. PMD:   Carrolyn Meiers, MD.   Past Medical History:  Diagnosis Date   Dementia [facility])    Diabetes mellitus    Hypertension    Shingles    on both legs 1/18   Past Surgical History:  Procedure Laterality Date   COLONOSCOPY     COLONOSCOPY N/A 02/23/2017   Procedure: COLONOSCOPY;  Surgeon: Daneil Dolin, MD;  Location: [facility];  Service: Endoscopy;  Laterality: N/A;  9:30 Am   POLYPECTOMY  02/23/2017   Procedure: POLYPECTOMY;  Surgeon: Daneil Dolin, MD;  Location: [facility];  Service: Endoscopy;;  cecum   SHOULDER SURGERY Left    August 2022   Social History   Socioeconomic History   Marital status: Single    Spouse name: Not on file   Number of children: Not on file   Years of education: Not on file   Highest education level: Not on file  Occupational History   Not on file  Tobacco Use   Smoking status: Former   Smokeless tobacco: Never  Vaping Use   Vaping Use: Never used  Substance and Sexual Activity   Alcohol use: No   Drug use: No   Sexual activity: Yes    Birth control/protection: Post-menopausal  Other Topics Concern   Not on file  Social History Narrative   Not on file   Social Determinants of Health   Financial Resource Strain: Low Risk  (12/16/2021)   Overall Financial Resource Strain (CARDIA)    Difficulty of Paying Living Expenses: Not hard at all  Food Insecurity: No Food Insecurity (12/16/2021)   Hunger Vital Sign    Worried About Running Out of Food in the Last Year: Never true    Ran Out of Food in the Last Year: Never true  Transportation Needs: No Transportation Needs (12/16/2021)    PRAPARE - Hydrologist (Medical): No    Lack of Transportation (Non-Medical): No  Physical Activity: Insufficiently Active (12/16/2021)   Exercise Vital Sign    Days of Exercise per Week: 7 days    Minutes of Exercise per Session: 10 min  Stress: No Stress Concern Present (12/16/2021)   [place]    Feeling of Stress : Not at all  Social Connections: Unknown (12/16/2021)   Social Connection and Isolation Panel [NHANES]    Frequency of Communication with Friends and Family: More than three times a week    Frequency of Social Gatherings with Friends and Family: Three times a week    Attends Religious Services: Patient refused    Active Member of Clubs or Organizations: No    Attends Archivist Meetings: Patient refused    Marital Status: Patient refused   Outpatient Encounter Medications as of 05/04/2022  Medication Sig   Acetaminophen (TYLENOL PO) Take by  mouth.   amLODipine (NORVASC) 5 MG tablet Take 5 mg by mouth daily.   aspirin EC 81 MG tablet Take 81 mg by mouth daily. Swallow whole.   ferrous sulfate 325 (65 FE) MG tablet Take 1 tablet (325 mg total) by mouth 2 (two) times daily with a meal. (Patient taking differently: Take 325 mg by mouth 2 (two) times daily with a meal. It is reported that hs ei staking once a day.)   gabapentin (NEURONTIN) 400 MG capsule Take 400 mg by mouth 3 (three) times daily. Patient is taking two a day   HM VITAMIN D3 100 MCG (4000 UT) CAPS TAKE ONE CAPSULE (5000 UNITS TOTAL) BY MOUTH DAILY   hydrochlorothiazide (MICROZIDE) 12.5 MG capsule Take 12.5 mg by mouth daily.   losartan (COZAAR) 100 MG tablet Take 100 mg by mouth daily.   omeprazole (PRILOSEC) 20 MG capsule Take 20 mg by mouth daily.    oxybutynin (DITROPAN-XL) 10 MG 24 hr tablet Take 10 mg by mouth daily.   simvastatin (ZOCOR) 40 MG tablet Take 40 mg by mouth at bedtime.    [DISCONTINUED]  TRADJENTA 5 MG TABS tablet Take 1 tablet (5 mg total) by mouth daily.   ACCU-CHEK GUIDE test strip USE TO TEST THREE TIMESCDAILY. (Patient not taking: Reported on 12/17/2021)   Accu-Chek Softclix Lancets lancets daily. (Patient not taking: Reported on 12/16/2021)   Blood Glucose Monitoring Suppl (ACCU-CHEK GUIDE) w/Device KIT  (Patient not taking: Reported on 12/16/2021)   fluticasone (FLONASE) 50 MCG/ACT nasal spray Place 1 spray into both nostrils daily for 14 days.   hydrocortisone 2.5 % cream Apply topically 2 (two) times daily.   TRADJENTA 5 MG TABS tablet Take 1 tablet (5 mg total) by mouth daily.   UNABLE TO FIND Cream for feet-daily (Patient not taking: Reported on 05/04/2022)   [DISCONTINUED] insulin glargine (LANTUS SOLOSTAR) 100 UNIT/ML Solostar Pen Inject 5 Units into the skin at bedtime. (Patient not taking: Reported on 05/04/2022)   [DISCONTINUED] Insulin Pen Needle (PEN NEEDLES) 32G X 4 MM MISC 1 each by Does not apply route at bedtime. (Patient not taking: Reported on 05/04/2022)   [DISCONTINUED] labetalol (NORMODYNE) 100 MG tablet Take 100 mg by mouth 2 (two) times daily. (Patient not taking: Reported on 05/04/2022)   [DISCONTINUED] loratadine (CLARITIN) 10 MG tablet Take 10 mg by mouth daily. (Patient not taking: Reported on 05/04/2022)   [DISCONTINUED] vitamin B-12 1000 MCG tablet Take 1 tablet (1,000 mcg total) by mouth daily. (Patient not taking: Reported on 05/04/2022)   No facility-administered encounter medications on file as of 05/04/2022.    ALLERGIES: Allergies  Allergen Reactions   Sulfa Antibiotics    Sulfonamide Derivatives Rash    VACCINATION STATUS:  There is no immunization history on file for this patient.  Diabetes She presents for her follow-up diabetic visit. She has type 2 diabetes mellitus. Onset time: She was diagnosed at approximate age of 51 years. Her disease course has been stable. There are no hypoglycemic associated symptoms. Pertinent negatives for  hypoglycemia include no confusion, headaches, pallor or seizures. There are no diabetic associated symptoms. Pertinent negatives for diabetes include no chest pain, no polydipsia, no polyphagia and no polyuria. There are no hypoglycemic complications. Symptoms are stable. Diabetic complications include nephropathy. Risk factors for coronary artery disease include diabetes mellitus, hypertension, sedentary lifestyle, tobacco exposure, dyslipidemia and post-menopausal. Current diabetic treatment includes oral agent (monotherapy). She is compliant with treatment all of the time. Her weight is decreasing steadily. She  is following a generally unhealthy diet. When asked about meal planning, she reported none. She has not had a previous visit with a dietitian. She rarely participates in exercise. Her home blood glucose trend is fluctuating minimally. (She presents today, accompanied by her aide, with no logs or meter (was given a break from routine monitoring due to discontinuation of insulin LV).  Her POCT A1c today is 7.4%, increasing slightly from last visit of 7.1%.  This is an ACCEPTABLE target for her given her age and comorbidities.  Her aide was discussing the need for DM foot wear.  She sees podiatry routinely but they told her that they no longer prescribe footwear.) An ACE inhibitor/angiotensin II receptor blocker is not being taken (was taken off by nephrologist). She does not see a podiatrist.Eye exam is current.  Hypertension This is a chronic problem. The current episode started more than 1 year ago. The problem has been gradually improving since onset. The problem is controlled. Pertinent negatives include no chest pain, headaches, palpitations or shortness of breath. There are no associated agents to hypertension. Risk factors for coronary artery disease include diabetes mellitus, sedentary lifestyle, smoking/tobacco exposure, post-menopausal state and dyslipidemia. Past treatments include diuretics,  calcium channel blockers and beta blockers. The current treatment provides moderate improvement. Compliance problems include exercise.  Hypertensive end-organ damage includes kidney disease. Identifiable causes of hypertension include chronic renal disease.     Review of systems  Constitutional: + Minimally fluctuating body weight,  current Body mass index is 25.57 kg/m. , no fatigue, no subjective hyperthermia, no subjective hypothermia, apparent memory issues Eyes: no blurry vision, no xerophthalmia ENT: no sore throat, no nodules palpated in throat, no dysphagia/odynophagia, no hoarseness Cardiovascular: no chest pain, no shortness of breath, no palpitations, no leg swelling Respiratory: no cough, no shortness of breath Gastrointestinal: no nausea/vomiting/diarrhea Musculoskeletal: no muscle/joint aches, walks with cane  Skin: no rashes, no hyperemia Neurological: no tremors, no numbness, no tingling, no dizziness Psychiatric: no depression, no anxiety    Objective:    BP 112/71 (BP Location: Left Arm, Patient Position: Sitting, Cuff Size: Normal)   Pulse 82   Ht '5\' 2"'$  (1.575 m)   Wt 139 lb 12.8 oz (63.4 kg)   BMI 25.57 kg/m   Wt Readings from Last 3 Encounters:  05/04/22 139 lb 12.8 oz (63.4 kg)  12/17/21 140 lb 6.4 oz (63.7 kg)  12/16/21 142 lb 8 oz (64.6 kg)    BP Readings from Last 3 Encounters:  05/04/22 112/71  12/17/21 114/66  12/16/21 (!) 143/83     Physical Exam- Limited  Constitutional:  Body mass index is 25.57 kg/m. , not in acute distress, Memory impairment Eyes:  EOMI, no exophthalmos Neck: Supple Musculoskeletal: no gross deformities, strength intact in all four extremities, no gross restriction of joint movements, walks with cane Skin:  no rashes, no hyperemia Neurological: no tremor with outstretched hands      CMP     Component Value Date/Time   NA 137 08/18/2021 1609   NA 143 04/24/2020 1044   K 3.9 08/18/2021 1609   CL 105 08/18/2021  1609   CO2 24 08/18/2021 1609   GLUCOSE 107 (H) 08/18/2021 1609   BUN 23 08/18/2021 1609   BUN 21 04/24/2020 1044   CREATININE 1.33 (H) 08/18/2021 1609   CREATININE 2.00 (H) 09/18/2019 0848   CALCIUM 9.2 08/18/2021 1609   PROT 6.8 08/18/2021 1609   PROT 7.1 04/24/2020 1044   ALBUMIN 3.6 08/18/2021 1609  ALBUMIN 4.1 04/24/2020 1044   AST 20 08/18/2021 1609   ALT 14 08/18/2021 1609   ALKPHOS 78 08/18/2021 1609   BILITOT 0.6 08/18/2021 1609   BILITOT 0.2 04/24/2020 1044   GFRNONAA 39 (L) 08/18/2021 1609   GFRNONAA 23 (L) 09/18/2019 0848   GFRAA 42 (L) 04/24/2020 1044   GFRAA 26 (L) 09/18/2019 0848    Diabetic Labs (most recent): Lab Results  Component Value Date   HGBA1C 7.4 (A) 05/04/2022   HGBA1C 7.1 (A) 12/17/2021   HGBA1C 7.6 (A) 08/12/2021   MICROALBUR 2.6 09/18/2019   MICROALBUR 0.8 02/19/2019   MICROALBUR 0.6 01/31/2018      Lab Results  Component Value Date   TSH 0.936 07/01/2021   TSH 1.68 09/18/2019   TSH 1.29 01/31/2018   TSH 0.793 05/26/2017   FREET4 0.75 07/01/2021   FREET4 0.9 09/18/2019   FREET4 0.9 01/31/2018   Lipid Panel     Component Value Date/Time   CHOL 148 07/01/2021 1241   TRIG 99 07/01/2021 1241   HDL 42 07/01/2021 1241   CHOLHDL 3.5 07/01/2021 1241   VLDL 20 07/01/2021 1241   LDLCALC 86 07/01/2021 1241   LDLCALC 67 09/18/2019 0848      Assessment & Plan:   1) Type 2 diabetes mellitus with stage 3 chronic kidney disease, with long-term current use of insulin (Amy Stein)  - Amy Stein has currently uncontrolled symptomatic type 2 DM since 89 years of age.     She presents today, accompanied by her aide, with no logs or meter (was given a break from routine monitoring due to discontinuation of insulin LV).  Her POCT A1c today is 7.4%, increasing slightly from last visit of 7.1%.  This is an ACCEPTABLE target for her given her age and comorbidities.  Her aide was discussing the need for DM foot wear.  She sees podiatry routinely but  they told her that they no longer prescribe footwear.  - Recent labs reviewed.  -her diabetes is complicated by stage 4 renal insufficiency and Amy Stein at a high risk for more acute and chronic complications which include CAD, CVA, CKD, retinopathy, and neuropathy. These are all discussed in detail with the patient.  - Nutritional counseling repeated at each appointment due to patients tendency to fall back in to old habits.  - The patient admits there is a room for improvement in their diet and drink choices. -  Suggestion is made for the patient to avoid simple carbohydrates from their diet including Cakes, Sweet Desserts / Pastries, Ice Cream, Soda (diet and regular), Sweet Tea, Candies, Chips, Cookies, Sweet Pastries, Store Bought Juices, Alcohol in Excess of 1-2 drinks a day, Artificial Sweeteners, Coffee Creamer, and "Sugar-free" Products. This will help patient to have stable blood glucose profile and potentially avoid unintended weight gain.   - I encouraged the patient to switch to unprocessed or minimally processed complex starch and increased protein intake (animal or plant source), fruits, and vegetables.   - Patient is advised to stick to a routine mealtimes to eat 3 meals a day and avoid unnecessary snacks (to snack only to correct hypoglycemia).  - I have approached her with the following individualized plan to manage diabetes and patient agrees:   -She is elderly patient with several comorbidities,  #1 priority and treating her diabetes would be to avoid hypoglycemia.    -Given her stable, at goal A1c, no changed will be made to her medications today.  She is  advised to continue Tradjenta 5 mg po daily.  -She can officially take a break from glucose monitoring at this time due to safe medication regimen.  -She is not a candidate for metformin, SGLT2 inhibitors due to CKD.  -- Patient specific target  A1c;  LDL, HDL, Triglycerides, and  Waist Circumference were  discussed in detail.  2) BP/HTN:  Her blood pressure is controlled to target.  She is advised to continue Amlodipine 5 mg po daily, HCTZ 12.5 mg po daily and Losartan 100 mg po daily.  Will defer med changes to nephrologist.  3) Lipids/HPL:    Her most recent lipid panel from 07/01/21 shows controlled LDL of 86.  She is advised to continue Simvastatin 40 mg po daily at bedtime.  Side effects and precautions discussed with her.    4)  Weight/Diet:  Her Body mass index is 25.57 kg/m.-not a candidate for weight loss.  CDE Consult will be initiated, exercise, and detailed carbohydrates information provided.  5) Vitamin D deficiency- -Her most recent vitamin D level was 103 on 07/01/21.  She is advised to stop all vitamin D supplementation at this time.   6) Chronic Care/Health Maintenance: -she is on ARB and Statin medications and is encouraged to initiate and continue to follow up with Ophthalmology, Dentist, Podiatrist at least yearly or according to recommendations, and advised to  stay away from smoking. I have recommended yearly flu vaccine and pneumonia vaccine at least every 5 years; moderate intensity exercise for up to 150 minutes weekly; and  sleep for at least 7 hours a day.  - I advised patient to maintain close follow up with Carrolyn Meiers, MD for primary care needs.      I spent 30 minutes in the care of the patient today including review of labs from [place], Lipids, Thyroid Function, Hematology (current and previous including abstractions from other facilities); face-to-face time discussing  her blood glucose readings/logs, discussing hypoglycemia and hyperglycemia episodes and symptoms, medications doses, her options of short and long term treatment based on the latest standards of care / guidelines;  discussion about incorporating lifestyle medicine;  and documenting the encounter. Risk reduction counseling performed per USPSTF guidelines to reduce obesity and cardiovascular  risk factors.     Please refer to Patient Instructions for Blood Glucose Monitoring and Insulin/Medications Dosing Guide"  in media tab for additional information. Please  also refer to " Patient Self Inventory" in the Media  tab for reviewed elements of pertinent patient history.  Amy Stein participated in the discussions, expressed understanding, and voiced agreement with the above plans.  All questions were answered to her satisfaction. she is encouraged to contact clinic should she have any questions or concerns prior to her return visit.   Follow up plan: - Return in about 6 months (around 11/02/2022) for Diabetes F/U with A1c in office, No previsit labs.   Rayetta Pigg, [facility] [address] [place], [place] [address] Phone: [phone] Fax: [phone]  05/04/2022, 10:16 AM

## 2022-05-31 DIAGNOSIS — G301 Alzheimer's disease with late onset: Secondary | ICD-10-CM | POA: Diagnosis not present

## 2022-06-09 DIAGNOSIS — N184 Chronic kidney disease, stage 4 (severe): Secondary | ICD-10-CM | POA: Diagnosis not present

## 2022-06-09 DIAGNOSIS — R269 Unspecified abnormalities of gait and mobility: Secondary | ICD-10-CM | POA: Diagnosis not present

## 2022-06-09 DIAGNOSIS — E785 Hyperlipidemia, unspecified: Secondary | ICD-10-CM | POA: Diagnosis not present

## 2022-06-09 DIAGNOSIS — G3184 Mild cognitive impairment, so stated: Secondary | ICD-10-CM | POA: Diagnosis not present

## 2022-06-09 DIAGNOSIS — I10 Essential (primary) hypertension: Secondary | ICD-10-CM | POA: Diagnosis not present

## 2022-06-09 DIAGNOSIS — E1142 Type 2 diabetes mellitus with diabetic polyneuropathy: Secondary | ICD-10-CM | POA: Diagnosis not present

## 2022-06-09 DIAGNOSIS — Z1389 Encounter for screening for other disorder: Secondary | ICD-10-CM | POA: Diagnosis not present

## 2022-06-09 DIAGNOSIS — Z0001 Encounter for general adult medical examination with abnormal findings: Secondary | ICD-10-CM | POA: Diagnosis not present

## 2022-06-14 DIAGNOSIS — Z0001 Encounter for general adult medical examination with abnormal findings: Secondary | ICD-10-CM | POA: Diagnosis not present

## 2022-06-14 DIAGNOSIS — E1142 Type 2 diabetes mellitus with diabetic polyneuropathy: Secondary | ICD-10-CM | POA: Diagnosis not present

## 2022-06-14 DIAGNOSIS — E785 Hyperlipidemia, unspecified: Secondary | ICD-10-CM | POA: Diagnosis not present

## 2022-06-14 DIAGNOSIS — N184 Chronic kidney disease, stage 4 (severe): Secondary | ICD-10-CM | POA: Diagnosis not present

## 2022-06-14 DIAGNOSIS — I10 Essential (primary) hypertension: Secondary | ICD-10-CM | POA: Diagnosis not present

## 2022-06-29 DIAGNOSIS — G301 Alzheimer's disease with late onset: Secondary | ICD-10-CM | POA: Diagnosis not present

## 2022-07-08 DIAGNOSIS — N184 Chronic kidney disease, stage 4 (severe): Secondary | ICD-10-CM | POA: Diagnosis not present

## 2022-07-08 DIAGNOSIS — I10 Essential (primary) hypertension: Secondary | ICD-10-CM | POA: Diagnosis not present

## 2022-07-13 ENCOUNTER — Other Ambulatory Visit (HOSPITAL_COMMUNITY)
Admission: RE | Admit: 2022-07-13 | Discharge: 2022-07-13 | Disposition: A | Discharge status: Home / Self Care | Payer: 59 | Source: Ambulatory Visit | Attending: Obstetrics & Gynecology | Admitting: Obstetrics & Gynecology

## 2022-07-13 ENCOUNTER — Ambulatory Visit (INDEPENDENT_AMBULATORY_CARE_PROVIDER_SITE_OTHER): Payer: 59 | Admitting: Obstetrics & Gynecology

## 2022-07-13 ENCOUNTER — Encounter: Payer: Self-pay | Admitting: Obstetrics & Gynecology

## 2022-07-13 VITALS — BP 108/65 | HR 82 | Wt 141.0 lb

## 2022-07-13 DIAGNOSIS — Z113 Encounter for screening for infections with a predominantly sexual mode of transmission: Secondary | ICD-10-CM

## 2022-07-13 DIAGNOSIS — N3941 Urge incontinence: Secondary | ICD-10-CM

## 2022-07-13 DIAGNOSIS — N3281 Overactive bladder: Secondary | ICD-10-CM | POA: Diagnosis not present

## 2022-07-13 DIAGNOSIS — R32 Unspecified urinary incontinence: Secondary | ICD-10-CM | POA: Diagnosis present

## 2022-07-13 MED ORDER — SOLIFENACIN SUCCINATE 10 MG PO TABS: 10.0000 mg | ORAL_TABLET | Freq: Every day | ORAL | 11 refills | Status: DC

## 2022-07-13 NOTE — Addendum Note (Signed)
Addended by: Octaviano Glow on: 07/13/2022 05:11 PM   Modules accepted: Orders

## 2022-07-13 NOTE — Progress Notes (Signed)
Chief Complaint  Patient presents with   Urinary Frequency    Taking Cipro   lower abdominal pain      89 y.o. G4P4 No LMP recorded. Patient is postmenopausal. The current method of family planning is post menopausal status.  Outpatient Encounter Medications as of 07/13/2022  Medication Sig   Acetaminophen (TYLENOL PO) Take by mouth.   amLODipine (NORVASC) 5 MG tablet Take 5 mg by mouth daily.   aspirin EC 81 MG tablet Take 81 mg by mouth daily. Swallow whole.   ciprofloxacin (CIPRO) 250 MG tablet SMARTSIG:1 Tablet(s) By Mouth Every 12 Hours   ferrous sulfate 325 (65 FE) MG tablet Take 1 tablet (325 mg total) by mouth 2 (two) times daily with a meal. (Patient taking differently: Take 325 mg by mouth 2 (two) times daily with a meal. It is reported that hs ei staking once a day.)   gabapentin (NEURONTIN) 400 MG capsule Take 400 mg by mouth 3 (three) times daily. Patient is taking two a day   HM VITAMIN D3 100 MCG (4000 UT) CAPS TAKE ONE CAPSULE (5000 UNITS TOTAL) BY MOUTH DAILY   losartan (COZAAR) 100 MG tablet Take 100 mg by mouth daily.   simvastatin (ZOCOR) 40 MG tablet Take 40 mg by mouth at bedtime.    solifenacin (VESICARE) 10 MG tablet Take 1 tablet (10 mg total) by mouth daily.   TRADJENTA 5 MG TABS tablet Take 1 tablet (5 mg total) by mouth daily.   [DISCONTINUED] oxybutynin (DITROPAN-XL) 10 MG 24 hr tablet Take 10 mg by mouth daily.   ACCU-CHEK GUIDE test strip USE TO TEST THREE TIMESCDAILY. (Patient not taking: Reported on 12/17/2021)   Accu-Chek Softclix Lancets lancets daily. (Patient not taking: Reported on 12/16/2021)   ALLERGY RELIEF 10 MG tablet Take 10 mg by mouth at bedtime. (Patient not taking: Reported on 07/13/2022)   Blood Glucose Monitoring Suppl (ACCU-CHEK GUIDE) w/Device KIT  (Patient not taking: Reported on 12/16/2021)   fluticasone (FLONASE) 50 MCG/ACT nasal spray Place 1 spray into both nostrils daily for 14 days.   hydrochlorothiazide (MICROZIDE) 12.5 MG  capsule Take 12.5 mg by mouth daily.   hydrocortisone 2.5 % cream Apply topically 2 (two) times daily.   omeprazole (PRILOSEC) 20 MG capsule Take 20 mg by mouth daily.    UNABLE TO FIND Cream for feet-daily (Patient not taking: Reported on 05/04/2022)   No facility-administered encounter medications on file as of 07/13/2022.    Subjective Pt with severe detrussor instability with incontinence on ditropan xl 10 Also with some concern of sexual activity No complaints otherwise Swab done Past Medical History:  Diagnosis Date   Dementia    Diabetes mellitus    Hypertension    Shingles    on both legs 1/18    Past Surgical History:  Procedure Laterality Date   COLONOSCOPY     COLONOSCOPY N/A 02/23/2017   Procedure: COLONOSCOPY;  Surgeon: Daneil Dolin, MD;  Location: [facility];  Service: Endoscopy;  Laterality: N/A;  9:30 Am   POLYPECTOMY  02/23/2017   Procedure: POLYPECTOMY;  Surgeon: Daneil Dolin, MD;  Location: [facility];  Service: Endoscopy;;  cecum   SHOULDER SURGERY Left    August 2022    OB History     Gravida  4   Para  4   Term      Preterm      AB      Living  4  SAB      IAB      Ectopic      Multiple      Live Births              Allergies  Allergen Reactions   Sulfa Antibiotics    Sulfonamide Derivatives Rash    Social History   Socioeconomic History   Marital status: Single    Spouse name: Not on file   Number of children: Not on file   Years of education: Not on file   Highest education level: Not on file  Occupational History   Not on file  Tobacco Use   Smoking status: Former   Smokeless tobacco: Never  Vaping Use   Vaping Use: Never used  Substance and Sexual Activity   Alcohol use: No   Drug use: No   Sexual activity: Yes    Birth control/protection: Post-menopausal  Other Topics Concern   Not on file  Social History Narrative   Not on file   Social Determinants of Health   Financial Resource  Strain: Low Risk  (12/16/2021)   Overall Financial Resource Strain (CARDIA)    Difficulty of Paying Living Expenses: Not hard at all  Food Insecurity: No Food Insecurity (12/16/2021)   Hunger Vital Sign    Worried About Running Out of Food in the Last Year: Never true    [place] in the Last Year: Never true  Transportation Needs: No Transportation Needs (12/16/2021)   PRAPARE - Hydrologist (Medical): No    Lack of Transportation (Non-Medical): No  Physical Activity: Insufficiently Active (12/16/2021)   Exercise Vital Sign    Days of Exercise per Week: 7 days    Minutes of Exercise per Session: 10 min  Stress: No Stress Concern Present (12/16/2021)   [place]    Feeling of Stress : Not at all  Social Connections: Unknown (12/16/2021)   Social Connection and Isolation Panel [NHANES]    Frequency of Communication with Friends and Family: More than three times a week    Frequency of Social Gatherings with Friends and Family: Three times a week    Attends Religious Services: Patient declined    Active Member of Clubs or Organizations: No    Attends Music therapist: Patient declined    Marital Status: Patient declined    Family History  Problem Relation Age of Onset   Throat cancer Father    Pneumonia Mother    Pancreatic cancer Brother     Medications:       Current Outpatient Medications:    Acetaminophen (TYLENOL PO), Take by mouth., Disp: , Rfl:    amLODipine (NORVASC) 5 MG tablet, Take 5 mg by mouth daily., Disp: , Rfl:    aspirin EC 81 MG tablet, Take 81 mg by mouth daily. Swallow whole., Disp: , Rfl:    ciprofloxacin (CIPRO) 250 MG tablet, SMARTSIG:1 Tablet(s) By Mouth Every 12 Hours, Disp: , Rfl:    ferrous sulfate 325 (65 FE) MG tablet, Take 1 tablet (325 mg total) by mouth 2 (two) times daily with a meal. (Patient taking differently: Take 325 mg by mouth 2 (two)  times daily with a meal. It is reported that hs ei staking once a day.), Disp: 60 tablet, Rfl: 0   gabapentin (NEURONTIN) 400 MG capsule, Take 400 mg by mouth 3 (three) times daily. Patient is taking two a day, Disp: ,  Rfl:    HM VITAMIN D3 100 MCG (4000 UT) CAPS, TAKE ONE CAPSULE (5000 UNITS TOTAL) BY MOUTH DAILY, Disp: 100 capsule, Rfl: 0   losartan (COZAAR) 100 MG tablet, Take 100 mg by mouth daily., Disp: , Rfl:    simvastatin (ZOCOR) 40 MG tablet, Take 40 mg by mouth at bedtime. , Disp: , Rfl:    solifenacin (VESICARE) 10 MG tablet, Take 1 tablet (10 mg total) by mouth daily., Disp: 30 tablet, Rfl: 11   TRADJENTA 5 MG TABS tablet, Take 1 tablet (5 mg total) by mouth daily., Disp: 90 tablet, Rfl: 3   ACCU-CHEK GUIDE test strip, USE TO TEST THREE TIMESCDAILY. (Patient not taking: Reported on 12/17/2021), Disp: , Rfl:    Accu-Chek Softclix Lancets lancets, daily. (Patient not taking: Reported on 12/16/2021), Disp: , Rfl:    ALLERGY RELIEF 10 MG tablet, Take 10 mg by mouth at bedtime. (Patient not taking: Reported on 07/13/2022), Disp: , Rfl:    Blood Glucose Monitoring Suppl (ACCU-CHEK GUIDE) w/Device KIT, , Disp: , Rfl:    fluticasone (FLONASE) 50 MCG/ACT nasal spray, Place 1 spray into both nostrils daily for 14 days., Disp: 16 g, Rfl: 0   hydrochlorothiazide (MICROZIDE) 12.5 MG capsule, Take 12.5 mg by mouth daily., Disp: , Rfl:    hydrocortisone 2.5 % cream, Apply topically 2 (two) times daily., Disp: , Rfl:    omeprazole (PRILOSEC) 20 MG capsule, Take 20 mg by mouth daily. , Disp: , Rfl:    UNABLE TO FIND, Cream for feet-daily (Patient not taking: Reported on 05/04/2022), Disp: , Rfl:   Objective Blood pressure 108/65, pulse 82, weight 141 lb (64 kg).  Severe atrophy can't even tell if her cervix is there or not Bimanual is negative exam otherwise normal   Pertinent ROS No burning with urination, frequency or urgency No nausea, vomiting or diarrhea Nor fever chills or other constitutional  symptoms   Labs or studies     Impression + Management Plan: Diagnoses this Encounter::   ICD-10-CM   1. Urge incontinence  N39.41    switch to vesicare 10 qhs    2. Detrusor instability  N32.81         Medications prescribed during  this encounter: Meds ordered this encounter  Medications   solifenacin (VESICARE) 10 MG tablet    Sig: Take 1 tablet (10 mg total) by mouth daily.    Dispense:  30 tablet    Refill:  11    Labs or Scans Ordered during this encounter: No orders of the defined types were placed in this encounter.     Follow up No follow-ups on file.

## 2022-07-15 LAB — CERVICOVAGINAL ANCILLARY ONLY
Chlamydia: NEGATIVE
Comment: NEGATIVE
Comment: NEGATIVE
Comment: NORMAL
Neisseria Gonorrhea: NEGATIVE
Trichomonas: NEGATIVE

## 2022-07-29 DIAGNOSIS — L110 Acquired keratosis follicularis: Secondary | ICD-10-CM | POA: Diagnosis not present

## 2022-07-29 DIAGNOSIS — E1140 Type 2 diabetes mellitus with diabetic neuropathy, unspecified: Secondary | ICD-10-CM | POA: Diagnosis not present

## 2022-07-29 DIAGNOSIS — L609 Nail disorder, unspecified: Secondary | ICD-10-CM | POA: Diagnosis not present

## 2022-07-29 DIAGNOSIS — M79672 Pain in left foot: Secondary | ICD-10-CM | POA: Diagnosis not present

## 2022-07-29 DIAGNOSIS — M79671 Pain in right foot: Secondary | ICD-10-CM | POA: Diagnosis not present

## 2022-07-29 DIAGNOSIS — M79674 Pain in right toe(s): Secondary | ICD-10-CM | POA: Diagnosis not present

## 2022-07-29 DIAGNOSIS — M79675 Pain in left toe(s): Secondary | ICD-10-CM | POA: Diagnosis not present

## 2022-07-29 DIAGNOSIS — I739 Peripheral vascular disease, unspecified: Secondary | ICD-10-CM | POA: Diagnosis not present

## 2022-08-03 DIAGNOSIS — I129 Hypertensive chronic kidney disease with stage 1 through stage 4 chronic kidney disease, or unspecified chronic kidney disease: Secondary | ICD-10-CM | POA: Diagnosis not present

## 2022-08-03 DIAGNOSIS — N189 Chronic kidney disease, unspecified: Secondary | ICD-10-CM | POA: Diagnosis not present

## 2022-08-03 DIAGNOSIS — E876 Hypokalemia: Secondary | ICD-10-CM | POA: Diagnosis not present

## 2022-08-03 DIAGNOSIS — E1122 Type 2 diabetes mellitus with diabetic chronic kidney disease: Secondary | ICD-10-CM | POA: Diagnosis not present

## 2022-08-03 DIAGNOSIS — D472 Monoclonal gammopathy: Secondary | ICD-10-CM | POA: Diagnosis not present

## 2022-08-06 DIAGNOSIS — I129 Hypertensive chronic kidney disease with stage 1 through stage 4 chronic kidney disease, or unspecified chronic kidney disease: Secondary | ICD-10-CM | POA: Diagnosis not present

## 2022-08-06 DIAGNOSIS — E1169 Type 2 diabetes mellitus with other specified complication: Secondary | ICD-10-CM | POA: Diagnosis not present

## 2022-08-06 DIAGNOSIS — E1122 Type 2 diabetes mellitus with diabetic chronic kidney disease: Secondary | ICD-10-CM | POA: Diagnosis not present

## 2022-08-06 DIAGNOSIS — E1142 Type 2 diabetes mellitus with diabetic polyneuropathy: Secondary | ICD-10-CM | POA: Diagnosis not present

## 2022-08-06 DIAGNOSIS — E785 Hyperlipidemia, unspecified: Secondary | ICD-10-CM | POA: Diagnosis not present

## 2022-08-06 DIAGNOSIS — G4700 Insomnia, unspecified: Secondary | ICD-10-CM | POA: Diagnosis not present

## 2022-08-06 DIAGNOSIS — J302 Other seasonal allergic rhinitis: Secondary | ICD-10-CM | POA: Diagnosis not present

## 2022-08-06 DIAGNOSIS — Z1159 Encounter for screening for other viral diseases: Secondary | ICD-10-CM | POA: Diagnosis not present

## 2022-08-06 DIAGNOSIS — Z79899 Other long term (current) drug therapy: Secondary | ICD-10-CM | POA: Diagnosis not present

## 2022-08-06 DIAGNOSIS — E559 Vitamin D deficiency, unspecified: Secondary | ICD-10-CM | POA: Diagnosis not present

## 2022-08-10 DIAGNOSIS — N189 Chronic kidney disease, unspecified: Secondary | ICD-10-CM | POA: Diagnosis not present

## 2022-08-10 DIAGNOSIS — I129 Hypertensive chronic kidney disease with stage 1 through stage 4 chronic kidney disease, or unspecified chronic kidney disease: Secondary | ICD-10-CM | POA: Diagnosis not present

## 2022-08-10 DIAGNOSIS — E1122 Type 2 diabetes mellitus with diabetic chronic kidney disease: Secondary | ICD-10-CM | POA: Diagnosis not present

## 2022-08-19 ENCOUNTER — Telehealth: Payer: Self-pay

## 2022-08-19 NOTE — Telephone Encounter (Signed)
Left a message requesting pt's daughter to return call to the office.

## 2022-08-23 NOTE — Telephone Encounter (Signed)
Will you write her a Rx for diabetic shoes or does she have to be seen by the podiatrist first.

## 2022-08-23 NOTE — Telephone Encounter (Signed)
The podiatrist is the one who recommends the certain type of footwear based off foot measurements and orthopedic needs.  Then, they send over the script for [place] to review, sign and send back for fulfillment.

## 2022-08-23 NOTE — Telephone Encounter (Signed)
Patient's daughter came by and said that she is needing a prescription for Diabetic Shoes. Please call her at [phone] Binnie Kand)

## 2022-08-23 NOTE — Telephone Encounter (Signed)
Discussed with pt's daughter the need to see the podiatrist for the proper measurements and recommendations for pt's footwear. Understanding voiced.

## 2022-10-13 ENCOUNTER — Encounter: Payer: Self-pay | Admitting: Physician Assistant

## 2022-11-02 ENCOUNTER — Encounter: Payer: Self-pay | Admitting: Nurse Practitioner

## 2022-11-02 ENCOUNTER — Ambulatory Visit (INDEPENDENT_AMBULATORY_CARE_PROVIDER_SITE_OTHER): Payer: 59 | Admitting: Nurse Practitioner

## 2022-11-02 VITALS — BP 126/64 | HR 82 | Ht 62.0 in | Wt 132.6 lb

## 2022-11-02 DIAGNOSIS — E1122 Type 2 diabetes mellitus with diabetic chronic kidney disease: Secondary | ICD-10-CM | POA: Diagnosis not present

## 2022-11-02 DIAGNOSIS — N1832 Chronic kidney disease, stage 3b: Secondary | ICD-10-CM | POA: Diagnosis not present

## 2022-11-02 DIAGNOSIS — E559 Vitamin D deficiency, unspecified: Secondary | ICD-10-CM | POA: Diagnosis not present

## 2022-11-02 DIAGNOSIS — I10 Essential (primary) hypertension: Secondary | ICD-10-CM | POA: Diagnosis not present

## 2022-11-02 DIAGNOSIS — Z794 Long term (current) use of insulin: Secondary | ICD-10-CM | POA: Diagnosis not present

## 2022-11-02 DIAGNOSIS — E782 Mixed hyperlipidemia: Secondary | ICD-10-CM | POA: Diagnosis not present

## 2022-11-02 DIAGNOSIS — Z7984 Long term (current) use of oral hypoglycemic drugs: Secondary | ICD-10-CM

## 2022-11-02 LAB — POCT GLYCOSYLATED HEMOGLOBIN (HGB A1C): Hemoglobin A1C: 7.7 % — AB (ref 4.0–5.6)

## 2022-11-02 NOTE — Progress Notes (Signed)
Endocrinology follow-up  Note       11/02/2022, 1:59 PM   Subjective:    Patient ID: Amy Stein, female    DOB: 07-01-1933.  Amy Stein is being seen in follow-up for management of currently uncontrolled symptomatic type 2 diabetes, hypertension, hyperlipidemia. PMD:   Benetta Spar, MD.   Past Medical History:  Diagnosis Date   Dementia [facility])    Diabetes mellitus    Hypertension    Shingles    on both legs 1/18   Past Surgical History:  Procedure Laterality Date   COLONOSCOPY     COLONOSCOPY N/A 02/23/2017   Procedure: COLONOSCOPY;  Surgeon: Corbin Ade, MD;  Location: [facility];  Service: Endoscopy;  Laterality: N/A;  9:30 Am   POLYPECTOMY  02/23/2017   Procedure: POLYPECTOMY;  Surgeon: Corbin Ade, MD;  Location: [facility];  Service: Endoscopy;;  cecum   SHOULDER SURGERY Left    August 2022   Social History   Socioeconomic History   Marital status: Single    Spouse name: Not on file   Number of children: Not on file   Years of education: Not on file   Highest education level: Not on file  Occupational History   Not on file  Tobacco Use   Smoking status: Former   Smokeless tobacco: Never  Vaping Use   Vaping status: Never Used  Substance and Sexual Activity   Alcohol use: No   Drug use: No   Sexual activity: Yes    Birth control/protection: Post-menopausal  Other Topics Concern   Not on file  Social History Narrative   Not on file   Social Determinants of Health   Financial Resource Strain: Low Risk  (12/16/2021)   Overall Financial Resource Strain (CARDIA)    Difficulty of Paying Living Expenses: Not hard at all  Food Insecurity: No Food Insecurity (12/16/2021)   Hunger Vital Sign    Worried About Running Out of Food in the Last Year: Never true    Ran Out of Food in the Last Year: Never true  Transportation Needs: No Transportation Needs (12/16/2021)    PRAPARE - Administrator, Civil Service (Medical): No    Lack of Transportation (Non-Medical): No  Physical Activity: Insufficiently Active (12/16/2021)   Exercise Vital Sign    Days of Exercise per Week: 7 days    Minutes of Exercise per Session: 10 min  Stress: No Stress Concern Present (12/16/2021)   [employer] of Occupational Health - Occupational Stress Questionnaire    Feeling of Stress : Not at all  Social Connections: Unknown (12/16/2021)   Social Connection and Isolation Panel [NHANES]    Frequency of Communication with Friends and Family: More than three times a week    Frequency of Social Gatherings with Friends and Family: Three times a week    Attends Religious Services: Patient declined    Active Member of Clubs or Organizations: No    Attends Banker Meetings: Patient declined    Marital Status: Patient declined   Outpatient Encounter Medications as of 11/02/2022  Medication Sig   Acetaminophen (TYLENOL PO) Take by  mouth.   amLODipine (NORVASC) 5 MG tablet Take 5 mg by mouth daily.   aspirin EC 81 MG tablet Take 81 mg by mouth daily. Swallow whole.   ciprofloxacin (CIPRO) 250 MG tablet SMARTSIG:1 Tablet(s) By Mouth Every 12 Hours   ferrous sulfate 325 (65 FE) MG tablet Take 1 tablet (325 mg total) by mouth 2 (two) times daily with a meal. (Patient taking differently: Take 325 mg by mouth 2 (two) times daily with a meal. It is reported that hs ei staking once a day.)   gabapentin (NEURONTIN) 400 MG capsule Take 400 mg by mouth 3 (three) times daily. Patient is taking two a day   HM VITAMIN D3 100 MCG (4000 UT) CAPS TAKE ONE CAPSULE (5000 UNITS TOTAL) BY MOUTH DAILY   hydrochlorothiazide (MICROZIDE) 12.5 MG capsule Take 12.5 mg by mouth daily.   hydrocortisone 2.5 % cream Apply topically 2 (two) times daily.   losartan (COZAAR) 100 MG tablet Take 100 mg by mouth daily.   omeprazole (PRILOSEC) 20 MG capsule Take 20 mg by mouth daily.    simvastatin  (ZOCOR) 40 MG tablet Take 40 mg by mouth at bedtime.    solifenacin (VESICARE) 10 MG tablet Take 1 tablet (10 mg total) by mouth daily.   TRADJENTA 5 MG TABS tablet Take 1 tablet (5 mg total) by mouth daily.   ACCU-CHEK GUIDE test strip USE TO TEST THREE TIMESCDAILY. (Patient not taking: Reported on 12/17/2021)   Accu-Chek Softclix Lancets lancets daily. (Patient not taking: Reported on 12/16/2021)   ALLERGY RELIEF 10 MG tablet Take 10 mg by mouth at bedtime. (Patient not taking: Reported on 07/13/2022)   Blood Glucose Monitoring Suppl (ACCU-CHEK GUIDE) w/Device KIT  (Patient not taking: Reported on 12/16/2021)   fluticasone (FLONASE) 50 MCG/ACT nasal spray Place 1 spray into both nostrils daily for 14 days.   UNABLE TO FIND Cream for feet-daily (Patient not taking: Reported on 05/04/2022)   No facility-administered encounter medications on file as of 11/02/2022.    ALLERGIES: Allergies  Allergen Reactions   Sulfa Antibiotics    Sulfonamide Derivatives Rash    VACCINATION STATUS:  There is no immunization history on file for this patient.  Diabetes She presents for her follow-up diabetic visit. She has type 2 diabetes mellitus. Onset time: She was diagnosed at approximate age of 20 years. Her disease course has been stable. There are no hypoglycemic associated symptoms. Pertinent negatives for hypoglycemia include no confusion, headaches, pallor or seizures. There are no diabetic associated symptoms. Pertinent negatives for diabetes include no chest pain, no polydipsia, no polyphagia and no polyuria. There are no hypoglycemic complications. Symptoms are stable. Diabetic complications include nephropathy. Risk factors for coronary artery disease include diabetes mellitus, hypertension, sedentary lifestyle, tobacco exposure, dyslipidemia and post-menopausal. Current diabetic treatment includes oral agent (monotherapy). She is compliant with treatment all of the time. Her weight is decreasing steadily.  She is following a generally healthy diet. When asked about meal planning, she reported none. She has not had a previous visit with a dietitian. She rarely participates in exercise. Her home blood glucose trend is fluctuating minimally. (She presents today, accompanied by her aide, with no logs or meter (was given a break from routine monitoring due to discontinuation of insulin LV).  Her POCT A1c today is 7.7%, increasing slightly from last visit of 7.4%.  This is an ACCEPTABLE target for her given her age and comorbidities.  ) An ACE inhibitor/angiotensin II receptor blocker is not being taken (  was taken off by nephrologist). She does not see a podiatrist.Eye exam is current.  Hypertension This is a chronic problem. The current episode started more than 1 year ago. The problem has been gradually improving since onset. The problem is controlled. Pertinent negatives include no chest pain, headaches, palpitations or shortness of breath. There are no associated agents to hypertension. Risk factors for coronary artery disease include diabetes mellitus, sedentary lifestyle, smoking/tobacco exposure, post-menopausal state and dyslipidemia. Past treatments include diuretics, calcium channel blockers and beta blockers. The current treatment provides moderate improvement. Compliance problems include exercise.  Hypertensive end-organ damage includes kidney disease. Identifiable causes of hypertension include chronic renal disease.     Review of systems  Constitutional: + decreasing body weight,  current Body mass index is 24.25 kg/m. , no fatigue, no subjective hyperthermia, no subjective hypothermia, apparent memory issues Eyes: no blurry vision, no xerophthalmia ENT: no sore throat, no nodules palpated in throat, no dysphagia/odynophagia, no hoarseness Cardiovascular: no chest pain, no shortness of breath, no palpitations, no leg swelling Respiratory: no cough, no shortness of breath Gastrointestinal: no  nausea/vomiting/diarrhea Musculoskeletal: no muscle/joint aches, walks with cane  Skin: no rashes, no hyperemia Neurological: no tremors, no numbness, no tingling, no dizziness Psychiatric: no depression, no anxiety    Objective:    BP 126/64 (BP Location: Right Arm, Patient Position: Sitting, Cuff Size: Normal)   Pulse 82   Ht 5\' 2"  (1.575 m)   Wt 132 lb 9.6 oz (60.1 kg)   BMI 24.25 kg/m   Wt Readings from Last 3 Encounters:  11/02/22 132 lb 9.6 oz (60.1 kg)  07/13/22 141 lb (64 kg)  05/04/22 139 lb 12.8 oz (63.4 kg)    BP Readings from Last 3 Encounters:  11/02/22 126/64  07/13/22 108/65  05/04/22 112/71     Physical Exam- Limited  Constitutional:  Body mass index is 24.25 kg/m. , not in acute distress, Memory impairment Eyes:  EOMI, no exophthalmos Neck: Supple Musculoskeletal: no gross deformities, strength intact in all four extremities, no gross restriction of joint movements, walks with cane Skin:  no rashes, no hyperemia Neurological: no tremor with outstretched hands   Diabetic Foot Exam - Simple   Simple Foot Form Diabetic Foot exam was performed with the following findings: Yes 11/02/2022 10:45 AM  Visual Inspection No deformities, no ulcerations, no other skin breakdown bilaterally: Yes Sensation Testing Intact to touch and monofilament testing bilaterally: Yes Pulse Check Posterior Tibialis and Dorsalis pulse intact bilaterally: Yes Comments Sees podiatry routinely      CMP     Component Value Date/Time   NA 137 08/18/2021 1609   NA 143 04/24/2020 1044   K 3.9 08/18/2021 1609   CL 105 08/18/2021 1609   CO2 24 08/18/2021 1609   GLUCOSE 107 (H) 08/18/2021 1609   BUN 23 08/18/2021 1609   BUN 21 04/24/2020 1044   CREATININE 1.33 (H) 08/18/2021 1609   CREATININE 2.00 (H) 09/18/2019 0848   CALCIUM 9.2 08/18/2021 1609   PROT 6.8 08/18/2021 1609   PROT 7.1 04/24/2020 1044   ALBUMIN 3.6 08/18/2021 1609   ALBUMIN 4.1 04/24/2020 1044   AST 20  08/18/2021 1609   ALT 14 08/18/2021 1609   ALKPHOS 78 08/18/2021 1609   BILITOT 0.6 08/18/2021 1609   BILITOT 0.2 04/24/2020 1044   GFRNONAA 39 (L) 08/18/2021 1609   GFRNONAA 23 (L) 09/18/2019 0848   GFRAA 42 (L) 04/24/2020 1044   GFRAA 26 (L) 09/18/2019 0848    Diabetic  Labs (most recent): Lab Results  Component Value Date   HGBA1C 7.7 (A) 11/02/2022   HGBA1C 7.4 (A) 05/04/2022   HGBA1C 7.1 (A) 12/17/2021   MICROALBUR 2.6 09/18/2019   MICROALBUR 0.8 02/19/2019   MICROALBUR 0.6 01/31/2018      Lab Results  Component Value Date   TSH 0.936 07/01/2021   TSH 1.68 09/18/2019   TSH 1.29 01/31/2018   TSH 0.793 05/26/2017   FREET4 0.75 07/01/2021   FREET4 0.9 09/18/2019   FREET4 0.9 01/31/2018   Lipid Panel     Component Value Date/Time   CHOL 148 07/01/2021 1241   TRIG 99 07/01/2021 1241   HDL 42 07/01/2021 1241   CHOLHDL 3.5 07/01/2021 1241   VLDL 20 07/01/2021 1241   LDLCALC 86 07/01/2021 1241   LDLCALC 67 09/18/2019 0848      Assessment & Plan:   1) Type 2 diabetes mellitus with stage 3 chronic kidney disease, with long-term current use of insulin (HCC)  - Gerald Leitz Agudelo has currently uncontrolled symptomatic type 2 DM since 89 years of age.    She presents today, accompanied by her aide, with no logs or meter (was given a break from routine monitoring due to discontinuation of insulin LV).  Her POCT A1c today is 7.7%, increasing slightly from last visit of 7.4%.  This is an ACCEPTABLE target for her given her age and comorbidities.    - Recent labs reviewed.  -her diabetes is complicated by stage 4 renal insufficiency and Amy Stein remains at a high risk for more acute and chronic complications which include CAD, CVA, CKD, retinopathy, and neuropathy. These are all discussed in detail with the patient.  - Nutritional counseling repeated at each appointment due to patients tendency to fall back in to old habits.  - The patient admits there is a room for  improvement in their diet and drink choices. -  Suggestion is made for the patient to avoid simple carbohydrates from their diet including Cakes, Sweet Desserts / Pastries, Ice Cream, Soda (diet and regular), Sweet Tea, Candies, Chips, Cookies, Sweet Pastries, Store Bought Juices, Alcohol in Excess of 1-2 drinks a day, Artificial Sweeteners, Coffee Creamer, and "Sugar-free" Products. This will help patient to have stable blood glucose profile and potentially avoid unintended weight gain.   - I encouraged the patient to switch to unprocessed or minimally processed complex starch and increased protein intake (animal or plant source), fruits, and vegetables.   - Patient is advised to stick to a routine mealtimes to eat 3 meals a day and avoid unnecessary snacks (to snack only to correct hypoglycemia).  - I have approached her with the following individualized plan to manage diabetes and patient agrees:   -She is elderly patient with several comorbidities,  #1 priority and treating her diabetes would be to avoid hypoglycemia.    -Given her stable, at goal A1c, no changed will be made to her medications today.  She is advised to continue Tradjenta 5 mg po daily.  -She can officially take a break from glucose monitoring at this time due to safe medication regimen.  -She is not a candidate for metformin, SGLT2 inhibitors due to CKD.  -- Patient specific target  A1c;  LDL, HDL, Triglycerides, and  Waist Circumference were discussed in detail.  2) BP/HTN:  Her blood pressure is controlled to target.  She is advised to continue Amlodipine 5 mg po daily, HCTZ 12.5 mg po daily and Losartan 100 mg po daily.  Will defer med changes to nephrologist.  3) Lipids/HPL:    Her most recent lipid panel from 07/01/21 shows controlled LDL of 86.  She is advised to continue Simvastatin 40 mg po daily at bedtime.  Side effects and precautions discussed with her.    4)  Weight/Diet:  Her Body mass index is 24.25  kg/m.-not a candidate for weight loss.  CDE Consult will be initiated, exercise, and detailed carbohydrates information provided.  5) Vitamin D deficiency- -Her most recent vitamin D level was 63 on 08/03/22.  She is advised to stop all vitamin D supplementation at this time.   6) Chronic Care/Health Maintenance: -she is on ARB and Statin medications and is encouraged to initiate and continue to follow up with Ophthalmology, Dentist, Podiatrist at least yearly or according to recommendations, and advised to  stay away from smoking. I have recommended yearly flu vaccine and pneumonia vaccine at least every 5 years; moderate intensity exercise for up to 150 minutes weekly; and  sleep for at least 7 hours a day.  - I advised patient to maintain close follow up with Benetta Spar, MD for primary care needs.      I spent  31  minutes in the care of the patient today including review of labs from CMP, Lipids, Thyroid Function, Hematology (current and previous including abstractions from other facilities); face-to-face time discussing  her blood glucose readings/logs, discussing hypoglycemia and hyperglycemia episodes and symptoms, medications doses, her options of short and long term treatment based on the latest standards of care / guidelines;  discussion about incorporating lifestyle medicine;  and documenting the encounter. Risk reduction counseling performed per USPSTF guidelines to reduce obesity and cardiovascular risk factors.     Please refer to Patient Instructions for Blood Glucose Monitoring and Insulin/Medications Dosing Guide"  in media tab for additional information. Please  also refer to " Patient Self Inventory" in the Media  tab for reviewed elements of pertinent patient history.  Amy Stein participated in the discussions, expressed understanding, and voiced agreement with the above plans.  All questions were answered to her satisfaction. she is encouraged to contact clinic  should she have any questions or concerns prior to her return visit.   Follow up plan: - Return in about 6 months (around 05/05/2023) for Diabetes F/U with A1c in office, No previsit labs.   Ronny Bacon, [facility] [address] [place], [place] [address] Phone: [phone] Fax: [phone]  11/02/2022, 1:59 PM

## 2022-11-11 ENCOUNTER — Ambulatory Visit: Payer: 59

## 2022-11-11 ENCOUNTER — Telehealth: Payer: Self-pay | Admitting: Nurse Practitioner

## 2022-11-11 ENCOUNTER — Ambulatory Visit (INDEPENDENT_AMBULATORY_CARE_PROVIDER_SITE_OTHER): Payer: 59 | Admitting: Physician Assistant

## 2022-11-11 ENCOUNTER — Encounter: Payer: Self-pay | Admitting: Physician Assistant

## 2022-11-11 ENCOUNTER — Other Ambulatory Visit (INDEPENDENT_AMBULATORY_CARE_PROVIDER_SITE_OTHER): Payer: 59

## 2022-11-11 VITALS — BP 130/80 | HR 95 | Resp 18 | Ht 62.0 in | Wt 135.0 lb

## 2022-11-11 DIAGNOSIS — F02B0 Dementia in other diseases classified elsewhere, moderate, without behavioral disturbance, psychotic disturbance, mood disturbance, and anxiety: Secondary | ICD-10-CM

## 2022-11-11 DIAGNOSIS — R413 Other amnesia: Secondary | ICD-10-CM | POA: Diagnosis not present

## 2022-11-11 NOTE — Progress Notes (Signed)
Assessment/Plan:    The patient is seen in neurologic consultation at the request of Ellyn Hack, MD for the evaluation of memory.  Amy Stein is a very pleasant 89 y.o. year old RH female with a history of hypertension, hyperlipidemia, DM2 with neuropathy, iron deficiency anemia, vitamin D3 deficiency, GERD, seen today for evaluation of memory loss. [name] today is 2/30.  She needs assistance with most ADLs.  Family is interested in pursuing imaging and basic lab work, to determine if there are any other reversible causes of memory loss.  Is important to understand that the antidementia medications available to [place] at this time, are not designed to improve or to reverse the memory loss, only to slow the process however in today's testing, significant cognitive decline is demonstrated, therefore the role of antidementia medication is not indicated, especially given her advanced age, comorbidities, with side effects outweighing the benefits of the drug.  Dementia likely due to Alzheimer's disease with vascular etiology  MRI brain without contrast to assess for underlying structural abnormality and assess vascular load  Check B12, TSH Continue 24/7 monitoring for safety Recommend good control of cardiovascular risk factors.   Continue to control mood as per PCP Folllow up pending on the results of the imaging and labs     Subjective:    The patient is accompanied by her caregiver who supplements the history.    How long did patient have memory difficulties? Unknown, suspected many years. Patient has difficulty remembering recent conversations, and names of people.  When she is watching TV she waves at the TV characters. Repeats oneself?  Endorsed "all the time".  Disoriented when walking into a room?  Patient denies    Leaving objects in unusual places? "Oh Lord Jesus, yes!"-caregiver says, she lost the lipstick , somewhere in the bedroom, after a week she found that.  She loses things  all the time.  Any personality changes ? denies  "Sometimes she has some bad moments but overall she is fine, happy" Any history of depression?: If she feels down and hears her favorite song "Let it go" and she feels better. Hallucinations or paranoia?  "2 weeks ago, she told the caregiver some dude was sitting beside her". "She scared me "-caregiver says.  Seizures? denies    Any sleep changes?  Sleeps well  Denies vivid dreams, REM behavior or sleepwalking   Sleep apnea? denies   Any hygiene concerns? Caregiver helps her.  Independent of bathing and dressing? She needs assistance. Cannot choose her clothes, but able to get dress by herself Does the patient need help with medications?  [name] is in charge   Who is in charge of the finances? Daughter  is in charge     Any changes in appetite?  "She eats a lot, loves her sweets"    Patient have trouble swallowing?  denies "he has a heathy throat " she laughs  Does the patient cook? No   Any headaches?  denies   Chronic pain? denies   Ambulates with difficulty?   Needs a R cane to ambulate for stability.   Recent falls or head injuries? denies     Vision changes? Unilateral weakness, numbness or tingling? denies  "She can move well, girl!"-caregiver says  Any tremors? denies   Any anosmia? denies   Any incontinence of urine?  Endorsed. Any bowel dysfunction? denies      Patient lives with grandson. Her caregiver is there most of the  time History of heavy alcohol intake? denies   History of heavy tobacco use? denies   Family history of dementia? Unknown   Allergies  Allergen Reactions   Sulfa Antibiotics    Sulfonamide Derivatives Rash    Current Outpatient Medications  Medication Instructions   ACCU-CHEK GUIDE test strip    Accu-Chek Softclix Lancets lancets Daily   Acetaminophen (TYLENOL PO) Oral   Allergy Relief 10 mg, Oral, Daily at bedtime   amLODipine (NORVASC) 5 mg, Oral, Daily   aspirin EC 81 mg, Oral, Daily, Swallow  whole.   Blood Glucose Monitoring Suppl (ACCU-CHEK GUIDE) w/Device KIT No dose, route, or frequency recorded.   ciprofloxacin (CIPRO) 250 MG tablet SMARTSIG:1 Tablet(s) By Mouth Every 12 Hours   ferrous sulfate 325 mg, Oral, 2 times daily with meals   fluticasone (FLONASE) 50 MCG/ACT nasal spray 1 spray, Each Nare, Daily   gabapentin (NEURONTIN) 400 mg, Oral, 3 times daily, Patient is taking two a day   HM VITAMIN D3 100 MCG (4000 UT) CAPS TAKE ONE CAPSULE (5000 UNITS TOTAL) BY MOUTH DAILY   hydrochlorothiazide (MICROZIDE) 12.5 mg, Oral, Daily   hydrocortisone 2.5 % cream Topical, 2 times daily   losartan (COZAAR) 100 mg, Oral, Daily   omeprazole (PRILOSEC) 20 mg, Oral, Daily   simvastatin (ZOCOR) 40 mg, Oral, Daily at bedtime   solifenacin (VESICARE) 10 mg, Oral, Daily   Tradjenta 5 mg, Oral, Daily   UNABLE TO FIND Cream for feet-daily     VITALS:   Vitals:   11/11/22 1305  BP: (!) 146/87  Pulse: 95  Resp: 18  SpO2: 98%  Weight: 135 lb (61.2 kg)  Height: 5\' 2"  (1.575 m)    PHYSICAL EXAM   HEENT:  Normocephalic, atraumatic.  The superficial temporal arteries are without ropiness or tenderness. Cardiovascular: Regular rate and rhythm. Lungs: Clear to auscultation bilaterally. Neck: There are no carotid bruits noted bilaterally.  NEUROLOGICAL:    11/11/2022    1:00 PM  Montreal Cognitive Assessment   Visuospatial/ Executive (0/5) 1  Naming (0/3) 0  Attention: Read list of digits (0/2) 0  Attention: Read list of letters (0/1) 0  Attention: Serial 7 subtraction starting at 100 (0/3) 0  Language: Repeat phrase (0/2) 0  Language : Fluency (0/1) 0  Abstraction (0/2) 0  Delayed Recall (0/5) 0  Orientation (0/6) 0  Total 1  Adjusted Score (based on education) 2        No data to display           Orientation:  Alert and oriented to person, not to place or time. No aphasia or dysarthria. Fund of knowledge is reduced.  Recent and remote memory impaired.  Attention and  concentration are reduced.  Able to name some objects and unable repeat phrases. Delayed recall 0/5  cranial nerves: There is good facial symmetry. Extraocular muscles are intact and visual fields are full to confrontational testing. Speech is fluent and clear. No tongue deviation. Hearing is intact to conversational tone.  Tone: Tone is good throughout. Sensation: Sensation is intact to light touch.  Vibration is intact at the bilateral big toe.  Coordination: The patient has no difficulty with RAM's or FNF bilaterally. Normal finger to nose  Motor: Strength is 5/5 in the bilateral upper and lower extremities. There is no pronator drift. There are no fasciculations noted. DTR's: Deep tendon reflexes are 2/4 bilaterally. Gait and Station: The patient is able to ambulate without difficulty. Gait is cautious and narrow.  Stride length is normal          Thank you for allowing [place] the opportunity to participate in the care of this nice patient. Please do not hesitate to contact us for any questions or concerns.   Total time spent on today's visit was 43 minutes dedicated to this patient today, preparing to see patient, examining the patient, ordering tests and/or medications and counseling the patient, documenting clinical information in the EHR or other health record, independently interpreting results and communicating results to the patient/family, discussing treatment and goals, answering patient's questions and coordinating care.  Cc:  Benetta Spar, MD  Marlowe Kays 11/11/2022 2:04 PM

## 2022-11-11 NOTE — Telephone Encounter (Signed)
That's what I thought but I wanted to make sure and clarify with you.

## 2022-11-11 NOTE — Patient Instructions (Addendum)
It was a pleasure to see you today at our office.   Recommendations:  Follow up pending on the results of MRI   Continue 24/7 monitoring                                                   For assessment of decision of mental capacity and competency:  Call Dr. Erick Blinks, geriatric psychiatrist at [phone] Counseling regarding caregiver distress, including caregiver depression, anxiety and issues regarding community resources, adult day care programs, adult living facilities, or memory care questions:  please contact your  Primary Doctor's Social Worker  Whom to call: Memory  decline, memory medications: Call our office [phone]  For psychiatric meds, mood meds: Please have your primary care physician manage these medications.  If you have any severe symptoms of a stroke, or other severe issues such as confusion,severe chills or fever, etc call 911 or go to the ER as you may need to be evaluated further   [email or web address]   RECOMMENDATIONS FOR ALL PATIENTS WITH MEMORY PROBLEMS: 1. Continue to exercise (Recommend 30 minutes of walking everyday, or 3 hours every week) 2. Increase social interactions - continue going to [place] and enjoy social gatherings with friends and family 3. Eat healthy, avoid fried foods and eat more fruits and vegetables 4. Maintain adequate blood pressure, blood sugar, and blood cholesterol level. Reducing the risk of stroke and cardiovascular disease also helps promoting better memory. 5. Avoid stressful situations. Live a simple life and avoid aggravations. Organize your time and prepare for the next day in anticipation. 6. Sleep well, avoid any interruptions of sleep and avoid any distractions in the bedroom that may interfere with adequate sleep quality 7. Avoid sugar, avoid sweets as there is a strong link between excessive sugar intake, diabetes, and cognitive impairment We discussed the  Mediterranean diet, which has been shown to help patients reduce the risk of progressive memory disorders and reduces cardiovascular risk. This includes eating fish, eat fruits and green leafy vegetables, nuts like almonds and hazelnuts, walnuts, and also use olive oil. Avoid fast foods and fried foods as much as possible. Avoid sweets and sugar as sugar use has been linked to worsening of memory function.  There is always a concern of gradual progression of memory problems. If this is the case, then we may need to adjust level of care according to patient needs. Support, both to the patient and caregiver, should then be put into place.    The Alzheimer's Association is here all day, every day for people facing Alzheimer's disease through our free 24/7 Helpline: [phone]. The Helpline provides reliable information and support to all those who need assistance, such as individuals living with memory loss, Alzheimer's or other dementia, caregivers, health care professionals and the public.  Our highly trained and knowledgeable staff can help you with: Understanding memory loss, dementia and Alzheimer's  Medications and other treatment options  General information about aging and brain health  Skills to provide quality care and to find the best care from professionals  Legal, financial and living-arrangement decisions Our Helpline also features: Confidential care consultation provided by master's level clinicians who can help with decision-making support, crisis assistance and education on issues families face every day  Help in a caller's preferred language using our translation service that features more than 200 languages  and dialects  Referrals to local community programs, services and ongoing support     FALL PRECAUTIONS: Be cautious when walking. Scan the area for obstacles that may increase the risk of trips and falls. When getting up in the mornings, sit up at the edge of the bed for a  few minutes before getting out of bed. Consider elevating the bed at the head end to avoid drop of blood pressure when getting up. Walk always in a well-lit room (use night lights in the walls). Avoid area rugs or power cords from appliances in the middle of the walkways. Use a walker or a cane if necessary and consider physical therapy for balance exercise. Get your eyesight checked regularly.  FINANCIAL OVERSIGHT: Supervision, especially oversight when making financial decisions or transactions is also recommended.  HOME SAFETY: Consider the safety of the kitchen when operating appliances like stoves, microwave oven, and blender. Consider having supervision and share cooking responsibilities until no longer able to participate in those. Accidents with firearms and other hazards in the house should be identified and addressed as well.   ABILITY TO BE LEFT ALONE: If patient is unable to contact 911 operator, consider using LifeLine, or when the need is there, arrange for someone to stay with patients. Smoking is a fire hazard, consider supervision or cessation. Risk of wandering should be assessed by caregiver and if detected at any point, supervision and safe proof recommendations should be instituted.  MEDICATION SUPERVISION: Inability to self-administer medication needs to be constantly addressed. Implement a mechanism to ensure safe administration of the medications.   DRIVING: Regarding driving, in patients with progressive memory problems, driving will be impaired. We advise to have someone else do the driving if trouble finding directions or if minor accidents are reported. Independent driving assessment is available to determine safety of driving.   If you are interested in the driving assessment, you can contact the following:  [employer] in [place] [phone]  [facility] [phone]  [facility] [phone]  [phone] or [phone]      Mediterranean Diet A Mediterranean diet refers to food and lifestyle choices that are based on the traditions of countries located on the [employer]. This way of eating has been shown to help prevent certain conditions and improve outcomes for people who have chronic diseases, like kidney disease and heart disease. What are tips for following this plan? Lifestyle  Cook and eat meals together with your family, when possible. Drink enough fluid to keep your urine clear or pale yellow. Be physically active every day. This includes: Aerobic exercise like running or swimming. Leisure activities like gardening, walking, or housework. Get 7-8 hours of sleep each night. If recommended by your health care provider, drink red wine in moderation. This means 1 glass a day for nonpregnant women and 2 glasses a day for men. A glass of wine equals 5 oz (150 mL). Reading food labels  Check the serving size of packaged foods. For foods such as rice and pasta, the serving size refers to the amount of cooked product, not dry. Check the total fat in packaged foods. Avoid foods that have saturated fat or trans fats. Check the ingredients list for added sugars, such as corn syrup. Shopping  At the grocery store, buy most of your food from the areas near the walls of the store. This includes: Fresh fruits and vegetables (produce). Grains, beans, nuts, and seeds. Some of these may be available  in unpackaged forms or large amounts (in bulk). Fresh seafood. Poultry and eggs. Low-fat dairy products. Buy whole ingredients instead of prepackaged foods. Buy fresh fruits and vegetables in-season from local farmers markets. Buy frozen fruits and vegetables in resealable bags. If you do not have access to quality fresh seafood, buy precooked frozen shrimp or canned fish, such as tuna, salmon, or sardines. Buy small amounts of raw or cooked vegetables, salads, or olives from the  deli or salad bar at your store. Stock your pantry so you always have certain foods on hand, such as olive oil, canned tuna, canned tomatoes, rice, pasta, and beans. Cooking  Cook foods with extra-virgin olive oil instead of using butter or other vegetable oils. Have meat as a side dish, and have vegetables or grains as your main dish. This means having meat in small portions or adding small amounts of meat to foods like pasta or stew. Use beans or vegetables instead of meat in common dishes like chili or lasagna. Experiment with different cooking methods. Try roasting or broiling vegetables instead of steaming or sauteing them. Add frozen vegetables to soups, stews, pasta, or rice. Add nuts or seeds for added healthy fat at each meal. You can add these to yogurt, salads, or vegetable dishes. Marinate fish or vegetables using olive oil, lemon juice, garlic, and fresh herbs. Meal planning  Plan to eat 1 vegetarian meal one day each week. Try to work up to 2 vegetarian meals, if possible. Eat seafood 2 or more times a week. Have healthy snacks readily available, such as: Vegetable sticks with hummus. Greek yogurt. Fruit and nut trail mix. Eat balanced meals throughout the week. This includes: Fruit: 2-3 servings a day Vegetables: 4-5 servings a day Low-fat dairy: 2 servings a day Fish, poultry, or lean meat: 1 serving a day Beans and legumes: 2 or more servings a week Nuts and seeds: 1-2 servings a day Whole grains: 6-8 servings a day Extra-virgin olive oil: 3-4 servings a day Limit red meat and sweets to only a few servings a month What are my food choices? Mediterranean diet Recommended Grains: Whole-grain pasta. [place] rice. Bulgar wheat. Polenta. Couscous. Whole-wheat bread. Orpah Cobb. Vegetables: Artichokes. Beets. Broccoli. Cabbage. Carrots. Eggplant. Green beans. Chard. Kale. Spinach. Onions. Leeks. Peas. Squash. Tomatoes. Peppers. Radishes. Fruits: Apples. Apricots.  Avocado. Berries. Bananas. Cherries. Dates. Figs. Grapes. Lemons. Melon. Oranges. Peaches. Plums. Pomegranate. Meats and other protein foods: Beans. Almonds. Sunflower seeds. Pine nuts. Peanuts. Cod. Salmon. Scallops. Shrimp. Tuna. Tilapia. Clams. Oysters. Eggs. Dairy: Low-fat milk. Cheese. Greek yogurt. Beverages: Water. Red wine. Herbal tea. Fats and oils: Extra virgin olive oil. Avocado oil. Grape seed oil. Sweets and desserts: [place] yogurt with honey. Baked apples. Poached pears. Trail mix. Seasoning and other foods: Basil. Cilantro. Coriander. Cumin. Mint. Parsley. Sage. Rosemary. Tarragon. Garlic. Oregano. Thyme. Pepper. Balsalmic vinegar. Tahini. Hummus. Tomato sauce. Olives. Mushrooms. Limit these Grains: Prepackaged pasta or rice dishes. Prepackaged cereal with added sugar. Vegetables: Deep fried potatoes (french fries). Fruits: Fruit canned in syrup. Meats and other protein foods: Beef. Pork. Lamb. Poultry with skin. Hot dogs. Tomasa Blase. Dairy: Ice cream. Sour cream. Whole milk. Beverages: Juice. Sugar-sweetened soft drinks. Beer. Liquor and spirits. Fats and oils: Butter. Canola oil. Vegetable oil. Beef fat (tallow). Lard. Sweets and desserts: Cookies. Cakes. Pies. Candy. Seasoning and other foods: Mayonnaise. Premade sauces and marinades. The items listed may not be a complete list. Talk with your dietitian about what dietary choices are right for you. Summary The Mediterranean  diet includes both food and lifestyle choices. Eat a variety of fresh fruits and vegetables, beans, nuts, seeds, and whole grains. Limit the amount of red meat and sweets that you eat. Talk with your health care provider about whether it is safe for you to drink red wine in moderation. This means 1 glass a day for nonpregnant women and 2 glasses a day for men. A glass of wine equals 5 oz (150 mL). This information is not intended to replace advice given to you by your health care provider. Make sure you discuss  any questions you have with your health care provider. Document Released: 11/20/2015 Document Revised: 12/23/2015 Document Reviewed: 11/20/2015 Elsevier Interactive Patient Education  2017 [employer].      MRI at [facility] [phone]

## 2022-11-11 NOTE — Telephone Encounter (Signed)
Pts daughter has called requesting a prescription for diabetic shoes.  Call Fransico Setters [phone] if you have other questions.

## 2022-11-11 NOTE — Telephone Encounter (Signed)
Goodness, we have discussed this before with her.  She needs to see a podiatrist so they can choose the right type of diabetic shoe for her.

## 2022-11-12 NOTE — Progress Notes (Signed)
B12 and thyroid levels are normal thank you

## 2022-11-15 NOTE — Progress Notes (Signed)
Call can't go thru on POA , number above is Grandson, he Is not on the [facility]

## 2022-11-16 ENCOUNTER — Telehealth: Payer: Self-pay | Admitting: Physician Assistant

## 2022-11-16 NOTE — Telephone Encounter (Signed)
Has someone called the daughter to let her know.  She will have other questions.

## 2022-11-16 NOTE — Telephone Encounter (Signed)
Pts daughter called back requesting to speak with nurse

## 2022-11-16 NOTE — Telephone Encounter (Signed)
Spoke with pt's daughter advising her that pt would need to be seen and evaluated by a podiatrist so she can be properly fitted for diabetic shoes per Ronny Bacon, FNP. Understanding voiced.

## 2022-11-25 ENCOUNTER — Encounter (HOSPITAL_COMMUNITY): Payer: Self-pay | Admitting: Nurse Practitioner

## 2022-12-27 ENCOUNTER — Ambulatory Visit
Admission: RE | Admit: 2022-12-27 | Discharge: 2022-12-27 | Disposition: A | Discharge status: Home / Self Care | Payer: 59 | Source: Ambulatory Visit | Attending: Physician Assistant | Admitting: Physician Assistant

## 2023-01-03 ENCOUNTER — Ambulatory Visit: Payer: 59 | Admitting: Adult Health

## 2023-01-03 ENCOUNTER — Encounter: Payer: Self-pay | Admitting: Adult Health

## 2023-01-03 VITALS — BP 129/72 | HR 78 | Ht 62.0 in | Wt 137.0 lb

## 2023-01-03 DIAGNOSIS — Z1211 Encounter for screening for malignant neoplasm of colon: Secondary | ICD-10-CM | POA: Diagnosis not present

## 2023-01-03 DIAGNOSIS — Z01419 Encounter for gynecological examination (general) (routine) without abnormal findings: Secondary | ICD-10-CM | POA: Diagnosis not present

## 2023-01-03 DIAGNOSIS — M79622 Pain in left upper arm: Secondary | ICD-10-CM

## 2023-01-03 DIAGNOSIS — F0390 Unspecified dementia without behavioral disturbance: Secondary | ICD-10-CM

## 2023-01-03 LAB — HEMOCCULT GUIAC POC 1CARD (OFFICE): Fecal Occult Blood, POC: NEGATIVE

## 2023-01-03 NOTE — Progress Notes (Signed)
Patient ID: Amy Stein, female   DOB: 03-Mar-1934, 89 y.o.   MRN: [id number] History of Present Illness: Amy Stein is a 89 year old black female, single, PM in for a well woman gyn exam.  She no longer needs paps. She says left arm hurts on and off and she put on salonpas patch.  PCP is Dr Felecia Shelling   Current Medications, Allergies, Past Medical History, Past Surgical History, Family History and Social History were reviewed in [employer] [facility] electronic medical record.     Review of Systems: Patient denies any headaches, hearing loss, fatigue, blurred vision, shortness of breath, chest pain, abdominal pain, problems with urination(she wears depends), or intercourse(not active). No joint pain or mood swings.  She has constipation at times and takes dulcolax.  Denies any vaginal bleeding   Physical Exam:BP 129/72 (BP Location: Right Arm, Patient Position: Sitting, Cuff Size: Normal)   Pulse 78   Ht 5\' 2"  (1.575 m)   Wt 137 lb (62.1 kg)   BMI 25.06 kg/m   General:  Well developed, well nourished, no acute distress Skin:  Warm and dry Neck:  Midline trachea, normal thyroid, good ROM, no lymphadenopathy,no carotid bruits heard Lungs; Clear to auscultation bilaterally Breast:  No dominant palpable mass, retraction, or nipple discharge Cardiovascular: Regular rate and rhythm Abdomen:  Soft, non tender, no hepatosplenomegaly Pelvic:  External genitalia is normal in appearance, no lesions.  The vagina is pale. Urethra has no lesions or masses. The cervix is smooth.  Uterus is felt to be normal size, shape, and contour.  No adnexal masses or tenderness noted.Bladder is non tender, no masses felt. Rectal: Good sphincter tone, no polyps, or hemorrhoids felt.  Hemoccult negative. Extremities/musculoskeletal:  No swelling or varicosities noted, no clubbing or cyanosis Psych:  No mood changes, alert and cooperative,seems happy AA is 0 Fall risk is moderate, uses cane and has PCA with her.    01/03/2023    10:25 AM 12/16/2021   10:22 AM 08/15/2020    8:46 AM  Depression screen PHQ 2/9  Decreased Interest 0 0 1  Down, Depressed, Hopeless 0 0 0  PHQ - 2 Score 0 0 1  Altered sleeping 0 0 0  Tired, decreased energy 1 0 0  Change in appetite 0 0 0  Feeling bad or failure about yourself  0 0 0  Trouble concentrating 0 0 0  Moving slowly or fidgety/restless 0 0 0  Suicidal thoughts 0 0 0  PHQ-9 Score 1 0 1       01/03/2023   10:25 AM 12/16/2021   10:22 AM 08/15/2020    8:46 AM  GAD 7 : Generalized Anxiety Score  Nervous, Anxious, on Edge 0 0 0  Control/stop worrying 0 0 0  Worry too much - different things 0 0 0  Trouble relaxing 0 0 0  Restless 0 0 0  Easily annoyed or irritable 0 0 0  Afraid - awful might happen 0 0 0  Total GAD 7 Score 0 0 0      Upstream - 01/03/23 1023       Pregnancy Intention Screening   Does the patient want to become pregnant in the next year? N/A    Does the patient's partner want to become pregnant in the next year? N/A    Would the patient like to discuss contraceptive options today? N/A      Contraception Wrap Up   Current Method Abstinence   PM   End Method Abstinence  PM   Contraception Counseling Provided No            Examination chaperoned by Malachy Mood LPN   Impression and Plan: 1. Encounter for well woman exam with routine gynecological exam Physical with PCP No pap needed  Mammogram was negative 02/18/22 Labs with PCP Follow up prn   2. Encounter for screening fecal occult blood testing Hemoccult was negative  - POCT occult blood stool  3. Left upper arm pain Has pain on and off, wears salonpas patch Try warm towel when achy No swelling or tenderness   4. Dementia, unspecified dementia severity, unspecified dementia type, unspecified whether behavioral, psychotic, or mood disturbance or anxiety (HCC) Has seen neurology

## 2023-04-21 ENCOUNTER — Emergency Department: Admit: 2023-04-22 | Payer: MEDICARE

## 2023-04-21 DIAGNOSIS — J101 Influenza due to other identified influenza virus with other respiratory manifestations: Secondary | ICD-10-CM

## 2023-04-21 NOTE — Discharge Instructions (Signed)
Rest and encourage fluids  Tylenol as needed for fever  Prescription for Tamiflu was sent to pharmacy on rec  Return at any time worsening or new concerns.  Follow-up if not improving over the next 2 to 3 days

## 2023-04-21 NOTE — ED Triage Notes (Signed)
Pt arrived via EMS c/o cough and no appetite. Pt has hx of dementia and DM.

## 2023-04-21 NOTE — ED Notes (Signed)
Other:  Person identifying self as daughter and caregiver, Harvey Kamer, calls for clarification on medication. Identifiers verified. Daughter states medication is supposed to be twice a day per what she looked up. I conferred with Ginnie Smart, Rph who states this is a renal dosage of one tablet once a day for four days.  I called daughter back and explained the reason being her mother's kidney function. I reiterated that the dose is one tablet once a day for four days. Daughter states understanding and has no further concerns. I invited her to call back should any other concerns arise regarding her mother's ED visit.     Heloise Beecham  04/22/23 1554

## 2023-04-21 NOTE — ED Provider Notes (Signed)
[facility]  Emergency Department Treatment Report        Patient: Sharon Roman Age: 89 y.o. Sex:  female    Date of Birth: 09/09/1933 Admit Date: 04/21/2023 PCP: No primary care provider on file.   MRN: [id number]  CSN: [id number]  APP: Fara Chute, PA-C    Room: H02/H02 Time Dictated: 11:54 PM Attending Parks [place]une REQUIRED    Chief Complaint   Chief Complaint   Patient presents with    Cough       History of Present Illness   This is a 89 y.o. female who presents with her daughter for evaluation of weakness and not feeling well.  Her daughter states that she has been sick with the flu over the past week she kept her self separate in the house or mask patient was doing well however today she seemed to be sleeping a lot she got her meds this morning ate a little bit and then wanted to go back to sleep when she checked on her this afternoon she felt warm like she had a fever and then they noticed a cough.  Then she noticed when she went to stand her up her legs were so weak that they would not hold her.  No vomiting no diarrhea patient denies any chest pain or shortness of breath or abdominal pain.  She not traveled anywhere recently.  Has not had any Tylenol or ibuprofen for fever.  There was no fall or injury.     INDEPENDENT HISTORIAN: Daughter as above    Review of Systems   Review of Systems   Constitutional:  Positive for fever.        Generalized weakness legs feel weak   HENT:  Negative for sore throat.    Eyes:  Negative for visual disturbance.   Respiratory:  Positive for cough. Negative for shortness of breath.    Cardiovascular:  Negative for chest pain, palpitations and leg swelling.   Gastrointestinal:  Negative for abdominal pain, diarrhea, nausea and vomiting.   Genitourinary:  Negative for dysuria.   Musculoskeletal:  Negative for myalgias.   Skin:  Negative for rash.   Neurological:  Negative for numbness and headaches.   All other systems reviewed and are  negative.        Past Medical/Surgical History   No past medical history on file.  No past surgical history on file.  Hypertension diabetes dementia chronic kidney disease-daughter states she has been told that the patient needs dialysis but they have not been interested due to the patient's advanced age  Social History     Visiting from [place] no tobacco or alcohol    Social History     Socioeconomic History    Marital status: Single     Spouse name: Not on file    Number of children: Not on file    Years of education: Not on file    Highest education level: Not on file   Occupational History    Not on file   Tobacco Use    Smoking status: Not on file    Smokeless tobacco: Not on file   Substance and Sexual Activity    Alcohol use: Not on file    Drug use: Not on file    Sexual activity: Not on file   Other Topics Concern    Not on file   Social History Narrative    Not on file  Social Determinants of Health     Financial Resource Strain: Low Risk  (01/03/2023)    Received from [facility]    Overall Financial Resource Strain (CARDIA)     Difficulty of Paying Living Expenses: Not hard at all   Food Insecurity: No Food Insecurity (01/03/2023)    Received from [facility]    Hunger Vital Sign     Worried About Running Out of Food in the Last Year: Never true     Ran Out of Food in the Last Year: Never true   Transportation Needs: No Transportation Needs (01/03/2023)    Received from [facility] - Transportation     Lack of Transportation (Medical): No     Lack of Transportation (Non-Medical): No   Physical Activity: Insufficiently Active (01/03/2023)    Received from [facility]    Exercise Vital Sign     Days of Exercise per Week: 2 days     Minutes of Exercise per Session: 20 min   Stress: No Stress Concern Present (01/03/2023)    Received from [facility] - Occupational Stress Questionnaire     Feeling of Stress : Not at all   Social Connections: Unknown  (01/03/2023)    Received from [facility]    Social Connection and Isolation Panel [NHANES]     Frequency of Communication with Friends and Family: More than three times a week     Frequency of Social Gatherings with Friends and Family: More than three times a week     Attends Religious Services: Patient declined     Database administrator or Organizations: No     Attends Banker Meetings: Never     Marital Status: Patient declined   Intimate Partner Violence: Not At Risk (01/03/2023)    Received from [facility]    Humiliation, Afraid, Rape, and Kick questionnaire     Fear of Current or Ex-Partner: No     Emotionally Abused: No     Physically Abused: No     Sexually Abused: No   Housing Stability: Not on file           Family History   No family history on file.      Current Medications     Current Facility-Administered Medications   Medication Dose Route Frequency Provider Last Rate Last Admin    oseltamivir (TAMIFLU) capsule 30 mg  30 mg Oral Daily Fara Chute, PA-C         Current Outpatient Medications   Medication Sig Dispense Refill    oseltamivir (TAMIFLU) 30 MG capsule Take 1 capsule by mouth daily for 4 days 4 capsule 0       Allergies     Allergies   Allergen Reactions    Sulfa Antibiotics Rash       Physical Exam   Patient Vitals for the past 24 hrs:   Temp Pulse Resp BP SpO2   04/21/23 2245 98.8 F (37.1 C) 78 18 114/75 95 %   04/21/23 1914 (!) 100.8 F (38.2 C) 90 18 (!) 175/84 98 %     Physical Exam  Vitals and nursing note reviewed.   Constitutional:       Comments: Very pleasant nontoxic   HENT:      Mouth/Throat:      Pharynx: Oropharynx is clear.   Eyes:      Conjunctiva/sclera: Conjunctivae normal.  Pupils: Pupils are equal, round, and reactive to light.   Cardiovascular:      Rate and Rhythm: Normal rate and regular rhythm.   Pulmonary:      Breath sounds: Normal breath sounds.   Abdominal:      Palpations: Abdomen is soft.      Tenderness: There is no abdominal tenderness.    Musculoskeletal:      Cervical back: Neck supple. No tenderness.      Comments: Back is nontender no midline tenderness to palpation thoracic lumbar sacral spines hips pelvis intact nontender extremities warm dry well-perfused nontender symmetrical   Skin:     General: Skin is warm and dry.      Capillary Refill: Capillary refill takes less than 2 seconds.   Neurological:      Mental Status: She is alert.      Comments: Speech is intact no facial droop DTRs intact strength testing is intact able to lift both legs off the stretcher distal sensation is intact she is awake alert oriented to person and place          Impression and Management Plan   89 year old female brought in by family with concerns of weakness and cough that started here.  Daughter just had influenza.  At this time we will start an IV with her weakness and trouble standing due to the weakness in her legs she was febrile in triage and the cough certainly need to be evaluated for acute infectious etiology baseline lab work including CBC CMP a set of blood cultures lactic acid Pro-Cal urine chest x-ray EKG urine and COVID influenza RSV swab will be obtained.  I do not think we need advanced imaging at this time there is no head injury no fall no headache she has an intact nonfocal neurologic exam.      Differential diagnoses: Viral etiology COVID influenza RSV, bacteremia, UTI pneumonia electrolyte derangement acute on chronic kidney disease arrhythmia dehydration    Personal Equipment worn: N95 mask   Diagnostic Studies   Lab:   Results for orders placed or performed during the hospital encounter of 04/21/23   COVID-19, Flu A/B, and RSV Combo    Specimen: Nasopharyngeal Swab   Result Value Ref Range    SARS-CoV-2 NEGATIVE NEGATIVE      Influenza A PCR POSITIVE (A) NEGATIVE      Influenza virus B RNA NEGATIVE NEGATIVE      RSV A/B PCR NEGATIVE NEGATIVE     CBC with Auto Differential   Result Value Ref Range    WBC 6.5 4.0 - 11.0 1000/mm3    RBC 4.37  3.60 - 5.20 M/uL    Hemoglobin 12.3 11.0 - 16.0 gm/dl    Hematocrit 16.1 09.6 - 47.0 %    MCV 88.6 80.0 - 98.0 fL    MCH 28.1 25.4 - 34.6 pg    MCHC 31.8 30.0 - 36.0 gm/dl    Platelets 045 (L) 409 - 450 1000/mm3    MPV 10.0 6.0 - 10.0 fL    RDW 44.5 36.4 - 46.3      Nucleated RBCs 0 0 - 0      Immature Granulocytes % 0.5 0.0 - 3.0 %    Neutrophils Segmented 79.2 (H) 34 - 64 %    Lymphocytes 9.0 (L) 28 - 48 %    Monocytes 10.4 1 - 13 %    Eosinophils 0.6 0 - 5 %    Basophils 0.3 0 - 3 %  Basic Metabolic Panel   Result Value Ref Range    Potassium 4.0 3.5 - 5.1 mEq/L    Chloride 102 98 - 107 mEq/L    Sodium 137 136 - 145 mEq/L    CO2 26 20 - 31 mEq/L    Glucose 120 (H) 74 - 106 mg/dl    BUN 18 9 - 23 mg/dl    Creatinine 8.41 (H) 0.55 - 1.02 mg/dl    GFR African American 44.0      GFR Non-African American 37      Calcium 9.5 8.7 - 10.4 mg/dl    Anion Gap 9 5 - 15 mmol/L   Lactate, Sepsis   Result Value Ref Range    Lactate 1.3 0.5 - 2.2 mmol/L   Lactate, Sepsis   Result Value Ref Range    Lactate 0.8 0.5 - 2.2 mmol/L   Procalcitonin   Result Value Ref Range    Procalcitonin <0.05 0.00 - 0.50 ng/ml   Hepatic Function Panel   Result Value Ref Range    AST 37.0 (H) 0.0 - 33.9 U/L    ALT 43 10 - 49 U/L    Alkaline Phosphatase 86 46 - 116 U/L    Total Bilirubin 0.40 0.30 - 1.20 mg/dl    Total Protein 7.9 5.7 - 8.2 gm/dl    Albumin 3.8 3.4 - 5.0 gm/dl    Bilirubin, Direct 0.1 0.0 - 0.3 mg/dl     Imaging:    XR CHEST (2 VW)    Result Date: 04/21/2023  XR CHEST (2 VW) HISTORY: cough and fever. COMPARISON: 03/07/2022. FINDINGS: Cardiac silhouette is unremarkable. There is no pneumothorax or pleural effusion. Left hemidiaphragm elevated similar to the prior study. Linear subsegmental question minimal consolidation right. Calcified aorta. Thoracic degenerative changes.     IMPRESSION: Right basilar subsegmental atelectasis or scarring. Elevated left hemidiaphragm. Electronically signed by: Layla Maw, MD 04/21/2023 8:05 PM EST           Workstation ID: [id number]      EKG: Sinus rhythm with a rate of 90 PR 130 QRS of 120 a QT of 366 and QTc of 447 right bundle branch block seen.  I cannot see an old EKG but there is note in her record dated 07/27/2021 outside facility of the right bundle branch block being present.  No definitive evidence to suggest acute ischemia as interpreted by J Ritik Stavola C Cadan Maggart PA-C.  Reviewed with Dr. Cristina Gong    Cardiac Monitor Interpretation: Was obtained with the weakness and shows patient's remained in sinus rhythm with a pulse in the 70s during her stay in the emergency department interpreted by J Averyana Pillars C Demetrica Zipp PA-C    Imaging: 2 view chest x-ray no definite infiltrate seen as interpreted by J Noal Abshier C Nichalos Brenton PA-C.    Procedure   Procedures    ED Course         RECORDS REVIEWED:  I reviewed the patient's previous records here at [facility] and available outside facilities and note that outpatient records show patient's been seen for memory impairment with moderate dementia type 2 diabetes with stage III chronic kidney disease    EXTERNAL RESULTS REVIEWED: Renal function from 03/08/2022 showed a BUN and creatinine of 18 and 1.18    Severe exacerbation or progression of chronic illness: Acute undiagnosed medical condition    Threat to body function: Infectious, metabolic    SOCIAL DETERMINANTS  impacting Evaluation and Management: Access to care after hours  Comorbidities impacting Evaluation and Management: Diabetes hypertension chronic kidney disease advanced age influenza exposure    Critical Care Time (if necessary)     Medications   oseltamivir (TAMIFLU) capsule 30 mg (has no administration in time range)   acetaminophen (OFIRMEV) infusion 1,000 mg (0 mg IntraVENous Stopped 04/21/23 2225)   sodium chloride 0.9 % bolus 250 mL (0 mLs IntraVENous Stopped 04/21/23 2225)       NARRATIVE:      Medical Decision Making   At this time findings reviewed.  CBC showed a normal white count of 6.5 H&H normal platelets  are normal no evidence of anemia.  BMP showed a creatinine of 1.44 old values that I can find from outpatient records show a prior value of 1.6 and this would be consistent with her history of chronic stage IIIb kidney disease.  Lactic acid was normal x 2 Pro-Cal was negative.  LFTs were normal other than minimally elevated AST of 37.    COVID influenza RSV positive for influenza A.    EKG showed a sinus rhythm with a right bundle branch block which was not new    Chest x-ray did not show any evidence of definite pneumonia    Patient had urinated in her diaper.  Discussed getting a straight cath patient's daughter would prefer to see if patient could provide a specimen on the bedpan.  We are still waiting for this urine however patient does have an etiology for her symptoms including influenza A.     Patient's daughter would like to take the patient home and feels that she can care for her at home with her brother's assistance.  I do not think this is unreasonable.  Patient's remained awake alert nontoxic.  She is a candidate for Tamiflu as she is high risk for complications from the flu.  We did dose the patient's Tamiflu based on on her renal function and this was verified with pharmacy.  This was sent to pharmacy on record with the first dose given here.  Continue with Tylenol for fever body aches if needed push fluids and certainly if there is any worsening new concerns the patient is to return and the patient and daughter do verbalize he understand that.  I did offer to admit for the weakness however patient and daughter would like to go home.        Final Diagnosis       ICD-10-CM    1. Influenza A  J10.1       2. Acute cough  R05.1       3. Generalized weakness  R53.1           Disposition   Discharged home in stable condition to follow-up above.  Patient was examined and  evaluated by myself and Dr. Cristina Gong who agrees with assessment and plan.    Fara Chute, PA-C   April 21, 2023    My  signature above authenticates this document and my orders, the final    diagnosis (es), discharge prescription (s), and instructions in the Epic    record.  If you have any questions please contact [phone].     Nursing notes have been reviewed by the physician/ advanced practice    Clinician.                             Fara Chute, [place]  04/21/23 2354

## 2023-04-22 ENCOUNTER — Inpatient Hospital Stay
Admit: 2023-04-22 | Discharge: 2023-04-22 | Disposition: A | Discharge status: Home / Self Care | Payer: MEDICARE | Attending: Emergency Medicine

## 2023-04-22 LAB — EKG 12-LEAD
Atrial Rate: 90 {beats}/min
Calculated P Axis: 54 degrees
Calculated R Axis: -13 degrees
Calculated T Axis: 23 degrees
P-R Interval: 130 ms
Q-T Interval: 366 ms
QRS Duration: 120 ms
QTC Calculation (Bezet): 447 ms
Ventricular Rate: 90 {beats}/min

## 2023-04-22 LAB — PROCALCITONIN: Procalcitonin: <0.05 ng/mL (ref 0.00–0.50)

## 2023-04-22 LAB — CBC WITH AUTO DIFFERENTIAL
Basophils: 0.3 % (ref 0–3)
Eosinophils: 0.6 % (ref 0–5)
Hematocrit: 38.7 % (ref 35.0–47.0)
Hemoglobin: 12.3 g/dL (ref 11.0–16.0)
Immature Granulocytes %: 0.5 % (ref 0.0–3.0)
Lymphocytes: 9 % — ABNORMAL LOW (ref 28–48)
MCH: 28.1 pg (ref 25.4–34.6)
MCHC: 31.8 g/dL (ref 30.0–36.0)
MCV: 88.6 fL (ref 80.0–98.0)
MPV: 10 fL (ref 6.0–10.0)
Monocytes: 10.4 % (ref 1–13)
Neutrophils Segmented: 79.2 % — ABNORMAL HIGH (ref 34–64)
Nucleated RBCs: 0 (ref 0–0)
Platelets: 126 10*3/uL — ABNORMAL LOW (ref 140–450)
RBC: 4.37 M/uL (ref 3.60–5.20)
RDW: 44.5 (ref 36.4–46.3)
WBC: 6.5 10*3/uL (ref 4.0–11.0)

## 2023-04-22 LAB — HEPATIC FUNCTION PANEL
ALT: 43 U/L (ref 10–49)
AST: 37 U/L — ABNORMAL HIGH (ref 0.0–33.9)
Albumin: 3.8 g/dL (ref 3.4–5.0)
Alkaline Phosphatase: 86 U/L (ref 46–116)
Bilirubin, Direct: 0.1 mg/dL (ref 0.0–0.3)
Total Bilirubin: 0.4 mg/dL (ref 0.30–1.20)
Total Protein: 7.9 g/dL (ref 5.7–8.2)

## 2023-04-22 LAB — LACTATE, SEPSIS
Lactate: 1.3 mmol/L (ref 0.5–2.2)
Lactate: 0.8 mmol/L (ref 0.5–2.2)

## 2023-04-22 LAB — BASIC METABOLIC PANEL
Anion Gap: 9 mmol/L (ref 5–15)
BUN: 18 mg/dL (ref 9–23)
CO2: 26 meq/L (ref 20–31)
Calcium: 9.5 mg/dL (ref 8.7–10.4)
Chloride: 102 meq/L (ref 98–107)
Creatinine: 1.44 mg/dL — ABNORMAL HIGH (ref 0.55–1.02)
GFR African American: 44
GFR Non-African American: 37
Glucose: 120 mg/dL — ABNORMAL HIGH (ref 74–106)
Potassium: 4 meq/L (ref 3.5–5.1)
Sodium: 137 meq/L (ref 136–145)

## 2023-04-22 LAB — COVID-19, FLU A/B, AND RSV COMBO
Influenza A PCR: POSITIVE — AB
Influenza virus B RNA: NEGATIVE
RSV A/B PCR: NEGATIVE
SARS-CoV-2: NEGATIVE

## 2023-04-22 MED ORDER — ACETAMINOPHEN 10 MG/ML IV SOLN
10 | Freq: Once | INTRAVENOUS | Status: AC
Start: 2023-04-22 — End: 2023-04-21
  Administered 2023-04-22: 02:00:00 1000 mg via INTRAVENOUS

## 2023-04-22 MED ORDER — OSELTAMIVIR PHOSPHATE 30 MG PO CAPS
30 | Freq: Every day | ORAL | Status: DC
Start: 2023-04-22 — End: 2023-04-22
  Administered 2023-04-22: 05:00:00 30 mg via ORAL

## 2023-04-22 MED ORDER — SODIUM CHLORIDE 0.9 % IV BOLUS
0.9 | Freq: Once | INTRAVENOUS | Status: AC
Start: 2023-04-22 — End: 2023-04-21
  Administered 2023-04-22: 02:00:00 250 mL via INTRAVENOUS

## 2023-04-22 MED ORDER — OSELTAMIVIR PHOSPHATE 75 MG PO CAPS
75 | Freq: Once | ORAL | Status: DC
Start: 2023-04-22 — End: 2023-04-21

## 2023-04-22 MED ORDER — OSELTAMIVIR PHOSPHATE 30 MG PO CAPS
30 | ORAL_CAPSULE | Freq: Every day | ORAL | 0 refills | Status: AC
Start: 2023-04-22 — End: 2023-04-25

## 2023-04-22 MED FILL — OSELTAMIVIR PHOSPHATE 30 MG PO CAPS: 30 MG | ORAL | Qty: 1

## 2023-04-22 MED FILL — ACETAMINOPHEN 10 MG/ML IV SOLN: 10 MG/ML | INTRAVENOUS | Qty: 100

## 2023-04-22 NOTE — Progress Notes (Signed)
12:16 AM  04/22/23    Discharge instructions given to daughter with verbalization of understanding.  Pt accompanied by daughter.  Pt discharged with the following prescriptions      Medication List        START taking these medications      oseltamivir 30 MG capsule  Commonly known as: Tamiflu  Take 1 capsule by mouth daily for 4 days               Where to Get Your Medications        These medications were sent to [id number] [place], [place] - [address] - [phone] Carmon Ginsberg [phone]  [address] [place] [address]      Phone: [phone]   oseltamivir 30 MG capsule     .  Pt discharged to home.    Rhae Lerner, RN

## 2023-04-27 LAB — CULTURE, BLOOD 2: BLOOD CULTURE RESULT: NO GROWTH

## 2023-04-27 LAB — CULTURE, BLOOD 1: BLOOD CULTURE RESULT: NO GROWTH

## 2023-05-05 ENCOUNTER — Ambulatory Visit: Payer: 59 | Admitting: Nurse Practitioner

## 2023-05-05 DIAGNOSIS — I10 Essential (primary) hypertension: Secondary | ICD-10-CM

## 2023-05-05 DIAGNOSIS — E559 Vitamin D deficiency, unspecified: Secondary | ICD-10-CM

## 2023-05-05 DIAGNOSIS — Z7984 Long term (current) use of oral hypoglycemic drugs: Secondary | ICD-10-CM

## 2023-05-05 DIAGNOSIS — E782 Mixed hyperlipidemia: Secondary | ICD-10-CM

## 2023-05-05 DIAGNOSIS — E1122 Type 2 diabetes mellitus with diabetic chronic kidney disease: Secondary | ICD-10-CM

## 2023-05-17 ENCOUNTER — Other Ambulatory Visit: Payer: Self-pay | Admitting: Nurse Practitioner

## 2023-06-16 ENCOUNTER — Encounter: Admit: 2023-06-24 | Payer: Medicare (Managed Care)

## 2023-06-20 ENCOUNTER — Other Ambulatory Visit: Payer: Self-pay | Admitting: Nurse Practitioner

## 2023-06-21 NOTE — Telephone Encounter (Signed)
 Pt missed an appt in January and does not have a follow up appt scheduled.

## 2023-06-28 ENCOUNTER — Other Ambulatory Visit: Payer: Self-pay | Admitting: Nurse Practitioner

## 2023-06-29 NOTE — Telephone Encounter (Signed)
No, needs appt first.

## 2023-06-30 ENCOUNTER — Encounter: Admit: 2023-07-21 | Payer: MEDICARE | Primary: Family Medicine

## 2023-07-06 ENCOUNTER — Other Ambulatory Visit: Payer: Self-pay | Admitting: Nurse Practitioner

## 2023-07-07 NOTE — Telephone Encounter (Signed)
 No refills until she is seen in office.  We have denied this prescription several times via Epic and via fax request.

## 2023-07-26 ENCOUNTER — Encounter: Admit: 2023-08-29 | Discharge: 2023-08-29 | Payer: Medicare (Managed Care) | Primary: Family Medicine

## 2023-08-01 ENCOUNTER — Other Ambulatory Visit: Payer: Self-pay | Admitting: Nurse Practitioner

## 2023-08-03 NOTE — Telephone Encounter (Signed)
 Havent seen this patient in nearly a year, no refills until seen.

## 2023-09-02 ENCOUNTER — Encounter: Admit: 2023-09-29 | Payer: Medicare (Managed Care)

## 2023-10-03 ENCOUNTER — Inpatient Hospital Stay: Admit: 2023-10-04 | Payer: Medicare (Managed Care) | Primary: Family Medicine

## 2023-11-28 ENCOUNTER — Emergency Department: Admit: 2023-11-28 | Payer: Medicare (Managed Care) | Primary: Family Medicine

## 2023-11-28 ENCOUNTER — Inpatient Hospital Stay
Admit: 2023-11-28 | Discharge: 2023-11-30 | Disposition: A | Discharge status: Home / Self Care | Payer: Medicare (Managed Care) | Attending: Internal Medicine | Admitting: Internal Medicine

## 2023-11-28 DIAGNOSIS — R404 Transient alteration of awareness: Secondary | ICD-10-CM

## 2023-11-28 DIAGNOSIS — G459 Transient cerebral ischemic attack, unspecified: Principal | ICD-10-CM

## 2023-11-28 LAB — CBC WITH AUTO DIFFERENTIAL
Basophils: 0.7 % (ref 0–3)
Eosinophils: 6.8 % — ABNORMAL HIGH (ref 0–5)
Hematocrit: 36.2 % (ref 35.0–47.0)
Hemoglobin: 11.4 g/dL (ref 11.0–16.0)
Immature Granulocytes %: 0.5 % (ref 0.0–3.0)
Lymphocytes: 22.8 % — ABNORMAL LOW (ref 28–48)
MCH: 28.4 pg (ref 25.4–34.6)
MCHC: 31.5 g/dL (ref 30.0–36.0)
MCV: 90.3 fL (ref 80.0–98.0)
MPV: 10.5 fL — ABNORMAL HIGH (ref 6.0–10.0)
Monocytes: 8.2 % (ref 1–13)
Neutrophils Segmented: 61 % (ref 34–64)
Nucleated RBCs: 0 (ref 0–0)
Platelets: 147 1000/mm3 (ref 140–450)
RBC: 4.01 M/uL (ref 3.60–5.20)
RDW: 45.8 (ref 36.4–46.3)
WBC: 5.6 1000/mm3 (ref 4.0–11.0)

## 2023-11-28 LAB — COMPREHENSIVE METABOLIC PANEL
ALT: 326 U/L — ABNORMAL HIGH (ref 10–49)
AST: 166 U/L — ABNORMAL HIGH (ref 0.0–33.9)
Albumin: 3.3 g/dL — ABNORMAL LOW (ref 3.4–5.0)
Alkaline Phosphatase: 110 U/L (ref 46–116)
Anion Gap: 10 mmol/L (ref 5–15)
BUN: 17 mg/dL (ref 9–23)
CO2: 28 meq/L (ref 20–31)
Calcium: 9.5 mg/dL (ref 8.7–10.4)
Chloride: 104 meq/L (ref 98–107)
Creatinine: 1.28 mg/dL — ABNORMAL HIGH (ref 0.55–1.02)
GFR African American: 51
GFR Non-African American: 42
Glucose: 183 mg/dL — ABNORMAL HIGH (ref 74–106)
Potassium: 3.5 meq/L (ref 3.5–5.1)
Sodium: 142 meq/L (ref 136–145)
Total Bilirubin: 0.5 mg/dL (ref 0.30–1.20)
Total Protein: 7.2 g/dL (ref 5.7–8.2)

## 2023-11-28 LAB — APTT: aPTT: 25.2 s (ref 25.1–36.5)

## 2023-11-28 LAB — TROPONIN: Troponin, High Sensitivity: 66 ng/L — ABNORMAL HIGH (ref 0–34)

## 2023-11-28 LAB — PROTIME-INR
INR: 1.1 (ref 0.1–1.1)
Protime: 12.3 s (ref 10.2–12.9)

## 2023-11-28 MED ORDER — ONDANSETRON HCL 4 MG/2ML IJ SOLN
4 | Freq: Four times a day (QID) | INTRAMUSCULAR | Status: AC | PRN
Start: 2023-11-28 — End: ?

## 2023-11-28 MED ORDER — AMLODIPINE BESYLATE 5 MG PO TABS
5 | Freq: Every day | ORAL | Status: AC
Start: 2023-11-28 — End: ?

## 2023-11-28 MED ORDER — NORMAL SALINE FLUSH 0.9 % IV SOLN
0.9 | Freq: Two times a day (BID) | INTRAVENOUS | Status: AC
Start: 2023-11-28 — End: ?
  Administered 2023-11-29 – 2023-11-30 (×4): 10 mL via INTRAVENOUS

## 2023-11-28 MED ORDER — LOSARTAN POTASSIUM 50 MG PO TABS
50 | Freq: Every day | ORAL | Status: AC
Start: 2023-11-28 — End: ?

## 2023-11-28 MED ORDER — IOPAMIDOL 61 % IV SOLN
61 | Freq: Once | INTRAVENOUS | Status: AC | PRN
Start: 2023-11-28 — End: 2023-11-28
  Administered 2023-11-28: 20:00:00 105 mL via INTRAVENOUS

## 2023-11-28 MED ORDER — NORMAL SALINE FLUSH 0.9 % IV SOLN
0.9 | INTRAVENOUS | Status: AC | PRN
Start: 2023-11-28 — End: ?

## 2023-11-28 MED ORDER — ENOXAPARIN SODIUM 30 MG/0.3ML IJ SOSY
30 | Freq: Every day | INTRAMUSCULAR | Status: AC
Start: 2023-11-28 — End: ?
  Administered 2023-11-29: 13:00:00 30 mg via SUBCUTANEOUS

## 2023-11-28 MED ORDER — VITAMIN D 25 MCG (1000 UT) PO TABS
25 | Freq: Every morning | ORAL | Status: AC
Start: 2023-11-28 — End: ?
  Administered 2023-11-29 – 2023-11-30 (×2): 1000 [IU] via ORAL

## 2023-11-28 MED ORDER — SODIUM CHLORIDE 0.9 % IV SOLN
0.9 | INTRAVENOUS | Status: AC | PRN
Start: 2023-11-28 — End: ?

## 2023-11-28 MED ORDER — SODIUM CHLORIDE 0.9 % IV BOLUS
0.9 | Freq: Once | INTRAVENOUS | Status: AC
Start: 2023-11-28 — End: 2023-11-28
  Administered 2023-11-29: 01:00:00 500 mL via INTRAVENOUS

## 2023-11-28 MED ORDER — SODIUM CHLORIDE 0.9 % IV SOLN (MINI-BAG)
0.9 | Freq: Two times a day (BID) | INTRAVENOUS | Status: AC
Start: 2023-11-28 — End: 2023-12-03
  Administered 2023-11-29 – 2023-11-30 (×4): 100 mg via INTRAVENOUS

## 2023-11-28 MED ORDER — ACETAMINOPHEN 650 MG RE SUPP
650 | RECTAL | Status: AC | PRN
Start: 2023-11-28 — End: ?

## 2023-11-28 MED ORDER — THERAPEUTIC MULTIVIT/MINERAL PO TABS
Freq: Every day | ORAL | Status: AC
Start: 2023-11-28 — End: ?
  Administered 2023-11-29 – 2023-11-30 (×2): 1 via ORAL

## 2023-11-28 MED ORDER — PANTOPRAZOLE SODIUM 40 MG PO TBEC
40 | Freq: Every day | ORAL | Status: AC
Start: 2023-11-28 — End: ?
  Administered 2023-11-29 – 2023-11-30 (×2): 40 mg via ORAL

## 2023-11-28 MED ORDER — ATORVASTATIN CALCIUM 40 MG PO TABS
40 | Freq: Every evening | ORAL | Status: AC
Start: 2023-11-28 — End: ?
  Administered 2023-11-29 – 2023-11-30 (×2): 80 mg via ORAL

## 2023-11-28 MED ORDER — OMEGA-3-ACID ETHYL ESTERS 1 G PO CAPS
1 | Freq: Every day | ORAL | Status: AC
Start: 2023-11-28 — End: ?
  Administered 2023-11-29 – 2023-11-30 (×2): 1 via ORAL

## 2023-11-28 MED ORDER — ASPIRIN 81 MG PO TBEC
81 | Freq: Every day | ORAL | Status: AC
Start: 2023-11-28 — End: ?
  Administered 2023-11-29 – 2023-11-30 (×2): 81 mg via ORAL

## 2023-11-28 MED ORDER — ONDANSETRON 4 MG PO TBDP
4 | Freq: Three times a day (TID) | ORAL | Status: AC | PRN
Start: 2023-11-28 — End: ?

## 2023-11-28 MED ORDER — CEFTRIAXONE SODIUM 1 G IJ SOLR
1 | INTRAMUSCULAR | Status: AC
Start: 2023-11-28 — End: 2023-12-03
  Administered 2023-11-29 (×2): 1000 mg via INTRAVENOUS

## 2023-11-28 MED ORDER — GABAPENTIN 400 MG PO CAPS
400 | Freq: Every morning | ORAL | Status: AC
Start: 2023-11-28 — End: ?
  Administered 2023-11-29 – 2023-11-30 (×2): 400 mg via ORAL

## 2023-11-28 MED ORDER — CLOPIDOGREL BISULFATE 75 MG PO TABS
75 | Freq: Every day | ORAL | Status: AC
Start: 2023-11-28 — End: ?
  Administered 2023-11-29 – 2023-11-30 (×2): 75 mg via ORAL

## 2023-11-28 MED ORDER — CLOPIDOGREL BISULFATE 75 MG PO TABS
75 | Freq: Once | ORAL | Status: AC
Start: 2023-11-28 — End: 2023-11-28
  Administered 2023-11-28: 21:00:00 300 mg via ORAL

## 2023-11-28 MED ORDER — ASPIRIN 325 MG PO TABS
325 | Freq: Once | ORAL | Status: AC
Start: 2023-11-28 — End: 2023-11-28
  Administered 2023-11-28: 21:00:00 325 mg via ORAL

## 2023-11-28 MED ORDER — POLYETHYLENE GLYCOL 3350 17 G PO PACK
17 | Freq: Every day | ORAL | Status: AC | PRN
Start: 2023-11-28 — End: ?
  Administered 2023-11-29: 13:00:00 17 g via ORAL

## 2023-11-28 MED ORDER — OXYBUTYNIN CHLORIDE ER 5 MG PO TB24
5 | Freq: Every day | ORAL | Status: AC
Start: 2023-11-28 — End: ?
  Administered 2023-11-29: 13:00:00 10 mg via ORAL

## 2023-11-28 MED ORDER — VITAMIN B-12 500 MCG PO TABS
500 | Freq: Every day | ORAL | Status: AC
Start: 2023-11-28 — End: ?
  Administered 2023-11-29 – 2023-11-30 (×2): 500 ug via ORAL

## 2023-11-28 MED ORDER — ACETAMINOPHEN 325 MG PO TABS
325 | ORAL | Status: AC | PRN
Start: 2023-11-28 — End: ?

## 2023-11-28 MED FILL — ISOVUE-300 61 % IV SOLN: 61 % | INTRAVENOUS | Qty: 105

## 2023-11-28 MED FILL — SODIUM CHLORIDE 0.9 % IV SOLN: 0.9 % | INTRAVENOUS | Qty: 500

## 2023-11-28 MED FILL — ASPIRIN 325 MG PO TABS: 325 mg | ORAL | Qty: 1

## 2023-11-28 MED FILL — OMEGA-3-ACID ETHYL ESTERS 1 G PO CAPS: 1 g | ORAL | Qty: 1

## 2023-11-28 MED FILL — CLOPIDOGREL BISULFATE 75 MG PO TABS: 75 mg | ORAL | Qty: 4

## 2023-11-28 NOTE — ED Notes (Signed)
 TRANSFER - OUT REPORT:    Verbal report given to [name], RN on Sharon Roman being transferred to [facility] for routine progression of patient care       Report consisted of patient's Situation, Background, Assessment and   Recommendations(SBAR).     Information from the following report(s) Nurse Handoff Report was reviewed with the receiving nurse.  Kinder Assessment: Presents to emergency department  because of falls (Syncope, seizure, or loss of consciousness): No, Age > 70: Yes, Altered Mental Status, Intoxication with alcohol or substance confusion (Disorientation, impaired judgment, poor safety awaremess, or inability to follow instructions): Yes, Impaired Mobility: Ambulates or transfers with assistive devices or assistance; Unable to ambulate or transer.: Yes, Nursing Judgement: Yes  Lines:   Peripheral IV 11/28/23 Right Antecubital (Active)       Peripheral IV 11/28/23 Left Antecubital (Active)        Medications sent with patient from pharmacy and placed in tamper-evident security bag: None to send  Patient belongings: Belongings sent to floor    Opportunity for questions and clarification was provided.      Patient transported with:  Registered Nurse          Sharon Harlene SQUIBB, RN  11/28/23 [phone]

## 2023-11-28 NOTE — H&P (Signed)
 Admission History and Physical  Arlean Sheffield D.O.     Patient: Sharon Roman Age: 89 y.o. Sex: female    Date of Birth: [birth year] Admit Date: 11/28/2023 Admit Doctor: Arlean Sheffield, DO   MRN: [id number]  CSN: [id number]  PCP: No primary care provider on file.       Assessment / Plan     Left-sided weakness TIA versus CVA  Left lower lobe pneumonia, POA  Transaminitis, unclear etiology  Hypertension  Hyperlipidemia  CKD stage II  NIDDM 2 uncontrolled with hyperglycemia  GERD  Dementia, AAO x 1 baseline      Plan:  - Admit to telemetry neurofloor for neurochecks every 4 hours  - Loaded with aspirin  Plavix  in ED will continue with dual antiplatelet for next 21 days  - Increase home statin from simvastatin to atorvastatin  80 mg nightly for high intensity statin therapy (monitor LFTs)  - Stroke coordinator consulted in ED, PT OT speech input appreciated.  A1c and lipid panel per stroke protocol ordered  - Easy to chew/dysphagia diet, patient with poor dentition at baseline  - Permissive hypertension no plans to treat unless above 220/110, holding amlodipine  and losartan  for this reason  - Noted radiology concern of PNA left lower lobe: Chest x-ray concerning for dense left lower lobe pneumonia, possible bronchogenic pneumonia.  Patient has low-grade temp 99.4 in ED, white count is normal family does not endorse a cough.  She is certainly at risk of aspiration due to her poor dentition and her dementia.  Will start antibiotic coverage with ceftriaxone  and doxycycline  for now, avoiding Augmentin due to transaminitis.  Check urine antigens strep Legionella sputum culture if patient is able to produce a sample and MRSA nasal swab.  Maintain aspiration precautions and speech eval pending.  Discussed with daughter patient will need follow-up chest x-ray at a minimum of 4 weeks to monitor for improvement otherwise may need CT chest to evaluate the density of this as there may be underlying issue.  Would hold off on CT  chest with contrast for now given contrast load with the CAT scans already performed today.  - Continue B12 vitamin D  Fish oil multivitamin supplements  - Continue PPI for GERD  - Continue Neurontin  daily family reports that patient is emotionally labile with some occasional tearful moments typically in the mornings otherwise she is very pleasant and talkative at baseline although oriented only x 1 at best due to underlying advanced dementia  - MRI brain has been ordered to evaluate for ischemic stroke  - 2D echo has been ordered to evaluate for shunting/embolic source  - Monitor LFTs closely patient has no right upper quadrant tenderness, isolated AST and ALT elevation, need to watch this also in the setting of statin therapy increased dose.  If persistent transaminitis will need to consider right upper quadrant ultrasound.  Otherwise no infectious symptoms at this time  - patient receiving medication therapy which will require intensive monitoring  - Glucomander for blood sugar monitoring    Diet: Cardiac diet reticulocyte parameters, easy to chew   DVT ppx: Lovenox       DISPO  -Pt to be admitted  at this time for reasons addressed above, continued hospitalization for ongoing assessment and treatment indicated     Anticipated Date of Discharge: 2-3 midnights  Anticipated Disposition (home, SNF) : Home    Code Status: Full    MPOA: Daughter at bedside Batya Citron [phone]: works for environmental services at this facility.    Full  code as discussed with patient and patient's power of attorney  . Phone number for the power of attorney and the medical decision maker is listed above. Discussed nature of interventions like cardiopulmonary resuscitation/ventilatory support and patient/power of attorney clearly voices that she would want all life prolonging measures or aggressive interventions as needed to be alive. Time spent exclusively in advance care planning is 17 minutes      Chief Complaint:   Chief Complaint    Patient presents with    Transient Ischemic Attack         HPI:   Sharon Roman is a 89 y.o. year old female past medical history hypertension hyperlipidemia diabetes chronic kidney disease GERD dementia who presents today companied by her caregiver Rosina and her daughter [name] for concern of left-sided weakness.  Caregiver states they were sitting outside patient was sitting upright when she suddenly became unresponsive, she thinks she was down for generally 10 minutes, per paramedics patient was responsive only to pain but did not seem to be moving the left side including left arm and left leg at all.  Patient was evaluated in the emergency department as a stroke alert.  Family reports at baseline patient is very pleasant but demented, she is occasionally tearful in the morning but otherwise very talkative.  They reports she is now at her neurologic baseline.  They have not noticed any symptoms recently other than perhaps patient's been a little bit weaker in both of her legs over the last 2 weeks or so.      Review of Systems -   Review of Systems   Unable to perform ROS: Dementia           Past Medical History:  No past medical history on file.    Past Surgical History:  No past surgical history on file.    Family History:  No family history on file.    Social History:  Social History     Socioeconomic History    Marital status: Single     Social Drivers of Psychologist, prison and probation services Strain: Low Risk  (01/03/2023)    Received from [facility]    Overall Financial Resource Strain (CARDIA)     Difficulty of Paying Living Expenses: Not hard at all   Food Insecurity: No Food Insecurity (01/03/2023)    Received from [facility]    Hunger Vital Sign     Within the past 12 months, you worried that your food would run out before you got the money to buy more.: Never true     Within the past 12 months, the food you bought just didn't last and you didn't have money to get more.: Never true   Transportation Needs: No  Transportation Needs (01/03/2023)    Received from [facility] - Transportation     Lack of Transportation (Medical): No     Lack of Transportation (Non-Medical): No   Physical Activity: Insufficiently Active (01/03/2023)    Received from [facility]    Exercise Vital Sign     On average, how many days per week do you engage in moderate to strenuous exercise (like a brisk walk)?: 2 days     On average, how many minutes do you engage in exercise at this level?: 20 min   Stress: No Stress Concern Present (01/03/2023)    Received from [facility] - Occupational Stress Questionnaire  Feeling of Stress : Not at all   Social Connections: Unknown (01/03/2023)    Received from [facility]    Social Connection and Isolation Panel     In a typical week, how many times do you talk on the phone with family, friends, or neighbors?: More than three times a week     How often do you get together with friends or relatives?: More than three times a week     How often do you attend church or religious services?: Patient declined     Do you belong to any clubs or organizations such as church groups, unions, fraternal or athletic groups, or school groups?: No     How often do you attend meetings of the clubs or organizations you belong to?: Never     Are you married, widowed, divorced, separated, never married, or living with a partner?: Patient declined   Intimate Partner Violence: Not At Risk (01/03/2023)    Received from [facility]    Humiliation, Afraid, Rape, and Kick questionnaire     Within the last year, have you been afraid of your partner or ex-partner?: No     Within the last year, have you been humiliated or emotionally abused in other ways by your partner or ex-partner?: No     Within the last year, have you been kicked, hit, slapped, or otherwise physically hurt by your partner or ex-partner?: No     Within the last year, have you been raped or forced to have any kind  of sexual activity by your partner or ex-partner?: No       Home Medications:  Prior to Admission medications   Medication Sig Start Date End Date Taking? Authorizing Provider   amLODIPine  (NORVASC ) 5 MG tablet Take 1 tablet by mouth daily   Yes [provider]   losartan  (COZAAR ) 100 MG tablet Take 1 tablet by mouth daily   Yes [provider]   oxyBUTYnin  (DITROPAN -XL) 10 MG extended release tablet Take 1 tablet by mouth daily   Yes [provider]   simvastatin (ZOCOR) 40 MG tablet Take 1 tablet by mouth nightly   Yes [provider]   hydrocortisone 2.5 % cream Apply topically 2 times daily Apply sparingly to affected area 2 to 4 times daily.   Yes [provider]   Omega-3 Fatty Acids (FISH OIL) 1000 MG capsule Take 1 capsule by mouth daily   Yes [provider]   Multiple Vitamins-Minerals (CENTRUM SILVER WOMEN 50+) TABS Take 1 tablet by mouth daily   Yes [provider]   gabapentin  (NEURONTIN ) 400 MG capsule Take 1 capsule by mouth every morning. Max Daily Amount: 400 mg   Yes [provider]   omeprazole (PRILOSEC) 20 MG delayed release capsule Take 1 capsule by mouth every morning   Yes [provider]   linagliptin (TRADJENTA) 5 MG tablet Take 1 tablet by mouth every morning   Yes [provider]   vitamin B-12 (CYANOCOBALAMIN ) 500 MCG tablet Take 1 tablet by mouth daily   Yes [provider]   vitamin D  (CHOLECALCIFEROL ) 25 MCG (1000 UT) TABS tablet Take 1 tablet by mouth every morning   Yes [provider]   loratadine (CLARITIN) 10 MG tablet Take 1 tablet by mouth daily   Yes [provider]   docusate sodium (COLACE) 100 MG capsule Take 1 capsule by mouth daily   Yes [provider]   donepezil (ARICEPT) 5  MG tablet Take 1 tablet by mouth nightly    [provider]   hydrOXYzine HCl (ATARAX) 25 MG tablet Take 1 tablet by mouth in the morning and at bedtime    [provider]       Allergies:  Allergies   Allergen Reactions    Sulfa Antibiotics Rash         Physical Exam:   Visit Vitals  BP 120/72   Pulse 84   Resp 19   Ht 1.6 m (5' 3)   SpO2 95%   BMI 28.34 kg/m     Physical Exam  Constitutional:       General: She is awake.      Appearance: She is ill-appearing.      Comments: Pleasantly confused   HENT:      Head: Normocephalic and atraumatic.      Mouth/Throat:      Mouth: Mucous membranes are dry.      Dentition: Abnormal dentition.   Cardiovascular:      Rate and Rhythm: Normal rate and regular rhythm.      Heart sounds: Normal heart sounds, S1 normal and S2 normal.   Pulmonary:      Effort: Pulmonary effort is normal.      Breath sounds: Normal breath sounds.   Abdominal:      General: Abdomen is flat. Bowel sounds are normal.      Palpations: Abdomen is soft.      Tenderness: There is no abdominal tenderness.   Musculoskeletal:      Cervical back: Neck supple.   Skin:     General: Skin is warm and dry.      Capillary Refill: Capillary refill takes less than 2 seconds.   Neurological:      General: No focal deficit present.      Comments: Moving all extremities spontaneously  Oriented x 1 baseline   Psychiatric:         Behavior: Behavior is cooperative.          Intake and Output:  Current Shift:  No intake/output data recorded.  Last three shifts:  No intake/output data recorded.    Lab/Data Reviewed:  Recent Results (from the past 24 hours)   CBC with Auto Differential    Collection Time: 11/28/23  3:24 PM   Result Value Ref Range    WBC 5.6 4.0 - 11.0 1000/mm3    RBC 4.01 3.60 - 5.20 M/uL    Hemoglobin 11.4 11.0 - 16.0 gm/dl    Hematocrit 63.7 64.9 - 47.0 %    MCV 90.3 80.0 - 98.0 fL    MCH 28.4 25.4 - 34.6 pg    MCHC 31.5 30.0 - 36.0 gm/dl    Platelets 852 859 - 450 1000/mm3    MPV 10.5 (H) 6.0 - 10.0 fL    RDW 45.8 36.4 - 46.3      Nucleated RBCs 0 0 - 0      Immature Granulocytes % 0.5 0.0 - 3.0 %    Neutrophils Segmented 61.0 34 - 64 %    Lymphocytes 22.8  (L) 28 - 48 %    Monocytes 8.2 1 - 13 %    Eosinophils 6.8 (H) 0 - 5 %    Basophils 0.7 0 - 3 %   Comprehensive Metabolic Panel    Collection Time: 11/28/23  3:24 PM   Result Value Ref Range    Potassium 3.5 3.5 - 5.1 mEq/L  Chloride 104 98 - 107 mEq/L    Sodium 142 136 - 145 mEq/L    CO2 28 20 - 31 mEq/L    Glucose 183 (H) 74 - 106 mg/dl    BUN 17 9 - 23 mg/dl    Creatinine 8.71 (H) 0.55 - 1.02 mg/dl    GFR African American 51.0      GFR Non-African American 42      Calcium  9.5 8.7 - 10.4 mg/dl    Anion Gap 10 5 - 15 mmol/L    AST 166.0 (H) 0.0 - 33.9 U/L    ALT 326 (H) 10 - 49 U/L    Alkaline Phosphatase 110 46 - 116 U/L    Total Bilirubin 0.50 0.30 - 1.20 mg/dl    Total Protein 7.2 5.7 - 8.2 gm/dl    Albumin 3.3 (L) 3.4 - 5.0 gm/dl   Troponin    Collection Time: 11/28/23  3:24 PM   Result Value Ref Range    Troponin, High Sensitivity 66 (H) 0 - 34 ng/L   Protime-INR    Collection Time: 11/28/23  3:24 PM   Result Value Ref Range    Protime 12.3 10.2 - 12.9 seconds    INR 1.1 0.1 - 1.1     APTT    Collection Time: 11/28/23  3:24 PM   Result Value Ref Range    aPTT 25.2 25.1 - 36.5 seconds       XR CHEST (2 VW)  Result Date: 11/28/2023  History:  Brief period of unresponsiveness with inability to move the left side. Report:  PA and lateral chest radiographs are submitted for interpretation; 2 exposures and 2 radiographic views. Comparison: 11/28/2023. Findings: Ill-defined dense peribronchiolar opacities noted in the left lower lobe possibly indicating pneumonitis. Please correlate with auscultatory findings. The lungs are otherwise clear without evidence of segmental or subsegmental alveolar infiltrate. The heart, pleural surfaces, and thoracic aorta appear unremarkable radiographically.     IMPRESSION: Dense peribronchiolar opacity left lower lobe concerning for bronchogenic pneumonia. Please correlate with auscultatory findings. Electronically signed by: Alm Conte, MD 11/28/2023 5:12 PM EDT           Workstation ID: [id number]     XR CHEST PORTABLE  Result Date: 11/28/2023  History: Brief period of unresponsiveness with an inability to move the left side. Symptoms since resolved. Technique: A single AP portable upright portable chest radiograph is submitted for interpretation, one radiographic view. Comparison: Chest radiography, 04/21/2023. Findings: There is an elevated left hemidiaphragm with hyperattenuating ill-defined focus possibly indicating stool at the underlying splenic flexure, less likely mass at the left lung base. This can be further characterized with lateral radiography in order to exclude a left lower lobe pathology. Otherwise, the lungs are generally clear. The heart is borderline enlarged with left atrial chamber prominence. There is a tortuous, atherosclerotic thoracic aorta.     IMPRESSION: Elevated left hemidiaphragm. Ill-defined density possibly representing stool in the underlying splenic flexure versus left lower lobe opacity. Lateral radiography would be useful in order to differentiate between these possibilities. Borderline cardiomegaly with left atrial chamber prominence. Tortuous, atherosclerotic thoracic aorta. Electronically signed by: Alm Conte, MD 11/28/2023 4:23 PM EDT          Workstation ID: [id number]     CTA HEAD W WO CONTRAST  Result Date: 11/28/2023  HISTORY: Stroke. TECHNIQUE: Helical acquisition of CT angiogram images was performed through the head and neck after intravenous administration of contrast with MIP reformats. 3-D postprocessing was  performed at an outside 3-D lab. COMPARISON: None. FINDINGS: HEAD CTA: There is bilateral cavernous and supraclinoid ICA atherosclerotic plaque without stenosis. There is mild stenosis of the midportion of the right high thigh on image 58, series 10. There is moderate stenosis of the midportion of the left thigh on image 59, series 10. The bilateral anterior and posterior circulation are otherwise patent without stenosis or  occlusion. There is no visualized aneurysm or vascular malformation. The dural venous sinuses are patent. Nonvascular findings: No visualized acute infarct, hemorrhage, mass, mass effect or hydrocephalus. Orbits appear unremarkable demonstrate postsurgical changes in the globes status post lens replacement. Paranasal sinuses demonstrated bubbly secretions in the right maxillary sinus and mastoid air cells are clear. Osseous structures are intact. There are degenerative changes of the cervical spine. Soft tissues are unremarkable. NECK CTA: Right carotid circulation: There is calcified atherosclerotic plaque involving the carotid bifurcation without stenosis The right CCA and ICA are patent without stenosis. Left carotid circulation: There is scattered atherosclerotic plaque along the distal CCA without stenosis The left CCA and ICA are patent without stenosis. Vertebral arteries: Patent without stenosis. Subclavian arteries: Patent without stenosis. Brachiocephalic trunk and aortic arch: Atherosclerosis without stenosis. There is no visualized aneurysm or dissection. Nonvascular findings: There is no visualized cervical mass or adenopathy by size criteria. The nasopharynx is unremarkable. Pharyngeal and laryngeal structures appear unremarkable. There is suboptimal visualization of the oral cavity due to streak artifact and beam hardening from dental hardware, however the floor the mouth is unremarkable. Glandular structures are unremarkable. Visualized portions of the mediastinum are unremarkable. Visualized portions of the lungs are clear. The retropharyngeal space and superficial soft tissues are unremarkable.     IMPRESSION: Head CTA: 1. No evidence of a large vessel occlusion. 2. Bilateral high-grade stenoses, moderate on left and mild on the right, as detailed above. Neck CTA: No significant stenosis or occlusion identified. Electronically signed by: Lillia Hug, MD 11/28/2023 4:09 PM EDT           Workstation ID: [id number]     CTA NECK W WO CONTRAST  Result Date: 11/28/2023  HISTORY: Stroke. TECHNIQUE: Helical acquisition of CT angiogram images was performed through the head and neck after intravenous administration of contrast with MIP reformats. 3-D postprocessing was performed at an outside 3-D lab. COMPARISON: None. FINDINGS: HEAD CTA: There is bilateral cavernous and supraclinoid ICA atherosclerotic plaque without stenosis. There is mild stenosis of the midportion of the right high thigh on image 58, series 10. There is moderate stenosis of the midportion of the left thigh on image 59, series 10. The bilateral anterior and posterior circulation are otherwise patent without stenosis or occlusion. There is no visualized aneurysm or vascular malformation. The dural venous sinuses are patent. Nonvascular findings: No visualized acute infarct, hemorrhage, mass, mass effect or hydrocephalus. Orbits appear unremarkable demonstrate postsurgical changes in the globes status post lens replacement. Paranasal sinuses demonstrated bubbly secretions in the right maxillary sinus and mastoid air cells are clear. Osseous structures are intact. There are degenerative changes of the cervical spine. Soft tissues are unremarkable. NECK CTA: Right carotid circulation: There is calcified atherosclerotic plaque involving the carotid bifurcation without stenosis The right CCA and ICA are patent without stenosis. Left carotid circulation: There is scattered atherosclerotic plaque along the distal CCA without stenosis The left CCA and ICA are patent without stenosis. Vertebral arteries: Patent without stenosis. Subclavian arteries: Patent without stenosis. Brachiocephalic trunk and aortic arch: Atherosclerosis without stenosis. There is no visualized aneurysm  or dissection. Nonvascular findings: There is no visualized cervical mass or adenopathy by size criteria. The nasopharynx is unremarkable. Pharyngeal and laryngeal  structures appear unremarkable. There is suboptimal visualization of the oral cavity due to streak artifact and beam hardening from dental hardware, however the floor the mouth is unremarkable. Glandular structures are unremarkable. Visualized portions of the mediastinum are unremarkable. Visualized portions of the lungs are clear. The retropharyngeal space and superficial soft tissues are unremarkable.     IMPRESSION: Head CTA: 1. No evidence of a large vessel occlusion. 2. Bilateral high-grade stenoses, moderate on left and mild on the right, as detailed above. Neck CTA: No significant stenosis or occlusion identified. Electronically signed by: Lillia Hug, MD 11/28/2023 4:09 PM EDT          Workstation ID: [id number]     CT HEAD WO CONTRAST  Result Date: 11/28/2023  History: Stroke alert. Technique: Computed tomographic imaging of the head was performed without intravenous contrast medium with scanning at longitudinal slice collimation of 5 mm from the skull base to the vertex. Additionally, 4-mm transaxial and 3-mm coronal and sagittal slice reconstructions were generated. FINDINGS: Ventricular system: The third and fourth ventricles are midline and not displaced. There is no effacement of the lateral ventricles or basal cisterns. Brain parenchyma: No acute intracranial hemorrhage or focal mass is appreciated. There is, however, diffuse hypoattenuation of the periventricular white matter right greater than left compatible with chronic microvascular disease. There is no CT evidence of acute intracranial ischemic change at this time. Please note that noncontrast CT is insensitive for changes of intracranial ischemia during the first 24-48 hours following the acute event. ASPECTS Scoring Sheet. Right M1 = 1 M2 = 1 M3 = 1 M4 = 1 M5 = 1 M6 = 1 Caudate = 1 Internal Capsule = 1 Lentiform nucleus = 1 Insular Ribbon = 1 TOTAL Right ASPECTS = 10 Left M1 = 1 M2 = 1 M3 = 1 M4 = 1 M5 = 1 M6 = 1 Caudate = 1 Internal  Capsule = 1 Lentiform nucleus = 1 Insular Ribbon = 1 TOTAL Left ASPECTS = 10 Extra-axial Spaces: No extra-axial fluid collections are noted. Calvaria / Scalp: CT evaluation of the calvaria demonstrates no focal bony normality. There is evidence of hyperostosis frontalis interna. The visualized portions of the paranasal sinuses and mastoid air cells are bilaterally well-aerated and clear. Orbits: There is bilateral aphakia.     IMPRESSION: No CT evidence of acute intracranial pathology. Diffuse hypoattenuation of the periventricular white matter compatible with chronic microvascular disease. No CT evidence of intracranial mass, acute ischemia, or acute hemorrhage. As you know, CT may be relatively insensitive for detection of ischemic change during the hyperacute/acute phase. Follow-up imaging depends upon the clinical scenario, and may entail repeat CT, MR with diffusion-restriction weighted sequences, or possibly CTA/MRA. All CT scans at this facility are performed using dose optimization technique as appropriate, to include automated exposure control, adjustment of mA and/or kVp according to patient size, or use of iterative reconstruction techniques. Electronically signed by: Alm Conte, MD 11/28/2023 4:03 PM EDT          Workstation ID: [id number]             - I independently reviewed the imaging studies done on the patient and agree with interpretation done by the radiologist and reviewed available blood work from this presentation  -I discussed the patient with the emergency room physician about the necessity to admit the patient to the  hospital  -RECORDS REVIEWED: I reviewed patient's history previous chart and laboratory results during this admission while making the diagnosis and plan for admission to the hospital- this included but not limited to available notes in [facility], [facility] system, outpatient PCP records             Arlean Sheffield, DO    November 28, 2023  5:32 PM    Dragon  medical dictation software was used for portions of this report.  Unintended voice transcription errors may have occurred.

## 2023-11-28 NOTE — Progress Notes (Signed)
 [facility] Dosing Services: Renal Dosing Adjustment    Lovenox  was automatically dose-adjusted per [facility] P&T Committee Protocol, with respect to renal function.      Previous Regimen Lovenox  40mg  SC daily   Serum Creatinine Lab Results   Component Value Date/Time    CREATININE 1.28 11/28/2023 03:24 PM      Creatinine Clearance Estimated Creatinine Clearance: 28 mL/min (A) (based on SCr of 1.28 mg/dL (H)).   BUN Lab Results   Component Value Date/Time    BUN 17 11/28/2023 03:24 PM        Dose changed to Lovenox  30mg  SC daily for CrCl < 30 mL/min    Pharmacy to continue to monitor patient daily. Will make dosage adjustments based upon changing renal function.

## 2023-11-28 NOTE — ED Notes (Signed)
 Placed call, Nurse in [address] requested five minutes before taking report.      [place] Harlene SQUIBB, RN  11/28/23 [phone]

## 2023-11-28 NOTE — ED Provider Notes (Signed)
 [facility]  Emergency Department Treatment Report      Patient: Sharon Roman Age: 89 y.o. Sex: female    Date of Birth: [birth year] Admit Date: 11/28/2023 PCP: No primary care provider on file.   MRN: [id number]  CSN: [id number]     Room: 6626/6626 Time Dictated: 8:55 PM      Chief Complaint   Chief Complaint   Patient presents with    Transient Ischemic Attack       History of Present Illness   89 y.o. female with a past medical history of moderate dementia, type 2 diabetes, CKD, presenting to the emergency department for evaluation of an unresponsive episode and left-sided weakness.  Around 2:45 PM, she became unresponsive while seated, upon arrival of EMS, she started slowly coming around and was then noted to have significant left arm and left leg weakness that lasted a few minutes before it resolved back to baseline.  She recently finished a course of antibiotics for a UTI but otherwise has not had any additional complaints per her daughter bedside and per patient and states I am feeling well.  She is not on any blood thinners.    Review of Systems   ROS   As outlined in HPI    Past Medical/Surgical History   No past medical history on file.  No past surgical history on file.    Social History     Social History     Socioeconomic History    Marital status: Single     Spouse name: Not on file    Number of children: Not on file    Years of education: Not on file    Highest education level: Not on file   Occupational History    Not on file   Tobacco Use    Smoking status: Not on file    Smokeless tobacco: Not on file   Substance and Sexual Activity    Alcohol use: Never    Drug use: Not on file    Sexual activity: Not on file   Other Topics Concern    Not on file   Social History Narrative    Not on file     Social Drivers of Health     Financial Resource Strain: Low Risk  (01/03/2023)    Received from [facility]    Overall Financial Resource Strain (CARDIA)     Difficulty of Paying Living Expenses:  Not hard at all   Food Insecurity: No Food Insecurity (01/03/2023)    Received from [facility]    Hunger Vital Sign     Within the past 12 months, you worried that your food would run out before you got the money to buy more.: Never true     Within the past 12 months, the food you bought just didn't last and you didn't have money to get more.: Never true   Transportation Needs: No Transportation Needs (01/03/2023)    Received from [facility] - Transportation     Lack of Transportation (Medical): No     Lack of Transportation (Non-Medical): No   Physical Activity: Insufficiently Active (01/03/2023)    Received from [facility]    Exercise Vital Sign     On average, how many days per week do you engage in moderate to strenuous exercise (like a brisk walk)?: 2 days     On average, how many minutes do you engage in exercise at this level?: 20  min   Stress: No Stress Concern Present (01/03/2023)    Received from [facility] - Occupational Stress Questionnaire     Feeling of Stress : Not at all   Social Connections: Unknown (01/03/2023)    Received from [facility]    Social Connection and Isolation Panel     In a typical week, how many times do you talk on the phone with family, friends, or neighbors?: More than three times a week     How often do you get together with friends or relatives?: More than three times a week     How often do you attend church or religious services?: Patient declined     Do you belong to any clubs or organizations such as church groups, unions, fraternal or athletic groups, or school groups?: No     How often do you attend meetings of the clubs or organizations you belong to?: Never     Are you married, widowed, divorced, separated, never married, or living with a partner?: Patient declined   Intimate Partner Violence: Not At Risk (01/03/2023)    Received from [facility]    Humiliation, Afraid, Rape, and Kick questionnaire     Within the last  year, have you been afraid of your partner or ex-partner?: No     Within the last year, have you been humiliated or emotionally abused in other ways by your partner or ex-partner?: No     Within the last year, have you been kicked, hit, slapped, or otherwise physically hurt by your partner or ex-partner?: No     Within the last year, have you been raped or forced to have any kind of sexual activity by your partner or ex-partner?: No   Housing Stability: Not on file       Family History   No family history on file.    Current Medications     Current Facility-Administered Medications   Medication Dose Route Frequency Provider Last Rate Last Admin    [Held by provider] amLODIPine  (NORVASC ) tablet 5 mg  5 mg Oral Daily Hutchinson, Anne, DO        [START ON 11/29/2023] gabapentin  (NEURONTIN ) capsule 400 mg  400 mg Oral QAM Hutchinson, Anne, DO        [Held by provider] losartan  (COZAAR ) tablet 100 mg  100 mg Oral Daily Dwane Dragon, DO        [START ON 11/29/2023] therapeutic multivitamin-minerals 1 tablet  1 tablet Oral Daily Dwane Dragon, DO        [START ON 11/29/2023] omega-3 acid ethyl esters (LOVAZA ) capsule 1 capsule  1 capsule Oral Daily Hutchinson, Anne, DO        [START ON 11/29/2023] pantoprazole  (PROTONIX ) tablet 40 mg  40 mg Oral QAM AC Hutchinson, Anne, DO        [START ON 11/29/2023] oxyBUTYnin  (DITROPAN -XL) extended release tablet 10 mg  10 mg Oral Daily Hutchinson, Anne, DO        [START ON 11/29/2023] vitamin B-12 (CYANOCOBALAMIN ) tablet 500 mcg  500 mcg Oral Daily Hutchinson, Anne, DO        [START ON 11/29/2023] Vitamin D  (CHOLECALCIFEROL ) tablet 1,000 Units  1,000 Units Oral QAM Hutchinson, Anne, DO        sodium chloride  flush 0.9 % injection 5-40 mL  5-40 mL IntraVENous 2 times per day Hutchinson, Anne, DO        sodium chloride  flush 0.9 % injection 5-40  mL  5-40 mL IntraVENous PRN Hutchinson, Anne, DO        0.9 % sodium chloride  infusion   IntraVENous PRN Hutchinson, Anne, DO        ondansetron   (ZOFRAN -ODT) disintegrating tablet 4 mg  4 mg Oral Q8H PRN Hutchinson, Anne, DO        Or    ondansetron  (ZOFRAN ) injection 4 mg  4 mg IntraVENous Q6H PRN Hutchinson, Anne, DO        polyethylene glycol (GLYCOLAX ) packet 17 g  17 g Oral Daily PRN Dwane Dragon, DO        [START ON 11/29/2023] enoxaparin  Sodium (LOVENOX ) injection 30 mg  30 mg SubCUTAneous Daily Hutchinson, Anne, DO        acetaminophen  (TYLENOL ) tablet 650 mg  650 mg Oral Q4H PRN Hutchinson, Anne, DO        Or    acetaminophen  (TYLENOL ) suppository 650 mg  650 mg Rectal Q4H PRN Hutchinson, Anne, DO        atorvastatin  (LIPITOR ) tablet 80 mg  80 mg Oral Nightly Hutchinson, Anne, DO        [START ON 11/29/2023] clopidogrel  (PLAVIX ) tablet 75 mg  75 mg Oral Daily Hutchinson, Anne, DO        [START ON 11/29/2023] aspirin  EC tablet 81 mg  81 mg Oral Daily Hutchinson, Anne, DO        sodium chloride  0.9 % bolus 500 mL  500 mL IntraVENous Once Hutchinson, Anne, DO        cefTRIAXone  (ROCEPHIN ) 1,000 mg in sodium chloride  0.9 % 50 mL IVPB (mini-bag)  1,000 mg IntraVENous Q24H Hutchinson, Anne, DO        doxycycline  (VIBRAMYCIN ) 100 mg in sodium chloride  0.9 % 100 mL IVPB (mini-bag)  100 mg IntraVENous Q12H Hutchinson, Anne, DO           Allergies     Allergies   Allergen Reactions    Sulfa Antibiotics Rash       Physical Exam     ED Triage Vitals [11/28/23 1530]   BP Girls Systolic BP Percentile Girls Diastolic BP Percentile Boys Systolic BP Percentile Boys Diastolic BP Percentile Temp Temp src Pulse   120/72 -- -- -- -- -- -- 84      Respirations SpO2 Height Weight       19 95 % 1.6 m (5' 3) --          Physical Exam  Vitals and nursing note reviewed.   Constitutional:       General: She is not in acute distress.     Appearance: Normal appearance. She is not ill-appearing.   HENT:      Head: Normocephalic and atraumatic.   Cardiovascular:      Rate and Rhythm: Normal rate and regular rhythm.      Pulses: Normal pulses.      Heart sounds: Normal heart  sounds.   Pulmonary:      Effort: Pulmonary effort is normal. No respiratory distress.      Breath sounds: Normal breath sounds. No wheezing or rales.   Abdominal:      General: Abdomen is flat. There is no distension.      Palpations: Abdomen is soft.      Tenderness: There is no abdominal tenderness. There is no guarding.   Musculoskeletal:         General: Normal range of motion.      Cervical back: Normal range of motion.  Skin:     General: Skin is warm.      Capillary Refill: Capillary refill takes less than 2 seconds.   Neurological:      Mental Status: She is alert.      Comments: RESULT SUMMARY:  3 points  NIH Stroke Scale    1A: Level of consciousness -> 0 = Alert; keenly responsive  1B: Ask month and age -> 2 = 0 questions right   1C: 'Blink eyes' & 'squeeze hands' -> 0 = Performs both tasks  2: Horizontal extraocular movements -> 0 = Normal  3: Visual fields -> 0 = No visual loss  4: Facial palsy -> 0 = Normal symmetry  5A: Left arm motor drift -> 0 = No drift for 10 seconds  5B: Right arm motor drift -> 0 = No drift for 10 seconds  6A: Left leg motor drift -> 0 = No drift for 5 seconds  6B: Right leg motor drift -> 0 = No drift for 5 seconds  7: Limb Ataxia -> 0 = No ataxia  8: Sensation -> 0 = Normal; no sensory loss  9: Language/aphasia -> 1 = Mild-moderate aphasia: some obvious changes, without significant limitation  10: Dysarthria -> 0 = Normal  11: Extinction/inattention -> 0 = No abnormality    Difficulty with month and age are baseline as patient has baseline orientation x 1, daughter bedside states that patient would normally not have any difficulty with naming objects which patient was unable to identify a watch or glasses or my phone             Impression and Management Plan   89 year old female presenting to the emergency department, after an unresponsive episode, patient had left-sided weakness that then resolved.    Upon arrival to the ER patient was noted to have an NIH stroke scale of  3, 2 points were baseline due to her dementia, patient had new difficulty with naming objects but otherwise had no motor deficits on my examination.    Considered encephalopathy, infection, pneumonia, UTI, dehydration, AKI, TIA, CVA, etc.    Will obtain urinalysis, chest x-ray, basic blood work, troponin, and stroke alert given her constellation of symptoms.  CT head, CTA head/neck without acute new findings.  Perfusion pending but did not appear to have anything acute per neurology.  Chest x-ray 1 view showed a possible pneumonia versus stool burden so a 2 view was ordered and pending at time of admission.  EKG was nonischemic.  Basic labs including CBC were unremarkable.  CMP with a creatinine of 1.28, fairly similar to baseline.  New transaminitis noted, no abdominal pain noted on examination.  Troponin mildly elevated to 66 but patient denying any chest pain or shortness of breath.    Discussed with neurology, who recommended a loading dose of aspirin , Plavix  which have been ordered and a stroke workup inpatient including an MRI and echo.    Discussed with patient and family bedside, they were updated about result findings and plan for admission.  Patient remain full code at this time.  Discussed with Dr. Colon who is agreeable for admission for further workup and management.  We discussed patient's ongoing symptoms and initial 1 view x-ray concerning for possible pneumonia versus stool burden and so 2 view x-ray was ordered and pending at time of admission.    Diagnostic Studies   Lab:   Results for orders placed or performed during the hospital encounter of 11/28/23   CT HEAD  WO CONTRAST    Narrative    History: Stroke alert.    Technique: Computed tomographic imaging of the head was performed without  intravenous contrast medium with scanning at longitudinal slice collimation of 5  mm from the skull base to the vertex. Additionally, 4-mm transaxial and 3-mm  coronal and sagittal slice reconstructions were  generated.    FINDINGS:    Ventricular system:    The third and fourth ventricles are midline and not displaced. There is no  effacement of the lateral ventricles or basal cisterns.    Brain parenchyma:    No acute intracranial hemorrhage or focal mass is appreciated. There is,  however, diffuse hypoattenuation of the periventricular white matter right  greater than left compatible with chronic microvascular disease.    There is no CT evidence of acute intracranial ischemic change at this time.  Please note that noncontrast CT is insensitive for changes of intracranial  ischemia during the first 24-48 hours following the acute event.    ASPECTS Scoring Sheet.    Right     M1 = 1  M2 = 1  M3 = 1    M4 = 1  M5 = 1  M6 = 1    Caudate = 1  Internal Capsule = 1  Lentiform nucleus = 1  Insular Ribbon = 1    TOTAL Right ASPECTS = 10    Left    M1 = 1  M2 = 1  M3 = 1    M4 = 1  M5 = 1  M6 = 1    Caudate = 1  Internal Capsule = 1  Lentiform nucleus = 1  Insular Ribbon = 1    TOTAL Left ASPECTS = 10    Extra-axial Spaces:    No extra-axial fluid collections are noted.    Calvaria / Scalp:    CT evaluation of the calvaria demonstrates no focal bony normality. There is  evidence of hyperostosis frontalis interna. The visualized portions of the  paranasal sinuses and mastoid air cells are bilaterally well-aerated and clear.    Orbits:    There is bilateral aphakia.      Impression    IMPRESSION:    No CT evidence of acute intracranial pathology. Diffuse hypoattenuation of the  periventricular white matter compatible with chronic microvascular disease.    No CT evidence of intracranial mass, acute ischemia, or acute hemorrhage. As you  know, CT may be relatively insensitive for detection of ischemic change during  the hyperacute/acute phase.    Follow-up imaging depends upon the clinical scenario, and may entail repeat CT,  MR with diffusion-restriction weighted sequences, or possibly CTA/MRA.    All CT scans at this facility are  performed using dose optimization technique as  appropriate, to include automated exposure control, adjustment of mA and/or kVp  according to patient size, or use of iterative reconstruction techniques.    Electronically signed by: Alm Conte, MD 11/28/2023 4:03 PM EDT            Workstation ID: [id number]     CTA HEAD W WO CONTRAST    Narrative    HISTORY: Stroke.    TECHNIQUE: Helical acquisition of CT angiogram images was performed through the  head and neck after intravenous administration of contrast with MIP reformats.  3-D postprocessing was performed at an outside 3-D lab.    COMPARISON: None.    FINDINGS:    HEAD CTA:  There is bilateral cavernous and  supraclinoid ICA atherosclerotic plaque without  stenosis.    There is mild stenosis of the midportion of the right high thigh on image 58,  series 10. There is moderate stenosis of the midportion of the left thigh on  image 59, series 10.    The bilateral anterior and posterior circulation are otherwise patent without  stenosis or occlusion.    There is no visualized aneurysm or vascular malformation.    The dural venous sinuses are patent.      Nonvascular findings:     No visualized acute infarct, hemorrhage, mass, mass effect or hydrocephalus.  Orbits appear unremarkable demonstrate postsurgical changes in the globes status  post lens replacement. Paranasal sinuses demonstrated bubbly secretions in the  right maxillary sinus and mastoid air cells are clear. Osseous structures are  intact. There are degenerative changes of the cervical spine. Soft tissues are  unremarkable.      NECK CTA:    Right carotid circulation: There is calcified atherosclerotic plaque involving  the carotid bifurcation without stenosis The right CCA and ICA are patent  without stenosis.    Left carotid circulation: There is scattered atherosclerotic plaque along the  distal CCA without stenosis The left CCA and ICA are patent without stenosis.    Vertebral arteries: Patent  without stenosis.    Subclavian arteries: Patent without stenosis.    Brachiocephalic trunk and aortic arch: Atherosclerosis without stenosis.    There is no visualized aneurysm or dissection.      Nonvascular findings:      There is no visualized cervical mass or adenopathy by size criteria.     The nasopharynx is unremarkable.     Pharyngeal and laryngeal structures appear unremarkable. There is suboptimal  visualization of the oral cavity due to streak artifact and beam hardening from  dental hardware, however the floor the mouth is unremarkable.     Glandular structures are unremarkable.     Visualized portions of the mediastinum are unremarkable.     Visualized portions of the lungs are clear.     The retropharyngeal space and superficial soft tissues are unremarkable.      Impression    IMPRESSION:    Head CTA:  1. No evidence of a large vessel occlusion.    2. Bilateral high-grade stenoses, moderate on left and mild on the right, as  detailed above.    Neck CTA:    No significant stenosis or occlusion identified.    Electronically signed by: Lillia Hug, MD 11/28/2023 4:09 PM EDT            Workstation ID: [id number]     CTA NECK W WO CONTRAST    Narrative    HISTORY: Stroke.    TECHNIQUE: Helical acquisition of CT angiogram images was performed through the  head and neck after intravenous administration of contrast with MIP reformats.  3-D postprocessing was performed at an outside 3-D lab.    COMPARISON: None.    FINDINGS:    HEAD CTA:  There is bilateral cavernous and supraclinoid ICA atherosclerotic plaque without  stenosis.    There is mild stenosis of the midportion of the right high thigh on image 58,  series 10. There is moderate stenosis of the midportion of the left thigh on  image 59, series 10.    The bilateral anterior and posterior circulation are otherwise patent without  stenosis or occlusion.    There is no visualized aneurysm or vascular malformation.    The dural  venous sinuses are  patent.      Nonvascular findings:     No visualized acute infarct, hemorrhage, mass, mass effect or hydrocephalus.  Orbits appear unremarkable demonstrate postsurgical changes in the globes status  post lens replacement. Paranasal sinuses demonstrated bubbly secretions in the  right maxillary sinus and mastoid air cells are clear. Osseous structures are  intact. There are degenerative changes of the cervical spine. Soft tissues are  unremarkable.      NECK CTA:    Right carotid circulation: There is calcified atherosclerotic plaque involving  the carotid bifurcation without stenosis The right CCA and ICA are patent  without stenosis.    Left carotid circulation: There is scattered atherosclerotic plaque along the  distal CCA without stenosis The left CCA and ICA are patent without stenosis.    Vertebral arteries: Patent without stenosis.    Subclavian arteries: Patent without stenosis.    Brachiocephalic trunk and aortic arch: Atherosclerosis without stenosis.    There is no visualized aneurysm or dissection.      Nonvascular findings:      There is no visualized cervical mass or adenopathy by size criteria.     The nasopharynx is unremarkable.     Pharyngeal and laryngeal structures appear unremarkable. There is suboptimal  visualization of the oral cavity due to streak artifact and beam hardening from  dental hardware, however the floor the mouth is unremarkable.     Glandular structures are unremarkable.     Visualized portions of the mediastinum are unremarkable.     Visualized portions of the lungs are clear.     The retropharyngeal space and superficial soft tissues are unremarkable.      Impression    IMPRESSION:    Head CTA:  1. No evidence of a large vessel occlusion.    2. Bilateral high-grade stenoses, moderate on left and mild on the right, as  detailed above.    Neck CTA:    No significant stenosis or occlusion identified.    Electronically signed by: Lillia Hug, MD 11/28/2023 4:09 PM EDT             Workstation ID: [id number]     XR CHEST PORTABLE    Narrative    History: Brief period of unresponsiveness with an inability to move the left  side. Symptoms since resolved.    Technique: A single AP portable upright portable chest radiograph is submitted  for interpretation, one radiographic view.    Comparison: Chest radiography, 04/21/2023.    Findings:    There is an elevated left hemidiaphragm with hyperattenuating ill-defined focus  possibly indicating stool at the underlying splenic flexure, less likely mass at  the left lung base. This can be further characterized with lateral radiography  in order to exclude a left lower lobe pathology.    Otherwise, the lungs are generally clear.    The heart is borderline enlarged with left atrial chamber prominence. There is a  tortuous, atherosclerotic thoracic aorta.      Impression    IMPRESSION:    Elevated left hemidiaphragm. Ill-defined density possibly representing stool in  the underlying splenic flexure versus left lower lobe opacity. Lateral  radiography would be useful in order to differentiate between these  possibilities.    Borderline cardiomegaly with left atrial chamber prominence. Tortuous,  atherosclerotic thoracic aorta.    Electronically signed by: Alm Conte, MD 11/28/2023 4:23 PM EDT            Workstation ID: [id number]  XR CHEST (2 VW)    Narrative    History:  Brief period of unresponsiveness with inability to move the left side.    Report:  PA and lateral chest radiographs are submitted for interpretation; 2  exposures and 2 radiographic views.    Comparison: 11/28/2023.    Findings:    Ill-defined dense peribronchiolar opacities noted in the left lower lobe  possibly indicating pneumonitis. Please correlate with auscultatory findings.  The lungs are otherwise clear without evidence of segmental or subsegmental  alveolar infiltrate.    The heart, pleural surfaces, and thoracic aorta appear unremarkable  radiographically.      Impression     IMPRESSION:    Dense peribronchiolar opacity left lower lobe concerning for bronchogenic  pneumonia. Please correlate with auscultatory findings.    Electronically signed by: Alm Conte, MD 11/28/2023 5:12 PM EDT            Workstation ID: [id number]     CBC with Auto Differential   Result Value Ref Range    WBC 5.6 4.0 - 11.0 1000/mm3    RBC 4.01 3.60 - 5.20 M/uL    Hemoglobin 11.4 11.0 - 16.0 gm/dl    Hematocrit 63.7 64.9 - 47.0 %    MCV 90.3 80.0 - 98.0 fL    MCH 28.4 25.4 - 34.6 pg    MCHC 31.5 30.0 - 36.0 gm/dl    Platelets 852 859 - 450 1000/mm3    MPV 10.5 (H) 6.0 - 10.0 fL    RDW 45.8 36.4 - 46.3      Nucleated RBCs 0 0 - 0      Immature Granulocytes % 0.5 0.0 - 3.0 %    Neutrophils Segmented 61.0 34 - 64 %    Lymphocytes 22.8 (L) 28 - 48 %    Monocytes 8.2 1 - 13 %    Eosinophils 6.8 (H) 0 - 5 %    Basophils 0.7 0 - 3 %   Comprehensive Metabolic Panel   Result Value Ref Range    Potassium 3.5 3.5 - 5.1 mEq/L    Chloride 104 98 - 107 mEq/L    Sodium 142 136 - 145 mEq/L    CO2 28 20 - 31 mEq/L    Glucose 183 (H) 74 - 106 mg/dl    BUN 17 9 - 23 mg/dl    Creatinine 8.71 (H) 0.55 - 1.02 mg/dl    GFR African American 51.0      GFR Non-African American 42      Calcium  9.5 8.7 - 10.4 mg/dl    Anion Gap 10 5 - 15 mmol/L    AST 166.0 (H) 0.0 - 33.9 U/L    ALT 326 (H) 10 - 49 U/L    Alkaline Phosphatase 110 46 - 116 U/L    Total Bilirubin 0.50 0.30 - 1.20 mg/dl    Total Protein 7.2 5.7 - 8.2 gm/dl    Albumin 3.3 (L) 3.4 - 5.0 gm/dl   Troponin   Result Value Ref Range    Troponin, High Sensitivity 66 (H) 0 - 34 ng/L   Protime-INR   Result Value Ref Range    Protime 12.3 10.2 - 12.9 seconds    INR 1.1 0.1 - 1.1     APTT   Result Value Ref Range    aPTT 25.2 25.1 - 36.5 seconds        Medications   amLODIPine  (NORVASC ) tablet 5 mg ( Oral Automatically Held 12/02/23 0900)  gabapentin  (NEURONTIN ) capsule 400 mg (has no administration in time range)   losartan  (COZAAR ) tablet 100 mg ( Oral Automatically Held 12/02/23  0900)   therapeutic multivitamin-minerals 1 tablet (has no administration in time range)   omega-3 acid ethyl esters (LOVAZA ) capsule 1 capsule (has no administration in time range)   pantoprazole  (PROTONIX ) tablet 40 mg (has no administration in time range)   oxyBUTYnin  (DITROPAN -XL) extended release tablet 10 mg (has no administration in time range)   vitamin B-12 (CYANOCOBALAMIN ) tablet 500 mcg (has no administration in time range)   Vitamin D  (CHOLECALCIFEROL ) tablet 1,000 Units (has no administration in time range)   sodium chloride  flush 0.9 % injection 5-40 mL (has no administration in time range)   sodium chloride  flush 0.9 % injection 5-40 mL (has no administration in time range)   0.9 % sodium chloride  infusion (has no administration in time range)   ondansetron  (ZOFRAN -ODT) disintegrating tablet 4 mg (has no administration in time range)     Or   ondansetron  (ZOFRAN ) injection 4 mg (has no administration in time range)   polyethylene glycol (GLYCOLAX ) packet 17 g (has no administration in time range)   enoxaparin  Sodium (LOVENOX ) injection 30 mg (has no administration in time range)   acetaminophen  (TYLENOL ) tablet 650 mg (has no administration in time range)     Or   acetaminophen  (TYLENOL ) suppository 650 mg (has no administration in time range)   atorvastatin  (LIPITOR ) tablet 80 mg (has no administration in time range)   clopidogrel  (PLAVIX ) tablet 75 mg (has no administration in time range)   aspirin  EC tablet 81 mg (has no administration in time range)   sodium chloride  0.9 % bolus 500 mL (has no administration in time range)   cefTRIAXone  (ROCEPHIN ) 1,000 mg in sodium chloride  0.9 % 50 mL IVPB (mini-bag) (has no administration in time range)   doxycycline  (VIBRAMYCIN ) 100 mg in sodium chloride  0.9 % 100 mL IVPB (mini-bag) (has no administration in time range)   iopamidol  (ISOVUE -300) 61 % IntraVENous 105 mL (105 mLs IntraVENous Given 11/28/23 1559)   aspirin  tablet 325 mg (325 mg Oral Given 11/28/23  1705)   clopidogrel  (PLAVIX ) tablet 300 mg (300 mg Oral Given 11/28/23 1705)       Procedures    My interpretation of imaging shows CTH no ICH by my interpretation, CTA without any LVO by my interpretation, chest x-ray without any    EKG shows sinus rhythm with a rate of 75 without any significant ST segment changes per my interpretation    Telemetry shows normal sinus rhythm on monitor by my interpretation    Medical Decision Making/ED Course        Medical Decision Making  Amount and/or Complexity of Data Reviewed  Labs: ordered.  Radiology: ordered.  ECG/medicine tests: ordered.    Risk  OTC drugs.  Prescription drug management.  Decision regarding hospitalization.        RECORD REVIEW:  I reviewed the patient's previous records here at [facility] and available outside facilities and note that reviewed ER visit from outside hospital on 08/18/2021, at that time patient evaluated for headache, she was evaluated with a CT head that did not show any bleeding    COMORBIDITIES impacting Evaluation and Management: see above    Severe exacerbation or progression of chronic illness:    Threat to body function without evaluation and management:      Social Determinants impacting Evaluation and Management:     Final Diagnosis  ICD-10-CM    1. Unresponsive episode  R40.4       2. Left-sided weakness  R53.1       3. Pneumonia due to infectious organism, unspecified laterality, unspecified part of lung  J18.9            Disposition   Admit    Odella Memo, MD  November 28, 2023  8:55 PM     *Portions of this electronic record were dictated using Dragon voice recognition software.  Unintended errors in translation may occur.    My signature above authenticates this document and my orders, the final    diagnosis (es), discharge prescription (s), and instructions in the Epic    record.  If you have any questions please contact [phone].     Nursing notes have been reviewed by the physician/ advanced practice     Clinician.               Memo Odella, MD  11/28/23 2102

## 2023-11-28 NOTE — ED Triage Notes (Signed)
 Pt brought in by medics, reported that patient had a brief period of unresponsiveness,she then would wake up to verbal stimuli and only able to move  R side.  S/s have since resolved.  PT is Aox3 on arrival    [id number]

## 2023-11-28 NOTE — Progress Notes (Addendum)
 Initial Stroke Team Assessment  Time Patient Arrived:   Time Stroke Activated:  Last known Time/Date at Baseline:  Physician at Bedside:   Tele-Neurologist Name:   Exclusion from TPA Reasons:   Time Thrombolytics Given:     Stroke symptoms: L side hemiparesis - resolved; confusion and dysarthria       11/28/23 1630   NIH Stroke Scale   NIH Stroke Scale Assessed Yes   Interval Reassessment   Level of Consciousness (1a) 0   LOC Questions (1b) 1   LOC Commands (1c) 0   Best Gaze (2) 0   Visual (3) 0   Facial Palsy (4) 0   Motor Arm, Left (5a) 0   Motor Arm, Right (5b) 0   Motor Leg, Left (6a) 0   Motor Leg, Right (6b) 0   Limb Ataxia (7) 0   Sensory (8) 0   Best Language (9) 1   Dysarthria (10) 1  (missing teeth - could contribute)   Extinction and Inattention (11) 0   Total 3     Pt arrived by EMS after having a witnessed brief episode of unconsciousness while with caregiver.  After EMS arrived, the Pt was arousable, but had L side hemiparesis. On arrival to the ER, the L side weakness had resolved.  NIH 3.  LKW 14:45.   The Pt was unable to answer LOC questions or find the words to name objects.  She also had mild slurred speech, but is missing some teeth which may have contributed to the slurring.  Pt has a hx of dementia, HTN, HLD, CKD stage II, DM, and GERD.  She is a very pleasant, happy person.  CT studies were completed and negative for acute intracranial findings.  The Pt will be admitted for further testing and treatment.      Swallow screen was deferred to bedside RN.    Stroke education brochure was provided to: patient and daughter - reinforcement needed after admission. Rationale for acute work-up of symptoms explained.  Possible treatments, such as tPA or intervention for ischemic strokes and the need for a quick work-up, have been reviewed.

## 2023-11-29 LAB — RESPIRATORY PATHOGEN PANEL
Adenovirus: NOT DETECTED
Bordetella Pertussis: NOT DETECTED
Chlamydophilia pneumoniae: NOT DETECTED
Coronavirus 299E: NOT DETECTED
Coronavirus HKU1: NOT DETECTED
Coronavirus NL63: NOT DETECTED
Coronavirus OC43: NOT DETECTED
Human Metapneumovirus: NOT DETECTED
INFLUENZA A H1: NOT DETECTED
Influenza A H3: NOT DETECTED
Influenza A PCR: NOT DETECTED
Influenza B PCR: NOT DETECTED
Influenza H1N1: NOT DETECTED
Mycoplasma pneumoniae: NOT DETECTED
Parainfluenza 1: NOT DETECTED
Parainfluenza 2: NOT DETECTED
Parainfluenza 3: NOT DETECTED
Parainfluenza: NOT DETECTED
RSV PCR: NOT DETECTED
Rhinovirus/Enterovirus: NOT DETECTED
SARS-CoV-2: NOT DETECTED

## 2023-11-29 LAB — MAGNESIUM: Magnesium: 1.9 mg/dL (ref 1.6–2.6)

## 2023-11-29 LAB — CBC
Hematocrit: 34.2 % — ABNORMAL LOW (ref 35.0–47.0)
Hemoglobin: 11 g/dL (ref 11.0–16.0)
MCH: 28.6 pg (ref 25.4–34.6)
MCHC: 32.2 g/dL (ref 30.0–36.0)
MCV: 88.8 fL (ref 80.0–98.0)
MPV: 10.7 fL — ABNORMAL HIGH (ref 6.0–10.0)
Platelets: 148 1000/mm3 (ref 140–450)
RBC: 3.85 M/uL (ref 3.60–5.20)
RDW: 44.9 (ref 36.4–46.3)
WBC: 5.3 1000/mm3 (ref 4.0–11.0)

## 2023-11-29 LAB — LIPID PANEL
Chol/HDL Ratio: 2.8 ratio (ref 0.0–4.4)
Cholesterol, Total: 106 mg/dL (ref 0–199)
HDL: 38 mg/dL — ABNORMAL LOW (ref 40–60)
LDL Cholesterol: 53 mg/dL (ref 0–130)
Triglycerides: 75 mg/dL (ref 0–150)

## 2023-11-29 LAB — EKG 12-LEAD
Atrial Rate: 75 {beats}/min
Calculated P Axis: 50 degrees
Calculated R Axis: -6 degrees
Calculated T Axis: 33 degrees
DIAGNOSIS, 93000: NORMAL
P-R Interval: 140 ms
Q-T Interval: 414 ms
QRS Duration: 126 ms
QTC Calculation (Bezet): 462 ms
Ventricular Rate: 75 {beats}/min

## 2023-11-29 LAB — BASIC METABOLIC PANEL W/ REFLEX TO MG FOR LOW K
Anion Gap: 10 mmol/L (ref 5–15)
BUN: 13 mg/dL (ref 9–23)
CO2: 27 meq/L (ref 20–31)
Calcium: 9 mg/dL (ref 8.7–10.4)
Chloride: 105 meq/L (ref 98–107)
Creatinine: 0.96 mg/dL (ref 0.55–1.02)
Glucose: 140 mg/dL — ABNORMAL HIGH (ref 74–106)
Potassium: 3.3 meq/L — ABNORMAL LOW (ref 3.5–5.1)
Sodium: 142 meq/L (ref 136–145)

## 2023-11-29 LAB — HEMOGLOBIN A1C: Hemoglobin A1C: 8.4 % — ABNORMAL HIGH (ref 3.8–5.6)

## 2023-11-29 LAB — POCT GLUCOSE: POC Glucose: 126 mg/dL — ABNORMAL HIGH (ref 65–105)

## 2023-11-29 LAB — MRSA BY PCR: MRSA PCR screen: NEGATIVE

## 2023-11-29 MED ORDER — LORAZEPAM 2 MG/ML IJ SOLN
2 | INTRAMUSCULAR | Status: DC | PRN
Start: 2023-11-29 — End: 2023-11-30

## 2023-11-29 MED ORDER — POTASSIUM CHLORIDE CRYS ER 20 MEQ PO TBCR
20 | Freq: Once | ORAL | Status: AC
Start: 2023-11-29 — End: 2023-11-29
  Administered 2023-11-29: 19:00:00 40 meq via ORAL

## 2023-11-29 MED ORDER — ENOXAPARIN SODIUM 40 MG/0.4ML IJ SOSY
40 | Freq: Every day | INTRAMUSCULAR | Status: DC
Start: 2023-11-29 — End: 2023-11-30
  Administered 2023-11-30: 14:00:00 40 mg via SUBCUTANEOUS

## 2023-11-29 MED FILL — CLOPIDOGREL BISULFATE 75 MG PO TABS: 75 mg | ORAL | Qty: 1

## 2023-11-29 MED FILL — CEFTRIAXONE SODIUM 1 G IJ SOLR: 1 g | INTRAMUSCULAR | Qty: 1000

## 2023-11-29 MED FILL — DOXY 100 100 MG IV SOLR: 100 mg | INTRAVENOUS | Qty: 100

## 2023-11-29 MED FILL — VITAMIN B-12 500 MCG PO TABS: 500 ug | ORAL | Qty: 1

## 2023-11-29 MED FILL — VITAMIN D3 25 MCG (1000 UT) PO TABS: 25 MCG (1000 UT) | ORAL | Qty: 1

## 2023-11-29 MED FILL — THERAPEUTIC-M PO TABS: ORAL | Qty: 1

## 2023-11-29 MED FILL — LOVENOX 30 MG/0.3ML IJ SOSY: 30 MG/0.3ML | INTRAMUSCULAR | Qty: 0.3

## 2023-11-29 MED FILL — OMEGA-3-ACID ETHYL ESTERS 1 G PO CAPS: 1 g | ORAL | Qty: 1

## 2023-11-29 MED FILL — OXYBUTYNIN CHLORIDE ER 5 MG PO TB24: 5 mg | ORAL | Qty: 2

## 2023-11-29 MED FILL — KLOR-CON M20 20 MEQ PO TBCR: 20 meq | ORAL | Qty: 2

## 2023-11-29 MED FILL — SODIUM CHLORIDE FLUSH 0.9 % IV SOLN: 0.9 % | INTRAVENOUS | Qty: 10

## 2023-11-29 MED FILL — POLYETHYLENE GLYCOL 3350 17 G PO PACK: 17 g | ORAL | Qty: 1

## 2023-11-29 MED FILL — ATORVASTATIN CALCIUM 40 MG PO TABS: 40 mg | ORAL | Qty: 2

## 2023-11-29 MED FILL — GABAPENTIN 400 MG PO CAPS: 400 mg | ORAL | Qty: 1

## 2023-11-29 MED FILL — PANTOPRAZOLE SODIUM 40 MG PO TBEC: 40 mg | ORAL | Qty: 1

## 2023-11-29 MED FILL — ASPIRIN LOW DOSE 81 MG PO TBEC: 81 mg | ORAL | Qty: 1

## 2023-11-29 NOTE — Plan of Care (Signed)
 Problem: Confusion  Goal: Confusion, delirium, dementia, or psychosis is improved or at baseline  Description: INTERVENTIONS:  1. Assess for possible contributors to thought disturbance, including medications, impaired vision or hearing, underlying metabolic abnormalities, dehydration, psychiatric diagnoses, and notify attending LIP  2. Institute high risk fall precautions, as indicated  3. Provide frequent short contacts to provide reality reorientation, refocusing and direction  4. Decrease environmental stimuli, including noise as appropriate  5. Monitor and intervene to maintain adequate nutrition, hydration, elimination, sleep and activity  6. If unable to ensure safety without constant attention obtain sitter and review sitter guidelines with assigned personnel  7. Initiate Psychosocial CNS and Spiritual Care consult, as indicated  Outcome: Progressing     Problem: Safety - Adult  Goal: Free from fall injury  11/29/2023 1315 by Edra Point, RN  Outcome: Progressing  11/29/2023 0440 by Anthon Reyes BROCKS, RN  Outcome: Progressing

## 2023-11-29 NOTE — Plan of Care (Signed)
 Problem: SLP Adult - Impaired Swallowing  Goal: Effective swallowing  Description: Short term Goals: 2-5x/wk x 1 week  Patient will:  1. Tolerate recommended diet with </= 1 s/sx aspiration b/s for adequate po nutrition/ hydration  2.  Caregiver to utilize compensatory swallow strategies to improve swallow safety and decrease risks of airway compromise independently   Outcome: Progressing

## 2023-11-29 NOTE — Plan of Care (Signed)
 Problem: Safety - Adult  Goal: Free from fall injury  Outcome: Progressing     Problem: Pain  Goal: Verbalizes/displays adequate comfort level or baseline comfort level  Outcome: Progressing

## 2023-11-29 NOTE — Progress Notes (Addendum)
 Page Sent        PAGER ID: [phone]  MESSAGE: Hi. Rm. 6626 Sharon Roman, Dx: TIA, is 177/130. Pls. advise. Reyes Gerlach RN [address] [place] 1153       2102 Received a call from Dr. Lillis, ordered one time hydralazine  5 mg iv for high BP.

## 2023-11-29 NOTE — Progress Notes (Signed)
 OCCUPATIONAL THERAPY SCREEN   Acknowledge Orders  Time  OT Charge Capture  Rehab Caseload Tracker     Patient: Sharon Roman [phone] y.o. female)  Room: 6626/6626    Primary Diagnosis: TIA (transient ischemic attack) [G45.9]       Date of Admission: 11/28/2023   Insurance: Payor: UHC MEDICARE / Plan: DREMA DUAL COMPLETE / Product Type: *No Product type* /      Date: 11/29/2023  Time: 8585-8569    Isolation:  No active isolations       MDRO: No active infections    Orders, labs, and chart reviewed on Sharon Roman. Discussed with RN and cleared to see patient.  SUBJECTIVE     Patient's reports no changes in her ability in regards to ADLs. She has a personal caregiver whom is with her during the day. She helps the patient with ADLs. Pt's daughter is with her in the evening and at night. There are no new changes that would warrant skilled OT services.     OBJECTIVE/ASSESSMENT     The role of Occupational Therapy was explained to the patient and that the evaluation would consist of assessing items such as; self-care, mobility, cognition and upper extremity skills. Patient presents at functional baseline this date for ADL/IADL performance. Patient in agreement to discontinue occupational therapy services at this time. If patient has a change in medical status or becomes medically appropriate for skilled occupational therapy, please re-order services. Thank you!     Modified Rankin Score (mRS): 3 - Moderate disability; requires some help but able to walk without (human) assistance - cane, walker etc.    Patient was admitted to the hospital on 11/28/2023 with   Chief Complaint   Patient presents with    Transient Ischemic Attack     Present illness history:   Patient Active Problem List    Diagnosis Date Noted    TIA (transient ischemic attack) 11/28/2023      Previous medical history: No past medical history on file.    Patient found: Bedside chair    OT SCREEN  Patient and/or caregiver was asked if patient was having  any difficulties or changes in their ability to perform the below activities since hospitalization.     ACTIVITY  Patient's Response   Do you feel there has been a change in your ability to perform basic ADLs (eating, grooming, bathing, dressing or toileting)? []   Yes, but feel they will return to baseline as they mobilize and their medical status improves    [x]   No, they are at baseline     Do you feel there has been a change in your ability to perform basic IADLs (cooking,cleaning, laundry, grocery shopping)?   []   Yes, but feel they will return to baseline as they mobilize and their medical status improve    [x]   No, they are at baseline     Do you feel there has been a change in your ability to perform functional mobility (getting in/out of bed, getting on/off commode, standing with ADLs)? []   Yes, but feel they will return to baseline as they mobilize and their medical status improves    [x]   No, they are at baseline       Do you feel there has been a change in your arm movements or strength?   []   Yes, but feel they will return to baseline as  their medical status improves    [x]   No, they are at baseline  Do you feel there has been a change in your cognition, memory or thinking? []   Yes, but feel they will return to baseline as  their medical status improves    [x]   No, they are at baseline       Do you feel you need Occupational Therapy?   []   Yes  [x]   No         Our service will defer full occupational therapy evaluation and sign-off.    STEPHANIE L LEMELLE, OTR/L

## 2023-11-29 NOTE — Care Coordination-Inpatient (Signed)
 [facility] Manager Initial Assessment        Home/Support/Situation prior to admission: pt currently lives with her daughter, current PCA services [facility]    Current family/patient discharge plan or goals of care: return home.        List DME prior to admission: shower chair, cane        Home address, phone number, insurance, emergency contacts verified:  yes        PCP:  Sharon Isabella POUR, MD     PCP Confirmed Active: yes                         Agreeable to [facility] for DC medications: no    Refill Pharmacy: [facility]                 Dialysis patient?  no     Active Medicaid?  yes               Is patient planning to go straight LTC at DC?  no      Agreeable to [facility] and Supportive Care if [facility] is recommended at DC (list alternate agency if agreeable to outside agency):  yes        Agreeable to post-acute care facility if recommended at DC? no    Signed:   Aastha Dayley JONELLE KURK, RN , Case Manager  November 29, 2023  1:51 PM  Department Phone: [phone]     11/29/23 1343   Service Assessment   Patient Orientation Alert and Oriented;Self   Cognition Alert   History Provided By Child/Family   Primary Caregiver Private caregiver  (PCA with [facility])   Accompanied By/Relationship duaghter, Sharon Roman   Support Systems Family Members;Home Care Staff   Patient's Healthcare Decision Maker is: Legal Next of Kin   PCP Verified by CM Yes   Last Visit to PCP Within last 3 months   Prior Functional Level Assistance with the following:;Bathing;Dressing;Toileting;Feeding;Mobility   Current Functional Level Assistance with the following:;Bathing;Dressing;Toileting;Feeding;Mobility   Can patient return to prior living arrangement Yes   Ability to make needs known: Good   Family able to assist with home care needs: Yes   Would you like for me to discuss the discharge plan with any other family members/significant others, and if so, who? Yes  (Sharon Roman)    [employer];Medicare   CM/SW Referral Disease Management Education   Social/Functional History   Lives With Spouse   Type of Home House   Home Layout Two level   Home Access Stairs to enter with rails   Entrance Stairs - Number of Steps 2   Bathroom Equipment Shower chair   Prior Level of Assist for ADLs Needs assistance   Prior Level of Assist for Homemaking Needs assistance   Ambulation Assistance Needs assistance   Prior Level of Assist for Transfers Needs assistance   Discharge Planning   Type of Residence House   Living Arrangements Children   Current Services Prior To Admission Durable Medical Equipment   Current DME Prior to [facility]   Potential Assistance Needed Durable Medical Equipment   Potential DME Needed Walker   Type of Home Care Services Aide Services   Patient expects to be discharged to: [employer] At/After Discharge   Transition of Care Consult (CM Consult) Other   Mode of Transport at Discharge Self  Confirm Follow Up Transport Family   Condition of Participation: Discharge Planning   The Patient and/or Patient Representative was provided with a Choice of Provider? Patient Representative   Name of the Patient Representative who was provided with the Choice of Provider and agrees with the Discharge Plan?  Sharon Roman   The Patient and/Or Patient Representative agree with the Discharge Plan? Yes   Freedom of Choice list was provided with basic dialogue that supports the patient's individualized plan of care/goals, treatment preferences, and shares the quality data associated with the providers?  Yes

## 2023-11-29 NOTE — Progress Notes (Signed)
 [facility] Enoxaparin : Renal Dose Adjustment   89 y.o. female MRN: [id number] Current Allergies. Sulfa antibiotics  The medication Enoxaparin  (Lovenox ) was automatically dose-adjusted per [facility] P&T Committee Protocol, with respect to renal function    Serum Creatinine Lab Results   Component Value Date/Time    CREATININE 0.96 11/29/2023 06:50 AM      Creatinine Clearance Estimated Creatinine Clearance: 37 mL/min (based on SCr of 0.96 mg/dL).   BUN Lab Results   Component Value Date/Time    BUN 13 11/29/2023 06:50 AM      Previous Enoxaparin  dose: Enoxaparin  30 mg SQ daily  New Enoxaparin  dose: Enoxaparin  40 mg SQ daily   Reason for adjustment: BMI less than 40 kg/m2 and CrCl > 30 mL/min    Pharmacy to continue to monitor patient daily. Will make dosage adjustments based upon changing renal function.    Thanks,  JOANE LITTIE CAIN, [facility] - INPATIENT PHARMACY   November 29, 2023    x [phone]

## 2023-11-29 NOTE — Progress Notes (Signed)
 Physical Therapy    PHYSICAL THERAPY EVALUATION     Acknowledge Orders  Time  PT Charge Capture  Rehab Caseload Tracker  [facility] AM-PAC 6 Clicks Basic Mobility Inpatient Short Form  -    Patient: Sharon Roman [phone] y.o. female)  Room: 6626/6626    Primary Diagnosis: TIA (transient ischemic attack) [G45.9]       Date of Admission: 11/28/2023   Length of Stay:  1 day(s)  Insurance: Payor: UHC MEDICARE / Plan: DREMA CAPES COMPLETE / Product Type: *No Product type* /      Date: 11/29/2023  Time In: 0822       Time Out: 0900   Total Minutes: 38  Treatment Time: 23 minutes    Isolation:  No active isolations       MDRO: No active infections  Current diet order: ADULT DIET; Dysphagia - Soft and Bite Sized; 4 carb choices (60 gm/meal); Low Fat/Low Chol/High Fiber/2 gm Na    Precautions: falls, decreased cognition   Ordered weight bearing status: no weight bearing restriction     ASSESSMENT:      Modified Rankin Score (mRS): 2 - Slight disability:  unable to carry out all previous activities, but able to look after own affairs without assistance.     Based on the objective data described below, the patient presents with  - good tolerance to activity  - neuro : no L sided weakness noted in L LE and pt opening L hand freely  - gait training x 60 feet in hallway with hand held assistance, CGA  - pleasantly confused  - pt's daughter reports that the patient is walking the same as she does at home    Patient's rehabilitation potential for below stated goals: good    Recommendations:  Recommend continued physical therapy during acute stay. Physical Therapy and Occupational Therapy. Recommend out of bed activity to counteract ill effects of bedrest, with assistance from staff as needed.  Discharge Recommendations: Return to familiar home environment and resume prior level of care.  Further Equipment Recommendations for Discharge: No DME needs; pt has DME     Functional Outcome Measure:    AM-PAC: AM-PAC  Inpatient Mobility Raw Score : 18  -  Home:   At this time and based on an AM-PAC score, no further PT is recommended upon discharge due to (i.e. patient at baseline functional status).  Recommend patient returns to prior setting with prior services.    This AMPAC score should be considered in conjunction with interdisciplinary team recommendations to determine the most appropriate discharge setting. Patient's social support, diagnosis, medical stability, and prior level of function should also be taken into consideration.      PRIOR LEVEL OF FUNCTION / HOME ENVIRONMENT:      Information was obtained by patient's daughter    Home environment:  Patient lives with her daughter in a 2 story home. Bedroom is on the first floor.  Pt has a full time caregiver during the day while her daughter is at work  Prior level of function: house hold ambulatory, has home health aide, 24 hour supervision  Home equipment: straight cane    PLAN:      Patient will benefit from skilled Physical Therapy intervention to address the above impairments to return to prior level of function. PT Plan of Care: 5 times/week.    Patient will achieve PT goals in 1 week. Goals were created with input from the patient's family.    PHYSICAL  THERAPY GOALS:     - Patient will be supervision with bed mobility in preparation for EOB activities.   - Patient will be supervision with transfers in preparation for OOB activities and ambulation.  - Patient will be standby assist for ambulating 100 feet with the least restrictive device to promote functional independence at home.   - Patient will increase FOM score to 20 in order to increase independence and safety with functional mobility.     PLANNED INTERVENTIONS:      Skilled Physical Therapy services will provide functional mobility training, therapeutic exercises, therapeutic activities, patient/caregiver education as indicated.  Skilled Physical Therapy services will modify and progress therapeutic  interventions, address functional mobility deficits, address ROM and strength deficits, analyze and cue movement patterns and assess and modify postural abnormalities to reach the stated goals.    COMMUNICATION/EDUCATION:     Education: OOB to chair for meals and mobilize as tolerated, call staff for assistance, safety, DME, disposition, role of PT, PT plan of care, all questions answered.    Education provided to: patient and patient's daughter  Opportunity for questions and clarification was provided.    Readiness to learn indicated by: verbalized understanding    Barriers to learning/limitations: none (daughter) / dementia (patient)    Comprehension: Patient communicated comprehension and Family/POA communicated comprehension      SUBJECTIVE:      Patient agreeable to work with PT  Patient reports 0/10 pain before treatment and 0/10 pain at conclusion of treatment.  Pain Location: none reported    OBJECTIVE DATA SUMMARY:      Orders, labs, and chart reviewed on [name]. Communicated with patient's nurse (patient ok to be seen by PT), patient's daughter at bedside.     Present illness history:   Patient Active Problem List    Diagnosis Date Noted    TIA (transient ischemic attack) 11/28/2023      Previous medical history: No past medical history on file.     PATIENT FOUND:     Semi reclined in bed. (+) bed alarm. (+) IV. (+) telemetry.    COGNITIVE STATUS:     Mental Status:  Oriented to and person   Communication:  task unrelated comments, tangential thought process   Follows commands:  follows one step commands/direction   General cognition:  impaired, diminished situational awareness, decreased deficit awareness, forgetful, easily distracted   Safety/Judgement:  impaired       EXTREMITIES ASSESSMENT:      Range Of Motion:  BUE: WFL  BLE: active range of motion is WFL.      Strength:    BUE: WFL  RLE: Strength is grossly graded as 4/5  LLE: Strength is grossly graded as 4/5    Sensation:  intact to light  touch    Tone  normal      THERAPEUTIC ACTIVITIES; FUNCTIONAL MOBILITY AND BALANCE STATUS:     Patient received/participated in Therapeutic Activities (23 minutes)     Bed mobility:    Sup->sit: min assist; with HOB slightly raised    Transfers:  Sit -> stand: contact guard/min assist; pt pulled on daughter's hands, as they do at home   Stand -> sit: standby assistance/contact guard assist; provided cueing for hand placement, technique, and safety     Gait Training:  60 feet out in hallway, contact guard/min assist  steady, and cueing for safety with turns, gait belt, hand held  assistance       Balance:   Static  sitting balance:          WFLs       Dynamic sitting balance:     WFLs       Static standing balance:      good      Dynamic standing balance: fair+           ACTIVITY TOLERANCE:     - good tolerance to activity during PT session  - HR 78 bpm  - vitals stable  - no apparent distress    FINAL LOCATION:     Seated in bed side chair, all needs within reach. Patient agrees to call for assistance. Positioned with pillows for comfort. LE elevated. (+) chair alarm. Nurse notified. Care partner notified. (+) daughter present.    Thank you for this referral.  Bernarda CHRISTELLA Ora, PT

## 2023-11-29 NOTE — Progress Notes (Signed)
 SPEECH LANGUAGE PATHOLOGY EVALUATION     Patient: Sharon Roman (89 y.o. female), [birth year]  Room: 6626/6626  Primary Diagnosis: TIA (transient ischemic attack) [G45.9]        Date of Admission: 11/28/2023  Length of Stay: 1 day(s)    Date: 11/29/2023  Time In: 1144  Time Out: 1157  Total Minutes: 13 minutes    Isolation:  No active isolations         PPE: gloves  Precautions:  universal    ASSESSMENT:   Orientation: Patient awake, alert, pleasant and confused (baseline)  Respiratory Status: Room Air    Evaluation: Clinical swallow evaluation completed at beside in setting of left sided weakness, unresponsive episode, TIA/stroke work up. Past medical hx includes patient has moderate dementia with daily caregiver. Per caregiver at bedside, patient eats a regular texture diet with some softer food offerings for breakfast. No difficulty swallowing at baseline reported. RN added patient did not have difficulty swallowing pills with water and applesauce; however, constant cueing needed for encouragement to take medication. Current diet is IDDSI 6 soft and bite size diet and IDDSI 0 thin liquids. Dx PO trials of thin liquids, puree, and solids were accepted. Oral phase of swallowing was grossly functional. Mild lingual residue was appreciated after solid trial; however, was cleared with cued liquid wash. No immediate or delayed overt clinical signs suggestive of airway violation occurred across all trials. Recommend IDDSI 7 easy to chew diet with IDDSI 0 thin liquids as tolerated. Patient would benefit from caregiver assistance and cueing throughout meals. Staff or caregiver presence is advised for meals. ST POC established. Our service will follow accordingly.     PLAN/ RECOMMENDATIONS:                 Diet Recommendations: IDDSI 7- easy to chew and IDDSI 0- thin  Compensatory Swallow Strategies: HOB upright or OOB as able, small bites/ sip, alternate solids/ liquids   Medication Administration: as tolerated   Aspiration  Precautions: assist with feeding as needed/set up as needed  Risk(s) for Aspiration: medical condition     Therapy Recommendations: Patient would benefit from initiation of SLP services for dysphagia 2-5x/ week to address goals per POC  Therapy Prognosis: good      FUNCTIONAL ASSESSMENT  AM-PAC: Cognitive Raw Score: 8  -  Home with home health:   Current research shows that an AM-PAC score of 18 or greater is associated with a discharge to the patient's home setting.  Based on an AM-PAC score and their current ADL deficits; it is recommended that the patient have 2-3 sessions per week of Speech Therapy at d/c to increase the patient's independence.    This AM-PAC score should be considered in conjunction with interdisciplinary team recommendations to determine the most appropriate discharge setting. Patient's social support, diagnosis, medical stability, and prior level of function should also be taken into consideration.    Discharge Recommendations: continue services upon discharge  Other Recommended Services: OT, PT, Dietitian     SUBJECTIVE    Patient Stated: Go on, have a sip.   Pain Level: no pain reported  no pain s/s observed     OBJECTIVE   Position: HOB upright  Dentition: limited  Voice:  WFL  Consistencies: thin single sips, serial sips, and via straw, regular and puree (IDDSI 4)  Oral Phase: bolus residual on tongue  Pharyngeal Phase: no immediate or delayed cough, throat clear, or change in vocal quality occurred following all trials  Esophageal  Phase: wfl for b/s swallow eval    Modified Rankin Score: Defer to PT/OT    GOALS    See Care Plan     EDUCATION/COMMUNICATION   Education Provided: POC and diet recommendations  Individual Educated: Patient and Family/Caregiver  Comprehension: No evidence of learning and Family/Caregiver communicated comprehension  Barriers to learning/limitations: Yes;  baseline dementia  Staff Education: Educated Nurse to POC and diet recommendations.      Safety:  Following session, pt call bell within reach and caregiver remained at bedside      Thank you for this referral,  Rosaline Been, M.A., CCC-SLP  Speech-Language Pathologist  Secure Chat or ext [phone]

## 2023-11-29 NOTE — Progress Notes (Signed)
 2D Echo with color doppler completed

## 2023-11-29 NOTE — Progress Notes (Signed)
 Medicine Progress Note    Patient: Sharon Roman   Age:  89 y.o.  DOA: 11/28/2023     LOS:  LOS: 1 day     Assessment/Plan   Left-sided weakness TIA versus CVA  Left lower lobe pneumonia, POA  Transaminitis, unclear etiology  Hypertension  Hyperlipidemia  CKD stage II  NIDDM 2 uncontrolled with hyperglycemia  GERD  Dementia, AAO x 1 baseline  Hypokalemia    - Aspirin  Plavix  high intensity statin  - MRI brain pending  - Echocardiogram pending  -Telemetry SR  - No LVO on CTA head and neck  - PT OT SLP evals  -Ceftriaxone  doxycycline  for left lower lobe pneumonia seen on chest x-ray, no oxygen requirement  -[place] monitoring protocol  -Replace potassium p.o. and recheck levels in the a.m.    Discussed with daughters at bedside  Subjective:   Patient seen and examined. No complaints, left-sided weakness has resolved no N/V, F/C, CP or SOB      Objective:   Visit Vitals  BP (!) 154/97   Pulse 65   Temp 97.5 F (36.4 C) (Oral)   Resp 15   Ht 1.6 m (5' 3)   Wt 63.7 kg (140 lb 8 oz)   SpO2 100%   BMI 24.89 kg/m       Physical Exam:  General appearance: alert, cooperative, no distress,     Medications Reviewed:  Current Facility-Administered Medications   Medication Dose Route Frequency    [Held by provider] amLODIPine  (NORVASC ) tablet 5 mg  5 mg Oral Daily    gabapentin  (NEURONTIN ) capsule 400 mg  400 mg Oral QAM    [Held by provider] losartan  (COZAAR ) tablet 100 mg  100 mg Oral Daily    therapeutic multivitamin-minerals 1 tablet  1 tablet Oral Daily    omega-3 acid ethyl esters (LOVAZA ) capsule 1 capsule  1 capsule Oral Daily    pantoprazole  (PROTONIX ) tablet 40 mg  40 mg Oral QAM AC    oxyBUTYnin  (DITROPAN -XL) extended release tablet 10 mg  10 mg Oral Daily    vitamin B-12 (CYANOCOBALAMIN ) tablet 500 mcg  500 mcg Oral Daily    Vitamin D  (CHOLECALCIFEROL ) tablet 1,000 Units  1,000 Units Oral QAM    sodium chloride  flush 0.9 % injection 5-40 mL  5-40 mL IntraVENous 2 times per day    sodium chloride  flush 0.9 %  injection 5-40 mL  5-40 mL IntraVENous PRN    0.9 % sodium chloride  infusion   IntraVENous PRN    ondansetron  (ZOFRAN -ODT) disintegrating tablet 4 mg  4 mg Oral Q8H PRN    Or    ondansetron  (ZOFRAN ) injection 4 mg  4 mg IntraVENous Q6H PRN    polyethylene glycol (GLYCOLAX ) packet 17 g  17 g Oral Daily PRN    enoxaparin  Sodium (LOVENOX ) injection 30 mg  30 mg SubCUTAneous Daily    acetaminophen  (TYLENOL ) tablet 650 mg  650 mg Oral Q4H PRN    Or    acetaminophen  (TYLENOL ) suppository 650 mg  650 mg Rectal Q4H PRN    atorvastatin  (LIPITOR ) tablet 80 mg  80 mg Oral Nightly    clopidogrel  (PLAVIX ) tablet 75 mg  75 mg Oral Daily    aspirin  EC tablet 81 mg  81 mg Oral Daily    cefTRIAXone  (ROCEPHIN ) 1,000 mg in sodium chloride  0.9 % 50 mL IVPB (mini-bag)  1,000 mg IntraVENous Q24H    doxycycline  (VIBRAMYCIN ) 100 mg in sodium chloride  0.9 % 100 mL IVPB (mini-bag)  100 mg IntraVENous Q12H         Intake and Output:  Current Shift:  No intake/output data recorded.  Last three shifts:  08/17 1901 - 08/19 0700  In: 670 [P.O.:120]  Out: -     Lab/Data Reviewed:  All lab and imaging  results for the last 24 hours reviewed.    GAITHER SHAUNNA ALY, MD  P# [phone]  November 29, 2023    [facility] medical dictation software was used for portions of this report.  Unintended voice transcription errors may have occurred.

## 2023-11-30 ENCOUNTER — Inpatient Hospital Stay: Admit: 2023-11-30 | Payer: Medicare (Managed Care) | Primary: Family Medicine

## 2023-11-30 LAB — MAGNESIUM: Magnesium: 1.8 mg/dL (ref 1.6–2.6)

## 2023-11-30 LAB — RENAL FUNCTION PANEL
Albumin: 3.6 g/dL (ref 3.4–5.0)
Anion Gap: 11 mmol/L (ref 5–15)
BUN: 13 mg/dL (ref 9–23)
CO2: 25 meq/L (ref 20–31)
Calcium: 9.5 mg/dL (ref 8.7–10.4)
Chloride: 105 meq/L (ref 98–107)
Creatinine: 0.94 mg/dL (ref 0.55–1.02)
GFR African American: >60
GFR Non-African American: 60
Glucose: 158 mg/dL — ABNORMAL HIGH (ref 74–106)
Phosphorus: 2.5 mg/dL (ref 2.4–5.1)
Potassium: 3.3 meq/L — ABNORMAL LOW (ref 3.5–5.1)
Sodium: 141 meq/L (ref 136–145)

## 2023-11-30 MED ORDER — CLOPIDOGREL BISULFATE 75 MG PO TABS
75 | ORAL_TABLET | Freq: Every day | ORAL | 0 refills | 30.00000 days | Status: AC
Start: 2023-11-30 — End: ?

## 2023-11-30 MED ORDER — DOXYCYCLINE HYCLATE 100 MG PO TABS
100 | ORAL_TABLET | Freq: Two times a day (BID) | ORAL | 0 refills | 7.00000 days | Status: AC
Start: 2023-11-30 — End: 2023-12-07

## 2023-11-30 MED ORDER — ASPIRIN 81 MG PO TBEC
81 | ORAL_TABLET | Freq: Every day | ORAL | 3 refills | 30.00000 days | Status: AC
Start: 2023-11-30 — End: ?

## 2023-11-30 MED ORDER — HYDRALAZINE HCL 20 MG/ML IJ SOLN
20 | Freq: Once | INTRAMUSCULAR | Status: AC
Start: 2023-11-30 — End: 2023-11-29
  Administered 2023-11-30: 02:00:00 5 mg via INTRAVENOUS

## 2023-11-30 MED FILL — ENOXAPARIN SODIUM 40 MG/0.4ML IJ SOSY: 40 MG/0.4ML | INTRAMUSCULAR | Qty: 0.4

## 2023-11-30 MED FILL — ATORVASTATIN CALCIUM 40 MG PO TABS: 40 mg | ORAL | Qty: 2

## 2023-11-30 MED FILL — HYDRALAZINE HCL 20 MG/ML IJ SOLN: 20 mg/mL | INTRAMUSCULAR | Qty: 1

## 2023-11-30 MED FILL — SODIUM CHLORIDE FLUSH 0.9 % IV SOLN: 0.9 % | INTRAVENOUS | Qty: 10

## 2023-11-30 NOTE — Progress Notes (Signed)
 Chaplain Services  Initial Visit    Start Time: 1136  End Time: 1141    Barrister's clerk conducted an Initial consultation and Spiritual Assessment for Sharon Roman, who is a 89 y.o.,female.  According to the patient's EMR Religious Affiliation is: [place].     No Family present.  Active listening.  Offered prayer and assurance of continued prayers on patient's behalf.   Chart reviewed.    The following outcomes where achieved:  Patient shared limited information about both their medical narrative and spiritual journey/beliefs.  Patient processed feelings about current hospitalization.  Patient expressed gratitude for chaplain's visit.    Assessment:  Patient does not have any religious/cultural needs that will affect patient's preferences in health care.  There are no spiritual or religious issues which require intervention at this time.     Plan:  Chaplains will continue to follow and will provide pastoral care on an as needed/requested basis.  Volunteer Chaplain recommends bedside caregivers page Duty Chaplain if patient shows signs of acute spiritual or emotional distress.    Makella Buckingham [employer]   Spiritual Care  [phone]

## 2023-11-30 NOTE — Progress Notes (Signed)
 Physical Therapy  PHYSICAL THERAPY TREATMENT:   Time  PT Charge Capture  Rehab Caseload Tracker -    Patient: Sharon Roman (89 y.o. female)  Room: 6626/6626    Primary Diagnosis: TIA (transient ischemic attack) [G45.9]       Length of Stay:  2 day(s)   Insurance: Payor: [employer] MEDICARE / Plan: DREMA DUAL COMPLETE / Product Type: *No Product type* /      Date: 11/30/2023  Time In: 0822       Time Out: 0900   Total Minutes: 38    Isolation:  No active isolations       MDRO: No active infections    Precautions: falls, confused   Ordered weight bearing status: no weight bearing restriction     ASSESSMENT:      Based on the objective data described below, the patient presents with  - good tolerance to activity  - gait training x 160 feet with HHA in hallway  - impaired memory affecting retention of education  - pleasantly confused  - pt's daughter very involved in pt's care and pt responds well to instructions from her    Pre Hospitalization Modified Rankin Score (mRS): 0 - No symptoms at all.   Modified Rankin Score (mRS): 2 - Slight disability:  unable to carry out all previous activities, but able to look after own affairs without assistance.     Recommendations:  Recommend continued physical therapy during acute stay. Physical Therapy. Recommend out of bed activity to counteract ill effects of bedrest, with assistance from staff as needed.  Discharge Recommendations: Return to familiar home environment and resume prior level of care.  Further Equipment Recommendations for Discharge: No DME needs.         PLAN:     Patient will be followed by physical therapy to address goals per initial plan of care.    COMMUNICATION/EDUCATION:     Education: benefits of activity, safety, activity pacing, disposition, role of PT, PT plan of care, all questions answered.    Education provided to: patient and patient's daughter  Opportunity for questions and clarification was provided.    Readiness to learn indicated by:  verbalized understanding    Barriers to learning/limitations: none    Comprehension: Patient communicated comprehension        SUBJECTIVE:      Patient agreeable to work with PT  Patient reports 0/10 pain before treatment and 0/10 pain at conclusion of treatment.  Pain Location: none reported or observed      OBJECTIVE DATA SUMMARY:      Orders, labs, and chart reviewed on Sharon Roman. Communicated with patient's nurse (patient ok to be seen by PT).     PATIENT FOUND:     Semi reclined in bed. (+) bed alarm. (+) IV. (+) telemetry. (+) daughter present    COGNITIVE STATUS:     Mental Status:  Oriented to and person   Communication:  task unrelated comments, random comments, tangential thought process   Follows commands:  follows one step commands/direction with some repetition, responds better to her daughter than to PT   General cognition:  impaired, easily distracted   Safety/Judgement:  impaired     THERAPEUTIC ACTIVITIES; FUNCTIONAL MOBILITY AND BALANCE STATUS:     Patient received/participated in Therapeutic Activities (38 minutes)     Bed mobility:    Sup->sit: min assist; with HOB slightly raised  Sit->supine:  supervision    Transfers:  Sit - stand: min assist; provided  cueing for hand placement, technique, and safety   Stand - sit: contact guard; provided cueing for hand placement, technique, and safety     Gait Training:  160 feet in hallway, contact guard/min assist shuffled steps, and cueing for normalizing gait, hand  held assistance    Balance:   Static sitting balance:          good       Dynamic sitting balance:     good       Static standing balance:      fair      Dynamic standing balance: fair          NEUROMUSCULAR RE-EDUCATION:       Balance activities:   Worked to improve reactive postural responses: stable surface, standing, weight shift during clothing management  Required B UE support and verbal cues for safety and technique.     ACTIVITY TOLERANCE:     - good tolerance to activity during PT  session  - vitals stable  - no apparent distress    FINAL LOCATION:     Seated in bed side chair, all needs within reach. Patient agrees to call for assistance. Positioned with pillows for comfort. LE elevated. (+) chair alarm. Nurse notified.    Thank you for this referral.  Sharon Roman, PT

## 2023-11-30 NOTE — Plan of Care (Signed)
 Problem: Safety - Adult  Goal: Free from fall injury  Outcome: Progressing     Problem: Pain  Goal: Verbalizes/displays adequate comfort level or baseline comfort level  Outcome: Progressing     Problem: ABCDS Injury Assessment  Goal: Absence of physical injury  Outcome: Progressing     Problem: Confusion  Goal: Confusion, delirium, dementia, or psychosis is improved or at baseline  Description: INTERVENTIONS:  1. Assess for possible contributors to thought disturbance, including medications, impaired vision or hearing, underlying metabolic abnormalities, dehydration, psychiatric diagnoses, and notify attending LIP  2. Institute high risk fall precautions, as indicated  3. Provide frequent short contacts to provide reality reorientation, refocusing and direction  4. Decrease environmental stimuli, including noise as appropriate  5. Monitor and intervene to maintain adequate nutrition, hydration, elimination, sleep and activity  6. If unable to ensure safety without constant attention obtain sitter and review sitter guidelines with assigned personnel  7. Initiate Psychosocial CNS and Spiritual Care consult, as indicated  Outcome: Progressing     Problem: Skin/Tissue Integrity  Goal: Skin integrity remains intact  Description: 1.  Monitor for areas of redness and/or skin breakdown  2.  Assess vascular access sites hourly  3.  Every 4-6 hours minimum:  Change oxygen saturation probe site  4.  Every 4-6 hours:  If on nasal continuous positive airway pressure, respiratory therapy assess nares and determine need for appliance change or resting period  Outcome: Progressing

## 2023-11-30 NOTE — Care Coordination-Inpatient (Signed)
 [facility]   Care Management Discharge Note    Patient Name: Sharon Roman     MRN: [id number]   Age: 89 y.o.   Sex: female                   Primary Care Physician Sharon Isabella POUR, MD  Unit/Room: 6626/6626  Admit Date: 11/28/2023   Length of stay: 2  Admitting Diagnosis: TIA (transient ischemic attack) [G45.9]   Primary Payer DREMA DUAL COMPLETE  Current Attending Physician: Sharon Gaither SQUIBB, MD    Discharge Date:  11/30/2023          IMM Given and Documented:  no  Was LTSS or PASSR Requested: no  Confirmed LTSS or PASSR uploaded to media manager: no    Discharge Plan: Pt will dc home with family support and PCA services that are in place.     CM met with pt and daughter, Sharon Roman, at bedside.  The family denied having any questions are concerns r/t dcp.  Daughter will transport.    D/C Medications sent to [facility] [address]  pharmacy      Method of Transportation and Time if scheduled:  Family will transport             Signed:   BASCOM Roman , Case Manager  November 30, 2023  3:49 PM  Department Phone: [phone]

## 2023-11-30 NOTE — Discharge Summary (Signed)
 DISCHARGE SUMMARY     Patient Name: Sharon Roman  Medical Record Number: [id number]  Date of Birth: February 22, 1934  Discharge Provider: GAITHER SHAUNNA ALY, MD  Primary Care Provider: [name], MD    Admit date: 11/28/2023  Discharge date: 12/05/2023  Discharge Disposition: Home  Code Status: Prior     Follow-up Information     [name], MD Follow up.    Specialty: Family Medicine  Contact information:  [address]  [place] [address]  [phone]                       Follow-up recommendations:  Primary medical doctor consider repeating chest x-ray several weeks to ensure resolution of infiltrate on admission chest x-ray    Discharge diagnosis:  TIA with left-sided weakness  Left lower lobe pneumonia, POA  Transaminitis, unclear etiology  Hypertension  Hyperlipidemia  CKD stage II  NIDDM 2 uncontrolled with hyperglycemia  GERD  Dementia, AAO x 1 baseline  Hypokalemia     Patient presented emergency department after episode of loss of consciousness upon awakening was noted to be not moving left side EMS was called patient was taken to the emergency department where on arrival she was obtunded but began coming around she still had left arm weakness and leg weakness at that time had only lasted a short time before returning to baseline.  Patient was seen by teleneurology and admitted to the medicine service for additional workup and treatment.  In the emergency department CTA head and neck was without LVO.  Patient was sent for MRI of the brain but she was too agitated and only few images were obtained but no findings to suggest CVA.  Her telemetry remained NSR and echocardiogram was unremarkable.  Suspect TIA.  patient will be discharged on aspirin  and 21-day Plavix  as well as high intensity statin.  On admission in the emergency department patient was noted to have a left lower lobe infiltrate and started on antibiotics.  Should follow-up with PMD as outpatient and have imaging repeated to ensure  resolution      History of presenting illness: as per admitting physician                  Additional diagnoses :   No past medical history on file.                                                     Discharge Medications:   Discharge Medication List as of 11/30/2023  5:00 PM        START taking these medications    Details   aspirin  81 MG EC tablet Take 1 tablet by mouth daily, Disp-30 tablet, R-3Normal      clopidogrel  (PLAVIX ) 75 MG tablet Take 1 tablet by mouth daily, Disp-20 tablet, R-0Normal      doxycycline  hyclate (VIBRA -TABS) 100 MG tablet Take 1 tablet by mouth 2 times daily for 7 days, Disp-10 tablet, R-0Normal           CONTINUE these medications which have NOT CHANGED    Details   amLODIPine  (NORVASC ) 5 MG tablet Take 1 tablet by mouth dailyHistorical Med      donepezil (ARICEPT) 5 MG tablet Take 1 tablet by mouth nightlyHistorical Med      hydrOXYzine HCl (  ATARAX) 25 MG tablet Take 1 tablet by mouth in the morning and at bedtimeHistorical Med      losartan  (COZAAR ) 100 MG tablet Take 1 tablet by mouth dailyHistorical Med      oxyBUTYnin  (DITROPAN -XL) 10 MG extended release tablet Take 1 tablet by mouth dailyHistorical Med      simvastatin (ZOCOR) 40 MG tablet Take 1 tablet by mouth nightlyHistorical Med      hydrocortisone 2.5 % cream Apply topically 2 times daily Apply sparingly to affected area 2 to 4 times daily., Topical, 2 TIMES DAILY, Historical Med      Omega-3 Fatty Acids (FISH OIL) 1000 MG capsule Take 1 capsule by mouth dailyHistorical Med      Multiple Vitamins-Minerals (CENTRUM SILVER WOMEN 50+) TABS Take 1 tablet by mouth dailyHistorical Med      gabapentin  (NEURONTIN ) 400 MG capsule Take 1 capsule by mouth every morning. Max Daily Amount: 400 mgHistorical Med      omeprazole (PRILOSEC) 20 MG delayed release capsule Take 1 capsule by mouth every morningHistorical Med      linagliptin (TRADJENTA) 5 MG tablet Take 1 tablet by mouth every morningHistorical Med      vitamin B-12  (CYANOCOBALAMIN ) 500 MCG tablet Take 1 tablet by mouth dailyHistorical Med      vitamin D  (CHOLECALCIFEROL ) 25 MCG (1000 UT) TABS tablet Take 1 tablet by mouth every morningHistorical Med      loratadine (CLARITIN) 10 MG tablet Take 1 tablet by mouth dailyHistorical Med      docusate sodium (COLACE) 100 MG capsule Take 1 capsule by mouth dailyHistorical Med             Discharge Diet:  Diet: regular diet    Consultants: none    Labs:  Labs: Results:       Chemistry No results for input(s): GLU, NA, K, CL, CO2, BUN, TP, GLOB in the last 72 hours.    Invalid input(s): CREA, CA, AGAP, BUCR, TBIL, GPT, AP, ALB, AGRAT     CBC w/Diff No results for input(s): WBC, RBC, HGB, HCT, PLT in the last 72 hours.    Invalid input(s): GRANS, LYMPH, EOS     Lipid Panel Lab Results   Component Value Date/Time    CHOL 106 11/29/2023 06:50 AM    HDL 38 11/29/2023 06:50 AM    LDL 53 11/29/2023 06:50 AM      Liver Enzymes No results for input(s): TP in the last 72 hours.    Invalid input(s): ALB, TBIL, AP, SGOT, GPT, DBIL   Thyroid Studies No results found for: T4, T3RU, TSH           Imaging / Diagnostic studies:   MRI brain without contrast  Order: [id number]   Status: Final result       Next appt: None    Test Result Released: No    0 Result Notes  Details    Reading Physician Reading Date Result Priority   Rosalie Contras, MD  [phone]  11/30/2023      Narrative & Impression  EXAMINATION: MRI brain without contrast     MEDICAL HISTORY: Left-sided weakness suspected CVA     TECHNIQUE: Multisequence, multiplanar MRI of the brain without contrast.     COMPARISONS: 11/28/2023.     WORKSTATION ID: [id number]     IMPRESSION:  Findings/impression:  1. Unable to follow instructions. Only 2 sequences were obtained, which  demonstrates significant degradation due to motion.  2. No obvious restricted diffusion to  suggest acute infarct.  3. Diffuse parenchymal volume loss  and chronic small vessel ischemic change.     Electronically signed by: Chevis Kid, MD 11/30/2023 1:55 PM EDT            Workstation ID: [id number]           Exam Ended: 11/30/23 13:40 EDT       MRI brain without contrast  Order: [id number]   Status: Final result       Next appt: None    Test Result Released: No    0 Result Notes  Details    Reading Physician Reading Date Result Priority   Kid Chevis, MD  [phone]  11/30/2023      Narrative & Impression  EXAMINATION: MRI brain without contrast     MEDICAL HISTORY: Left-sided weakness suspected CVA     TECHNIQUE: Multisequence, multiplanar MRI of the brain without contrast.     COMPARISONS: 11/28/2023.     WORKSTATION ID: [id number]     IMPRESSION:  Findings/impression:  1. Unable to follow instructions. Only 2 sequences were obtained, which  demonstrates significant degradation due to motion.  2. No obvious restricted diffusion to suggest acute infarct.  3. Diffuse parenchymal volume loss and chronic small vessel ischemic change.     Electronically signed by: Chevis Kid, MD 11/30/2023 1:55 PM EDT            Workstation ID: [id number]           Exam Ended: 11/30/23 13:40 EDT           Physical exam: not performed on day of discharge     Discharge condition: Stable    Discharge activity and restrictions: Activity as tolerated    Time spent with discharging patient: 99239- total time spent 35 min      Jerid Catherman SHAUNNA ALY, MD  12/05/2023  6:39 PM    [employer] medical dictation software was used for portions of this report.  Unintended voice transcription errors may have occurred.

## 2023-11-30 NOTE — Plan of Care (Signed)
 Problem: Safety - Adult  Goal: Free from fall injury  Outcome: Progressing     Problem: Pain  Goal: Verbalizes/displays adequate comfort level or baseline comfort level  Outcome: Progressing     Problem: ABCDS Injury Assessment  Goal: Absence of physical injury  Outcome: Progressing

## 2023-12-05 ENCOUNTER — Encounter: Admit: 2023-12-05 | Payer: Medicare (Managed Care) | Primary: Family Medicine

## 2024-01-31 ENCOUNTER — Encounter: Admit: 2024-01-31 | Payer: Medicare (Managed Care) | Primary: Family Medicine

## 2024-05-04 ENCOUNTER — Encounter: Admit: 2024-05-04 | Payer: Medicare (Managed Care) | Primary: Family Medicine
